# Patient Record
Sex: Female | Born: 1935 | Race: Black or African American | Hispanic: No | Smoking: Former smoker
Health system: Southern US, Community
[De-identification: ages and names within clinical notes are randomized; demographics above are authoritative.]

## PROBLEM LIST (undated history)

## (undated) DIAGNOSIS — I2510 Atherosclerotic heart disease of native coronary artery without angina pectoris: Secondary | ICD-10-CM

## (undated) DIAGNOSIS — I4891 Unspecified atrial fibrillation: Secondary | ICD-10-CM

## (undated) DIAGNOSIS — E119 Type 2 diabetes mellitus without complications: Secondary | ICD-10-CM

## (undated) DIAGNOSIS — I509 Heart failure, unspecified: Secondary | ICD-10-CM

## (undated) DIAGNOSIS — G309 Alzheimer's disease, unspecified: Secondary | ICD-10-CM

## (undated) DIAGNOSIS — J449 Chronic obstructive pulmonary disease, unspecified: Secondary | ICD-10-CM

## (undated) DIAGNOSIS — R32 Unspecified urinary incontinence: Secondary | ICD-10-CM

## (undated) DIAGNOSIS — J309 Allergic rhinitis, unspecified: Secondary | ICD-10-CM

## (undated) DIAGNOSIS — K5790 Diverticulosis of intestine, part unspecified, without perforation or abscess without bleeding: Secondary | ICD-10-CM

## (undated) DIAGNOSIS — L309 Dermatitis, unspecified: Secondary | ICD-10-CM

## (undated) DIAGNOSIS — E785 Hyperlipidemia, unspecified: Secondary | ICD-10-CM

## (undated) DIAGNOSIS — I429 Cardiomyopathy, unspecified: Secondary | ICD-10-CM

## (undated) DIAGNOSIS — I10 Essential (primary) hypertension: Secondary | ICD-10-CM

## (undated) DIAGNOSIS — F0280 Dementia in other diseases classified elsewhere without behavioral disturbance: Secondary | ICD-10-CM

## (undated) DIAGNOSIS — F0390 Unspecified dementia without behavioral disturbance: Secondary | ICD-10-CM

## (undated) HISTORY — PX: HYSTEROTOMY: SHX1776

## (undated) HISTORY — PX: OTHER SURGICAL HISTORY: SHX169

## (undated) HISTORY — PX: PACEMAKER PLACEMENT: SHX43

---

## 2003-08-29 ENCOUNTER — Other Ambulatory Visit: Payer: Self-pay

## 2003-10-19 ENCOUNTER — Other Ambulatory Visit: Payer: Self-pay

## 2003-11-14 ENCOUNTER — Other Ambulatory Visit: Payer: Self-pay

## 2003-11-17 ENCOUNTER — Other Ambulatory Visit: Payer: Self-pay

## 2003-12-25 ENCOUNTER — Other Ambulatory Visit: Payer: Self-pay

## 2004-02-16 ENCOUNTER — Emergency Department: Payer: Self-pay | Admitting: Emergency Medicine

## 2004-06-04 ENCOUNTER — Emergency Department: Payer: Self-pay | Admitting: General Practice

## 2004-12-15 ENCOUNTER — Emergency Department: Payer: Self-pay | Admitting: Emergency Medicine

## 2005-01-06 ENCOUNTER — Emergency Department: Payer: Self-pay | Admitting: Emergency Medicine

## 2005-01-06 ENCOUNTER — Other Ambulatory Visit: Payer: Self-pay

## 2005-01-12 ENCOUNTER — Emergency Department: Payer: Self-pay | Admitting: Emergency Medicine

## 2005-01-12 ENCOUNTER — Other Ambulatory Visit: Payer: Self-pay

## 2005-02-05 ENCOUNTER — Emergency Department: Payer: Self-pay | Admitting: Emergency Medicine

## 2005-02-05 ENCOUNTER — Other Ambulatory Visit: Payer: Self-pay

## 2005-02-19 ENCOUNTER — Emergency Department: Payer: Self-pay | Admitting: Emergency Medicine

## 2005-02-19 ENCOUNTER — Other Ambulatory Visit: Payer: Self-pay

## 2005-03-26 ENCOUNTER — Ambulatory Visit: Payer: Self-pay | Admitting: Family Medicine

## 2005-04-03 ENCOUNTER — Other Ambulatory Visit: Payer: Self-pay

## 2005-04-03 ENCOUNTER — Emergency Department: Payer: Self-pay | Admitting: Emergency Medicine

## 2005-05-16 ENCOUNTER — Other Ambulatory Visit: Payer: Self-pay

## 2005-05-16 ENCOUNTER — Emergency Department: Payer: Self-pay | Admitting: Emergency Medicine

## 2005-09-16 ENCOUNTER — Emergency Department: Payer: Self-pay | Admitting: Emergency Medicine

## 2005-09-17 ENCOUNTER — Other Ambulatory Visit: Payer: Self-pay

## 2005-12-15 ENCOUNTER — Other Ambulatory Visit: Payer: Self-pay

## 2005-12-15 ENCOUNTER — Emergency Department: Payer: Self-pay | Admitting: Unknown Physician Specialty

## 2006-03-19 ENCOUNTER — Ambulatory Visit: Payer: Self-pay | Admitting: Family Medicine

## 2006-10-02 ENCOUNTER — Other Ambulatory Visit: Payer: Self-pay

## 2006-10-02 ENCOUNTER — Inpatient Hospital Stay: Payer: Self-pay | Admitting: Internal Medicine

## 2007-01-14 ENCOUNTER — Emergency Department: Payer: Self-pay | Admitting: Emergency Medicine

## 2007-03-22 ENCOUNTER — Emergency Department: Payer: Self-pay | Admitting: Internal Medicine

## 2007-03-22 ENCOUNTER — Other Ambulatory Visit: Payer: Self-pay

## 2007-07-15 IMAGING — CR DG CHEST 2V
1 series · 2 of 2 positions shown · non-contrast
Comparison: none

REASON FOR EXAM: Pain
COMMENTS:

[Series 1241: postero_anterior · 0.11mm/px · 2 of 2 slices shown]
[im 1/2]
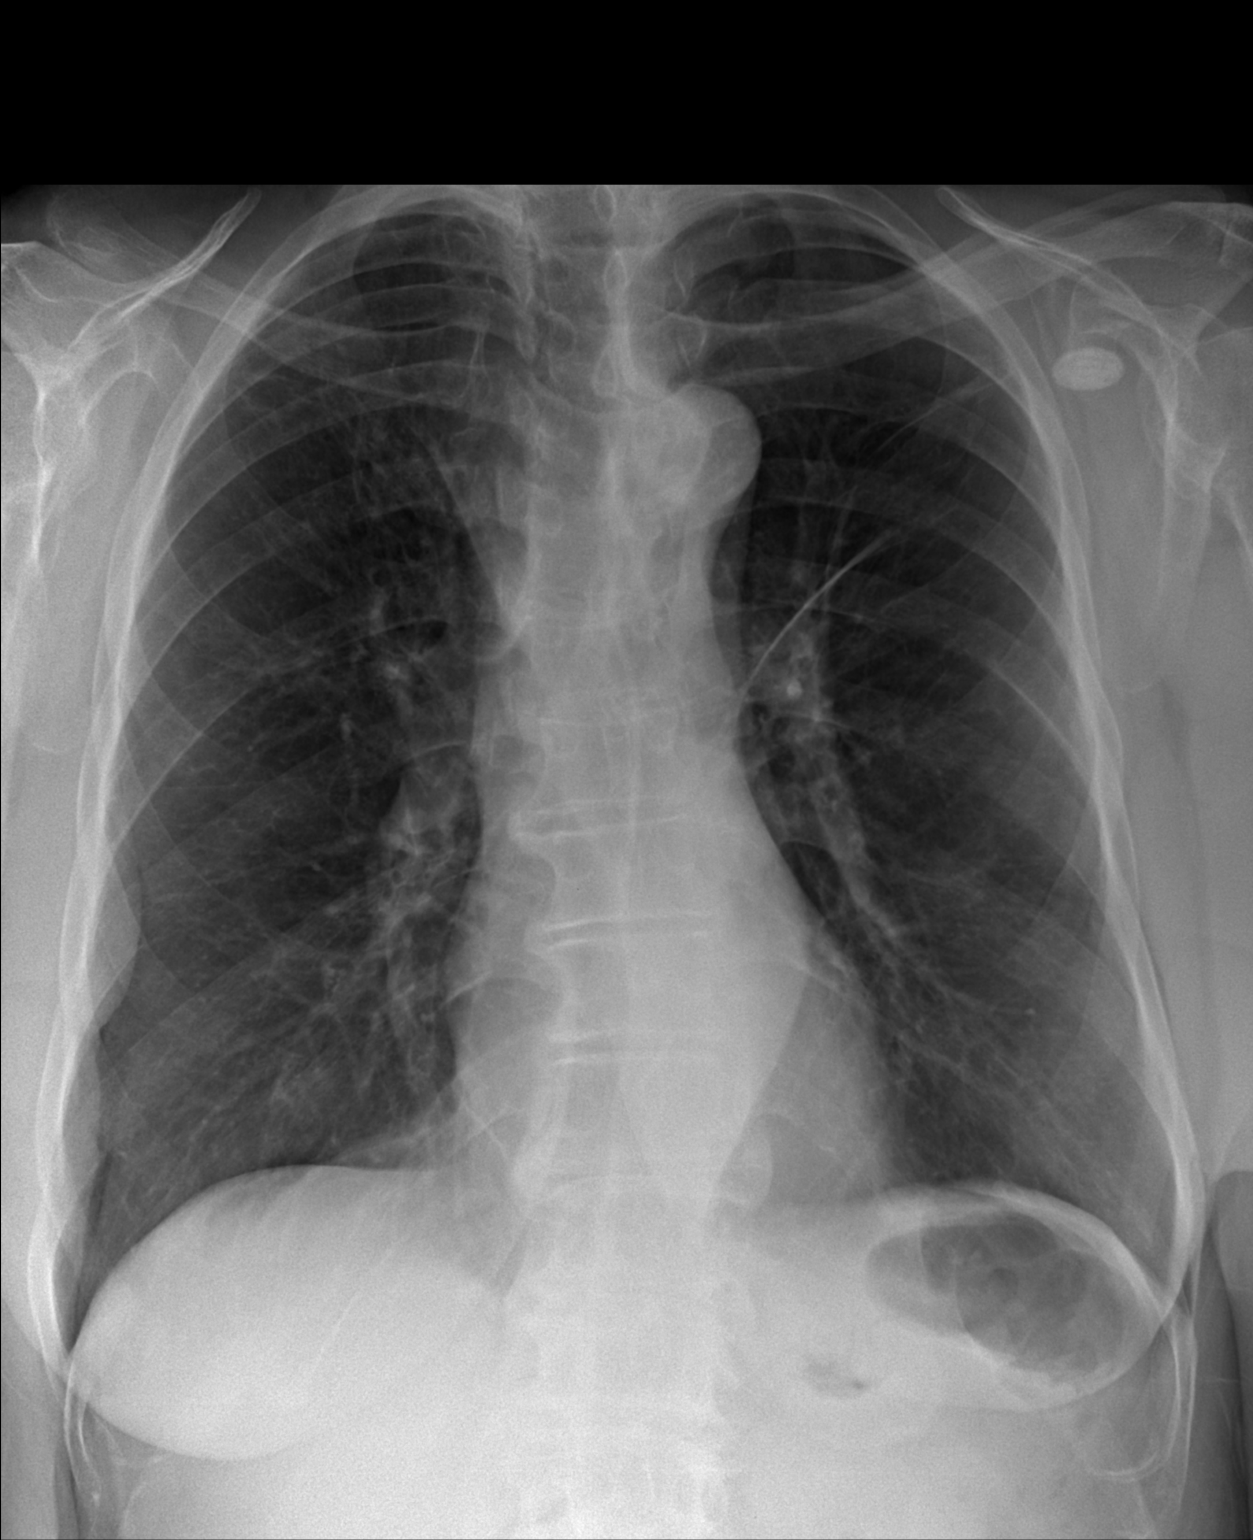
[im 2/2]
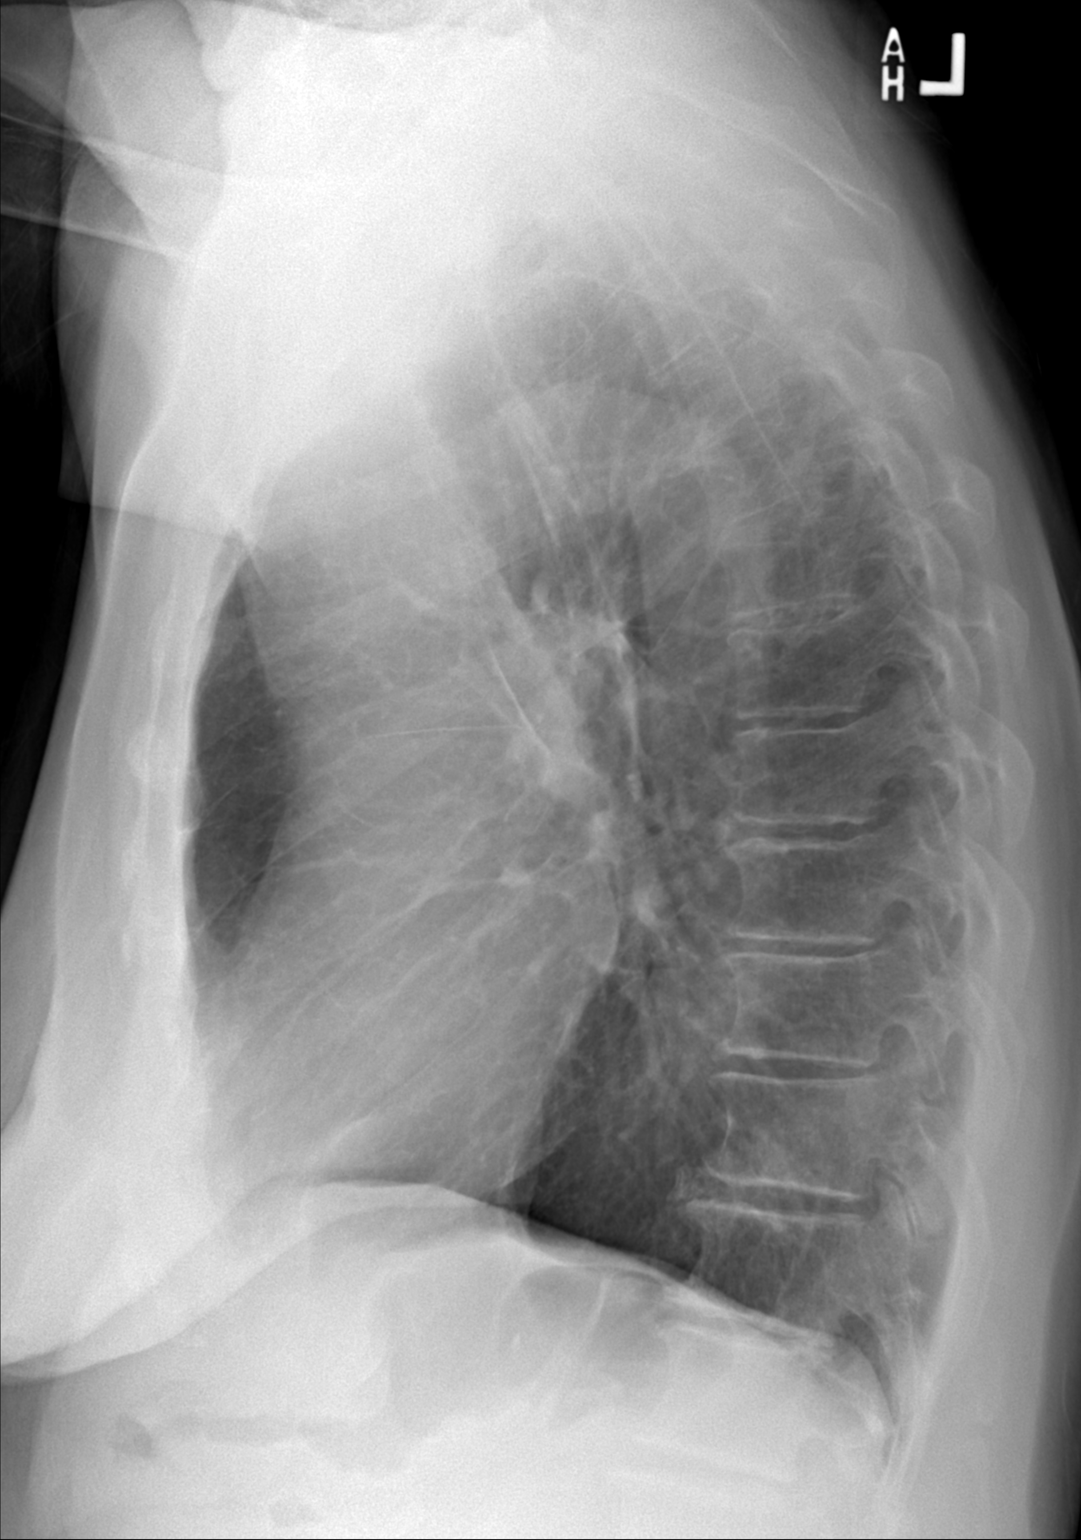

[2 of 2 positions shown; findings below may reference images not displayed]

PROCEDURE:     DXR - DXR CHEST PA (OR AP) AND LATERAL  - January 06, 2005  [DATE]

RESULT:     The current exam is compared to a prior exam of 02/16/2004.

There are again noted linear densities in the LEFT upper lobe compatible
with fibrotic strands. The lungs appear mildly hyperexpanded suspicious for
a history of COPD or asthma. No acute pulmonary infiltrates are seen. There
is noted soft tissue density along the RIGHT lateral hemithorax but this is
thought to be secondary to soft tissue from the arm which is hanging
adjacent to the chest. The heart size is normal. No acute bony abnormalities
are seen.
IMPRESSION: No acute changes are identified.

## 2008-03-02 ENCOUNTER — Emergency Department: Payer: Self-pay | Admitting: Emergency Medicine

## 2008-10-30 ENCOUNTER — Emergency Department: Payer: Self-pay | Admitting: Emergency Medicine

## 2008-12-26 ENCOUNTER — Inpatient Hospital Stay: Payer: Self-pay | Admitting: Internal Medicine

## 2009-01-12 ENCOUNTER — Emergency Department: Payer: Self-pay | Admitting: Internal Medicine

## 2009-01-19 ENCOUNTER — Emergency Department: Payer: Self-pay | Admitting: Emergency Medicine

## 2009-03-04 ENCOUNTER — Emergency Department: Payer: Self-pay | Admitting: Emergency Medicine

## 2009-07-17 ENCOUNTER — Emergency Department: Payer: Self-pay | Admitting: Internal Medicine

## 2009-08-27 ENCOUNTER — Emergency Department: Payer: Self-pay | Admitting: Emergency Medicine

## 2010-04-08 ENCOUNTER — Ambulatory Visit: Payer: Self-pay | Admitting: Family

## 2010-05-28 ENCOUNTER — Ambulatory Visit: Payer: Self-pay | Admitting: Family

## 2010-08-29 ENCOUNTER — Inpatient Hospital Stay: Payer: Self-pay | Admitting: Psychiatry

## 2011-07-21 ENCOUNTER — Ambulatory Visit: Payer: Self-pay | Admitting: Orthopedic Surgery

## 2012-09-16 ENCOUNTER — Ambulatory Visit: Payer: Self-pay | Admitting: Family Medicine

## 2013-04-09 ENCOUNTER — Emergency Department: Payer: Self-pay | Admitting: Emergency Medicine

## 2013-06-23 ENCOUNTER — Ambulatory Visit: Payer: Self-pay | Admitting: Family Medicine

## 2013-09-25 ENCOUNTER — Emergency Department: Payer: Self-pay | Admitting: Emergency Medicine

## 2013-10-06 ENCOUNTER — Ambulatory Visit: Payer: Self-pay | Admitting: Family Medicine

## 2013-10-10 ENCOUNTER — Ambulatory Visit: Payer: Self-pay | Admitting: Family Medicine

## 2014-02-05 ENCOUNTER — Ambulatory Visit: Payer: Self-pay | Admitting: Family Medicine

## 2015-12-09 ENCOUNTER — Observation Stay (HOSPITAL_BASED_OUTPATIENT_CLINIC_OR_DEPARTMENT_OTHER)
Admit: 2015-12-09 | Discharge: 2015-12-09 | Disposition: A | Discharge status: Home / Self Care | Payer: Medicare Other | Attending: Internal Medicine | Admitting: Internal Medicine

## 2015-12-09 ENCOUNTER — Emergency Department: Payer: Medicare Other

## 2015-12-09 ENCOUNTER — Encounter: Payer: Self-pay | Admitting: Emergency Medicine

## 2015-12-09 ENCOUNTER — Observation Stay
Admission: EM | Admit: 2015-12-09 | Discharge: 2015-12-11 | Payer: Medicare Other | Attending: Internal Medicine | Admitting: Internal Medicine

## 2015-12-09 DIAGNOSIS — Z7901 Long term (current) use of anticoagulants: Secondary | ICD-10-CM | POA: Insufficient documentation

## 2015-12-09 DIAGNOSIS — I700 Atherosclerosis of aorta: Secondary | ICD-10-CM | POA: Insufficient documentation

## 2015-12-09 DIAGNOSIS — Z90710 Acquired absence of both cervix and uterus: Secondary | ICD-10-CM | POA: Diagnosis not present

## 2015-12-09 DIAGNOSIS — I110 Hypertensive heart disease with heart failure: Secondary | ICD-10-CM | POA: Diagnosis not present

## 2015-12-09 DIAGNOSIS — Z7984 Long term (current) use of oral hypoglycemic drugs: Secondary | ICD-10-CM | POA: Diagnosis not present

## 2015-12-09 DIAGNOSIS — E785 Hyperlipidemia, unspecified: Secondary | ICD-10-CM | POA: Insufficient documentation

## 2015-12-09 DIAGNOSIS — I480 Paroxysmal atrial fibrillation: Secondary | ICD-10-CM | POA: Insufficient documentation

## 2015-12-09 DIAGNOSIS — Z7952 Long term (current) use of systemic steroids: Secondary | ICD-10-CM | POA: Insufficient documentation

## 2015-12-09 DIAGNOSIS — M7989 Other specified soft tissue disorders: Secondary | ICD-10-CM

## 2015-12-09 DIAGNOSIS — M25561 Pain in right knee: Secondary | ICD-10-CM | POA: Diagnosis not present

## 2015-12-09 DIAGNOSIS — Z950 Presence of cardiac pacemaker: Secondary | ICD-10-CM | POA: Insufficient documentation

## 2015-12-09 DIAGNOSIS — J449 Chronic obstructive pulmonary disease, unspecified: Secondary | ICD-10-CM | POA: Diagnosis not present

## 2015-12-09 DIAGNOSIS — Z79899 Other long term (current) drug therapy: Secondary | ICD-10-CM | POA: Insufficient documentation

## 2015-12-09 DIAGNOSIS — Z66 Do not resuscitate: Secondary | ICD-10-CM | POA: Diagnosis not present

## 2015-12-09 DIAGNOSIS — F0280 Dementia in other diseases classified elsewhere without behavioral disturbance: Secondary | ICD-10-CM | POA: Diagnosis not present

## 2015-12-09 DIAGNOSIS — E119 Type 2 diabetes mellitus without complications: Secondary | ICD-10-CM | POA: Diagnosis not present

## 2015-12-09 DIAGNOSIS — R000 Tachycardia, unspecified: Secondary | ICD-10-CM

## 2015-12-09 DIAGNOSIS — I471 Supraventricular tachycardia: Principal | ICD-10-CM | POA: Insufficient documentation

## 2015-12-09 DIAGNOSIS — R079 Chest pain, unspecified: Secondary | ICD-10-CM | POA: Diagnosis not present

## 2015-12-09 DIAGNOSIS — G309 Alzheimer's disease, unspecified: Secondary | ICD-10-CM | POA: Insufficient documentation

## 2015-12-09 DIAGNOSIS — I429 Cardiomyopathy, unspecified: Secondary | ICD-10-CM | POA: Insufficient documentation

## 2015-12-09 DIAGNOSIS — K449 Diaphragmatic hernia without obstruction or gangrene: Secondary | ICD-10-CM | POA: Diagnosis not present

## 2015-12-09 DIAGNOSIS — I2510 Atherosclerotic heart disease of native coronary artery without angina pectoris: Secondary | ICD-10-CM | POA: Insufficient documentation

## 2015-12-09 DIAGNOSIS — Z8249 Family history of ischemic heart disease and other diseases of the circulatory system: Secondary | ICD-10-CM | POA: Diagnosis not present

## 2015-12-09 DIAGNOSIS — J309 Allergic rhinitis, unspecified: Secondary | ICD-10-CM | POA: Insufficient documentation

## 2015-12-09 DIAGNOSIS — I509 Heart failure, unspecified: Secondary | ICD-10-CM | POA: Diagnosis not present

## 2015-12-09 DIAGNOSIS — Z87891 Personal history of nicotine dependence: Secondary | ICD-10-CM | POA: Diagnosis not present

## 2015-12-09 HISTORY — DX: Cardiomyopathy, unspecified: I42.9

## 2015-12-09 HISTORY — DX: Diverticulosis of intestine, part unspecified, without perforation or abscess without bleeding: K57.90

## 2015-12-09 HISTORY — DX: Allergic rhinitis, unspecified: J30.9

## 2015-12-09 HISTORY — DX: Hyperlipidemia, unspecified: E78.5

## 2015-12-09 HISTORY — DX: Type 2 diabetes mellitus without complications: E11.9

## 2015-12-09 HISTORY — DX: Essential (primary) hypertension: I10

## 2015-12-09 HISTORY — DX: Heart failure, unspecified: I50.9

## 2015-12-09 HISTORY — DX: Unspecified dementia, unspecified severity, without behavioral disturbance, psychotic disturbance, mood disturbance, and anxiety: F03.90

## 2015-12-09 HISTORY — DX: Alzheimer's disease, unspecified: G30.9

## 2015-12-09 HISTORY — DX: Unspecified atrial fibrillation: I48.91

## 2015-12-09 HISTORY — DX: Atherosclerotic heart disease of native coronary artery without angina pectoris: I25.10

## 2015-12-09 HISTORY — DX: Dermatitis, unspecified: L30.9

## 2015-12-09 HISTORY — DX: Chronic obstructive pulmonary disease, unspecified: J44.9

## 2015-12-09 HISTORY — DX: Dementia in other diseases classified elsewhere without behavioral disturbance: F02.80

## 2015-12-09 HISTORY — DX: Unspecified urinary incontinence: R32

## 2015-12-09 LAB — COMPREHENSIVE METABOLIC PANEL
ALT: 16 U/L (ref 14–54)
AST: 30 U/L (ref 15–41)
Albumin: 3.7 g/dL (ref 3.5–5.0)
Alkaline Phosphatase: 104 U/L (ref 38–126)
Anion gap: 7 (ref 5–15)
BUN: 8 mg/dL (ref 6–20)
CO2: 27 mmol/L (ref 22–32)
Calcium: 8.8 mg/dL — ABNORMAL LOW (ref 8.9–10.3)
Chloride: 103 mmol/L (ref 101–111)
Creatinine, Ser: 0.53 mg/dL (ref 0.44–1.00)
GFR calc Af Amer: >60 mL/min (ref >60)
GFR calc non Af Amer: >60 mL/min (ref >60)
Glucose, Bld: 195 mg/dL — ABNORMAL HIGH (ref 65–99)
Potassium: 4.5 mmol/L (ref 3.5–5.1)
Sodium: 137 mmol/L (ref 135–145)
Total Bilirubin: 1.4 mg/dL — ABNORMAL HIGH (ref 0.3–1.2)
Total Protein: 6.6 g/dL (ref 6.5–8.1)

## 2015-12-09 LAB — GLUCOSE, CAPILLARY: Glucose-Capillary: 235 mg/dL — ABNORMAL HIGH (ref 65–99)

## 2015-12-09 LAB — CBC
HCT: 38.8 % (ref 35.0–47.0)
HEMOGLOBIN: 13.6 g/dL (ref 12.0–16.0)
MCH: 35.4 pg — AB (ref 26.0–34.0)
MCHC: 35 g/dL (ref 32.0–36.0)
MCV: 100.9 fL — ABNORMAL HIGH (ref 80.0–100.0)
Platelets: 177 10*3/uL (ref 150–440)
RBC: 3.84 MIL/uL (ref 3.80–5.20)
RDW: 12.8 % (ref 11.5–14.5)
WBC: 4.8 10*3/uL (ref 3.6–11.0)

## 2015-12-09 LAB — TROPONIN I
Troponin I: <0.03 ng/mL (ref <0.03)
Troponin I: <0.03 ng/mL (ref <0.03)

## 2015-12-09 LAB — URINALYSIS COMPLETE WITH MICROSCOPIC (ARMC ONLY)
Bilirubin Urine: NEGATIVE
Glucose, UA: NEGATIVE mg/dL
Ketones, ur: NEGATIVE mg/dL
Leukocytes, UA: NEGATIVE
Nitrite: NEGATIVE
Protein, ur: NEGATIVE mg/dL
Specific Gravity, Urine: 1.018 (ref 1.005–1.030)
pH: 7 (ref 5.0–8.0)

## 2015-12-09 LAB — TSH: TSH: 1.987 u[IU]/mL (ref 0.350–4.500)

## 2015-12-09 LAB — MRSA PCR SCREENING: MRSA by PCR: NEGATIVE

## 2015-12-09 MED ORDER — METOPROLOL TARTRATE 5 MG/5ML IV SOLN
5.0000 mg | INTRAVENOUS | Status: DC | PRN
  Administered 2015-12-09 – 2015-12-10 (×2): 5 mg via INTRAVENOUS
  Filled 2015-12-09 (×2): qty 5

## 2015-12-09 MED ORDER — HYDRALAZINE HCL 20 MG/ML IJ SOLN
INTRAMUSCULAR | Status: AC
  Administered 2015-12-09: 10 mg via INTRAVENOUS
  Filled 2015-12-09: qty 1

## 2015-12-09 MED ORDER — FLUTICASONE PROPIONATE 50 MCG/ACT NA SUSP
2.0000 | Freq: Every day | NASAL | Status: DC
  Administered 2015-12-10 – 2015-12-11 (×2): 2 via NASAL
  Filled 2015-12-09: qty 16

## 2015-12-09 MED ORDER — SODIUM CHLORIDE 0.9% FLUSH: 3.0000 mL | INTRAVENOUS | Status: DC | PRN

## 2015-12-09 MED ORDER — PANTOPRAZOLE SODIUM 40 MG PO TBEC
40.0000 mg | DELAYED_RELEASE_TABLET | Freq: Every day | ORAL | Status: DC
Start: 2015-12-09 — End: 2015-12-11
  Administered 2015-12-10 – 2015-12-11 (×2): 40 mg via ORAL
  Filled 2015-12-09 (×2): qty 1

## 2015-12-09 MED ORDER — ENOXAPARIN SODIUM 40 MG/0.4ML ~~LOC~~ SOLN: 40.0000 mg | SUBCUTANEOUS | Status: DC

## 2015-12-09 MED ORDER — SODIUM CHLORIDE 0.9 % IV BOLUS (SEPSIS)
1000.0000 mL | Freq: Once | INTRAVENOUS | Status: AC
  Administered 2015-12-09: 1000 mL via INTRAVENOUS

## 2015-12-09 MED ORDER — SODIUM CHLORIDE 0.9 % IV SOLN: 250.0000 mL | INTRAVENOUS | Status: DC | PRN

## 2015-12-09 MED ORDER — SODIUM CHLORIDE 0.9% FLUSH
3.0000 mL | Freq: Two times a day (BID) | INTRAVENOUS | Status: DC
  Administered 2015-12-09 – 2015-12-11 (×4): 3 mL via INTRAVENOUS

## 2015-12-09 MED ORDER — TRAMADOL HCL 50 MG PO TABS
50.0000 mg | ORAL_TABLET | Freq: Three times a day (TID) | ORAL | Status: DC
  Administered 2015-12-09 – 2015-12-10 (×2): 50 mg via ORAL
  Filled 2015-12-09 (×2): qty 1

## 2015-12-09 MED ORDER — CARVEDILOL 6.25 MG PO TABS
6.2500 mg | ORAL_TABLET | Freq: Two times a day (BID) | ORAL | Status: DC
  Administered 2015-12-10: 6.25 mg via ORAL
  Filled 2015-12-09: qty 1

## 2015-12-09 MED ORDER — HYDRALAZINE HCL 20 MG/ML IJ SOLN
10.0000 mg | Freq: Once | INTRAMUSCULAR | Status: AC
  Administered 2015-12-09: 10 mg via INTRAVENOUS

## 2015-12-09 MED ORDER — SODIUM CHLORIDE 0.9% FLUSH
3.0000 mL | Freq: Two times a day (BID) | INTRAVENOUS | Status: DC
  Administered 2015-12-11: 3 mL via INTRAVENOUS

## 2015-12-09 MED ORDER — GLIPIZIDE 5 MG PO TABS
5.0000 mg | ORAL_TABLET | Freq: Every day | ORAL | Status: DC
  Administered 2015-12-10 – 2015-12-11 (×2): 5 mg via ORAL
  Filled 2015-12-09 (×2): qty 1

## 2015-12-09 MED ORDER — SODIUM CHLORIDE 0.9 % IV BOLUS (SEPSIS): 1000.0000 mL | Freq: Once | INTRAVENOUS | Status: DC

## 2015-12-09 MED ORDER — APIXABAN 2.5 MG PO TABS
2.5000 mg | ORAL_TABLET | Freq: Two times a day (BID) | ORAL | Status: DC
  Administered 2015-12-09 – 2015-12-10 (×3): 2.5 mg via ORAL
  Filled 2015-12-09 (×4): qty 1

## 2015-12-09 MED ORDER — IOPAMIDOL (ISOVUE-370) INJECTION 76%
75.0000 mL | Freq: Once | INTRAVENOUS | Status: AC | PRN
  Administered 2015-12-09: 75 mL via INTRAVENOUS

## 2015-12-09 MED ORDER — MONTELUKAST SODIUM 10 MG PO TABS
10.0000 mg | ORAL_TABLET | Freq: Every day | ORAL | Status: DC
  Administered 2015-12-09 – 2015-12-10 (×2): 10 mg via ORAL
  Filled 2015-12-09 (×2): qty 1

## 2015-12-09 MED ORDER — INSULIN ASPART 100 UNIT/ML ~~LOC~~ SOLN
0.0000 [IU] | Freq: Three times a day (TID) | SUBCUTANEOUS | Status: DC
  Administered 2015-12-10 – 2015-12-11 (×4): 1 [IU] via SUBCUTANEOUS
  Filled 2015-12-09 (×4): qty 1

## 2015-12-09 NOTE — Care Management Obs Status (Signed)
MEDICARE OBSERVATION STATUS NOTIFICATION   Patient Details  Name: [name] [name] MRN: [id number] Date of Birth: [birth year]   Medicare Observation Status Notification Given:  Yes reviewed via phone with legal guardian Berta Minor, RN 12/09/2015, 4:20 PM

## 2015-12-09 NOTE — ED Notes (Signed)
Patient transported to Ultrasound 

## 2015-12-09 NOTE — H&P (Signed)
[facility] - [place] at [facility]   PATIENT NAME: [name] [name]    MR#:  [id number]  DATE OF BIRTH:  1929-06-17  DATE OF ADMISSION:  12/09/2015  PRIMARY CARE PHYSICIAN: No PCP Per Patient   REQUESTING/REFERRING PHYSICIAN: Minna Antis MD  CHIEF COMPLAINT:   Chief Complaint  Patient presents with  . Chest Pain    HISTORY OF PRESENT ILLNESS: [name] [name]  is a 82 y.o. female with a known history of  Alzheimer's dementia, , congestive heart failure, diabetes, urinary incontinenceAnd paroxysmal atrial fibrillation and history of pacemaker who is sent from the place where she stays because apparently patient was complaining of some chest pain. Currently she denies any complaints. However patient heart rate has been intermittently elevated into the 140s. Therefore she is being admitted. Patient has dementia and unable to provide any review of systems or history. She did have a CT scan of the chest which was negative for PE as well as Doppler of lower extremity negative for DVT. She is currently on Eliquis.     PAST MEDICAL HISTORY:   Past Medical History:  Diagnosis Date  . A-fib (HCC)   . Allergic rhinitis   . Alzheimer disease   . CAD (coronary artery disease)   . Cardiomyopathy (HCC)   . CHF (congestive heart failure) (HCC)   . COPD (chronic obstructive pulmonary disease) (HCC)   . Dementia   . Diabetes mellitus without complication (HCC)   . Diverticulosis   . Eczema   . Hyperlipemia   . Hypertension   . Urinary incontinence     PAST SURGICAL HISTORY:  Past Surgical History:  Procedure Laterality Date  . HYSTEROTOMY    . none    . PACEMAKER PLACEMENT      SOCIAL HISTORY:  Social History  Substance Use Topics  . Smoking status: Former Games developer  . Smokeless tobacco: Former Neurosurgeon  . Alcohol use No    FAMILY HISTORY:  Family History  Problem Relation Age of Onset  . Hypertension Father     DRUG ALLERGIES: No Known Allergies  REVIEW OF  SYSTEMS:   CONSTITUTIONAL: Unable to provide due to her dementia  MEDICATIONS AT HOME:  Fexofenadine 180 mg daily Fluticasone 1 spray in each nostril daily Lasix 40 one tab daily Loratadine 10 mg daily Glipizide 5 mg daily Singulair 10 mg daily Omeprazole 20 one tab by mouth daily Potassium chloride 20 mg 1 tab by mouth daily Coreg 3.125 one tab by mouth twice a day Ella Quist 2.5 mg 1 tab by mouth twice a day Final 500 mg 1 tab by mouth twice a day Tramadol 50 one tab by mouth 3 times a day Atorvastatin 40 one tab by mouth daily    PHYSICAL EXAMINATION:   VITAL SIGNS: Blood pressure (!) 128/114, pulse (!) 113, temperature 97.8 F (36.6 C), temperature source Oral, resp. rate 14, height 5\' 2"  (1.575 m), weight 63.5 kg (140 lb), SpO2 98 %.  GENERAL:  82 y.o.-year-old patient lying in the bed with no acute distress.  EYES: Pupils equal, round, reactive to light and accommodation. No scleral icterus. Extraocular muscles intact.  HEENT: Head atraumatic, normocephalic. Oropharynx and nasopharynx clear.  NECK:  Supple, no jugular venous distention. No thyroid enlargement, no tenderness.  LUNGS: Normal breath sounds bilaterally, no wheezing, rales,rhonchi or crepitation. No use of accessory muscles of respiration.  CARDIOVASCULAR: S1, S2 normal. No murmurs, rubs, or gallops.  ABDOMEN: Soft, nontender, nondistended. Bowel sounds present. No organomegaly  or mass.  EXTREMITIES: No pedal edema, cyanosis, or clubbing.  NEUROLOGIC: Cranial nerves II through XII are intact. Limited due to patient unable to follow commands PSYCHIATRIC: She is awake but confused to place person time  SKIN: No obvious rash, lesion, or ulcer.   LABORATORY PANEL:   CBC  Recent Labs Lab 12/09/15 0938  WBC 4.8  HGB 13.6  HCT 38.8  PLT 177  MCV 100.9*  MCH 35.4*  MCHC 35.0  RDW 12.8    ------------------------------------------------------------------------------------------------------------------  Chemistries   Recent Labs Lab 12/09/15 0938  NA 137  K 4.5  CL 103  CO2 27  GLUCOSE 195*  BUN 8  CREATININE 0.53  CALCIUM 8.8*  AST 30  ALT 16  ALKPHOS 104  BILITOT 1.4*   ------------------------------------------------------------------------------------------------------------------ estimated creatinine clearance is 45.9 mL/min (by C-G formula based on SCr of 0.8 mg/dL). ------------------------------------------------------------------------------------------------------------------ No results for input(s): TSH, T4TOTAL, T3FREE, THYROIDAB in the last 72 hours.  Invalid input(s): FREET3   Coagulation profile No results for input(s): INR, PROTIME in the last 168 hours. ------------------------------------------------------------------------------------------------------------------- No results for input(s): DDIMER in the last 72 hours. -------------------------------------------------------------------------------------------------------------------  Cardiac Enzymes  Recent Labs Lab 12/09/15 0938  TROPONINI <0.03   ------------------------------------------------------------------------------------------------------------------ Invalid input(s): POCBNP  ---------------------------------------------------------------------------------------------------------------  Urinalysis    Component Value Date/Time   COLORURINE STRAW (A) 12/09/2015 1355   APPEARANCEUR CLEAR (A) 12/09/2015 1355   LABSPEC 1.018 12/09/2015 1355   PHURINE 7.0 12/09/2015 1355   GLUCOSEU NEGATIVE 12/09/2015 1355   HGBUR 1+ (A) 12/09/2015 1355   BILIRUBINUR NEGATIVE 12/09/2015 1355   KETONESUR NEGATIVE 12/09/2015 1355   PROTEINUR NEGATIVE 12/09/2015 1355   NITRITE NEGATIVE 12/09/2015 1355   LEUKOCYTESUR NEGATIVE 12/09/2015 1355     RADIOLOGY: Ct Angio Chest Pe W And/or  Wo Contrast  Result Date: 12/09/2015 CLINICAL DATA:  Tachycardia. EXAM: CT ANGIOGRAPHY CHEST WITH CONTRAST TECHNIQUE: Multidetector CT imaging of the chest was performed using the standard protocol during bolus administration of intravenous contrast. Multiplanar CT image reconstructions and MIPs were obtained to evaluate the vascular anatomy. CONTRAST:  75 cc Isovue 370 IV COMPARISON:  10/31/2008 FINDINGS: Cardiovascular: No filling defects in the pulmonary arteries to suggest pulmonary emboli. Mild cardiomegaly. Pacer in place with single lead in the right ventricle. Scattered aortic calcifications. No aneurysm. Mediastinum/Nodes: Moderate sized hiatal hernia. No mediastinal, hilar, or axillary adenopathy. Lungs/Pleura: Biapical scarring. No confluent airspace opacities. No pleural effusions. Upper Abdomen: Imaging into the upper abdomen shows no acute findings. Small hypodensities scattered through the liver, likely cysts. Musculoskeletal: Chest wall soft tissues are unremarkable. No acute bony abnormality. Degenerative changes in the thoracic spine. Review of the MIP images confirms the above findings. IMPRESSION: No evidence of pulmonary embolus. Mild cardiomegaly. Moderate-sized hiatal hernia. Biapical scarring. Aortic atherosclerosis. Electronically Signed   By: Charlett Nose M.D.   On: 12/09/2015 11:36  US Venous Img Lower Unilateral Right  Result Date: 12/09/2015 CLINICAL DATA:  Right lower extremity pain and swelling. Evaluate for pulmonary embolism. EXAM: RIGHT LOWER EXTREMITY VENOUS DOPPLER ULTRASOUND TECHNIQUE: [name]-scale sonography with graded compression, as well as color Doppler and duplex ultrasound were performed to evaluate the lower extremity deep venous systems from the level of the common femoral vein and including the common femoral, femoral, profunda femoral, popliteal and calf veins including the posterior tibial, peroneal and gastrocnemius veins when visible. The superficial great  saphenous vein was also interrogated. Spectral Doppler was utilized to evaluate flow at rest and with distal augmentation maneuvers in the common femoral, femoral and  popliteal veins. COMPARISON:  None. FINDINGS: Contralateral Common Femoral Vein: Respiratory phasicity is normal and symmetric with the symptomatic side. No evidence of thrombus. Normal compressibility. Note is made of mild pulsatility involving the left common femoral vein. Common Femoral Vein: No evidence of thrombus. Normal compressibility, respiratory phasicity and response to augmentation. Note is made of mild pulsatility involving the right common femoral vein. Saphenofemoral Junction: No evidence of thrombus. Normal compressibility and flow on color Doppler imaging. Profunda Femoral Vein: No evidence of thrombus. Normal compressibility and flow on color Doppler imaging. Femoral Vein: No evidence of thrombus. Normal compressibility, respiratory phasicity and response to augmentation. Popliteal Vein: No evidence of thrombus. Normal compressibility, respiratory phasicity and response to augmentation. Calf Veins: No evidence of thrombus. Normal compressibility and flow on color Doppler imaging. Superficial Great Saphenous Vein: No evidence of thrombus. Normal compressibility and flow on color Doppler imaging. Note is made of mild pulsatility involving the right superficial femoral vein. Venous Reflux:  None. Other Findings:  None. IMPRESSION: 1. No evidence of DVT within the right lower extremity. 2. Mild pulsatility involving the bilateral common femoral veins, nonspecific though could be seen in the setting of right-sided heart failure. Clinical correlation is advised. Electronically Signed   By: Simonne Come M.D.   On: 12/09/2015 10:58   EKG: Orders placed or performed in visit on 09/25/13  . EKG 12-Lead    IMPRESSION AND PLAN: Patient is a 82 year old African-American female with history of paroxysmal atrial fibrillation initially  admitted with chest pain now noted to have sinus tachycardia intermittently  1. Sinus tachycardia etiology unclear patient already has a pacemaker in place Await TSH levels No  evidence of infection We'll place patient on IV Lopressor as needed Continue Coreg Cardiology consult Echocardiogram of the heart  2. Diabetes type 2 Continue oral glipizide Place patient on sliding scale insulin  3. History of paroxysmal atrial fibrillation Continue liquids  4. Hyperlipidemia continue atorvastatin  5. Miscellaneous Ellik was for DVT prophylaxis   All the records are reviewed and case discussed with ED provider. Management plans discussed with the patient, family and they are in agreement.  CODE STATUS:    Code Status Orders        Start     Ordered   12/09/15 1500  Full code  Continuous     12/09/15 1500    Code Status History    Date Active Date Inactive Code Status Order ID Comments User Context   This patient has a current code status but no historical code status.       TOTAL TIME TAKING CARE OF THIS PATIENT: .    Auburn Bilberry M.D on 12/09/2015 at 3:17 PM  Between 7am to 6pm - Pager - [phone]  After 6pm go to [email or web address] - password [email or web address] [facility]  [place] [facility]  Office  [phone]  CC: Primary care physician; No PCP Per Patient

## 2015-12-09 NOTE — ED Notes (Signed)
Dr Allena Katz notified of BP 143/107. 5mg  Lopressor IV  Given.

## 2015-12-09 NOTE — Progress Notes (Signed)
CSW contacted [name] [phone] to obtain information about pt for an assessment, as pt is not appropriate to complete assessment herself. No answer, left a message. Pt is being admitted. Medical CSW notified.  Jonathon Jordan, MSW, Theresia Majors [phone]

## 2015-12-09 NOTE — Care Management Obs Status (Signed)
MEDICARE OBSERVATION STATUS NOTIFICATION   Patient Details  Name: [name] [name] MRN: [id number] Date of Birth: 08-13-1929   Medicare Observation Status Notification Given:  Other (see comment) (Patient confused.  ward of the state.  I spoke with Adrain Daye Director of DSS, who stated that Lazaro Arms [phone] is patient's SW.  I have left message.  Awaiting return call, to review observation letter. )    Chapman Fitch, RN 12/09/2015, 3:30 PM

## 2015-12-09 NOTE — ED Triage Notes (Addendum)
Pt to ED via EMS from [facility] facility. Per EMS pt has been tachycardic since yesterday and pt c/o left shoulder pain. Pt 108/72, HR 90 en route per EMS, Pt has hx of dementia and pacemaker. Pt HR 127 at this time, MD at bedside . Pt A&O to self

## 2015-12-09 NOTE — Progress Notes (Signed)
CSW contacted pt's legal guardian, Lazaro Arms, [phone] to notify her that pt is currently in the Emergency Department and will be admitted to the hospital. No answer, left a message.  Jonathon Jordan, MSW, Theresia Majors [phone]

## 2015-12-09 NOTE — ED Provider Notes (Signed)
[facility] Emergency Department Provider Note  Time seen: 9:41 AM  I have reviewed the triage vital signs and the nursing notes.   HISTORY  Chief Complaint Chest Pain    HPI [name] [name] is a 82 y.o. female with a past medical history of CHF, diabetes, Alzheimer's on Eliquis, who presents the emergency department with tachycardia. Per EMS report the patient was noted to be mildly tachycardic last night, upon bile check this morning she had a heart rate in the 130s and EMS was called. Patient has a history of dementia, but denies any complaints at this time. Denies any chest pain. Denies abdominal pain. EMS did note at one point the patient complained of left shoulder pain, but has no complaints of shoulder pain presently.Patient is tachycardic upon arrival approximately 140 bpm. Has no complaints at this time.  Past Medical History:  Diagnosis Date  . Alzheimer disease   . CHF (congestive heart failure) (HCC)   . Diabetes mellitus without complication (HCC)   . Urinary incontinence     There are no active problems to display for this patient.   No past surgical history on file.  Prior to Admission medications   Not on File    No Known Allergies  No family history on file.  Social History Social History  Substance Use Topics  . Smoking status: Unknown If Ever Smoked  . Smokeless tobacco: Not on file  . Alcohol use No    Review of Systems Constitutional: Negative for fever. Cardiovascular: Negative for chest pain. Respiratory: Negative for shortness of breath. Gastrointestinal: Negative for abdominal pain Musculoskeletal: Negative for back pain. Neurological: Negative for headaches, focal weakness or numbness. 10-point ROS otherwise negative.  ____________________________________________   PHYSICAL EXAM:  VITAL SIGNS: ED Triage Vitals  Enc Vitals Group     BP 12/09/15 0929 117/79     Pulse Rate 12/09/15 0929 (!) 125     Resp  12/09/15 0929 (!) 21     Temp 12/09/15 0929 97.8 F (36.6 C)     Temp Source 12/09/15 0929 Oral     SpO2 12/09/15 0929 96 %     Weight 12/09/15 0936 140 lb (63.5 kg)     Height 12/09/15 0936 5\' 2"  (1.575 m)     Head Circumference --      Peak Flow --      Pain Score 12/09/15 0931 0     Pain Loc --      Pain Edu? --      Excl. in GC? --     Constitutional: Alert, Baseline dementia. Well appearing and in no distress. Eyes: Normal exam ENT   Head: Normocephalic and atraumatic.   Mouth/Throat: Mucous membranes are moist. Cardiovascular: Regular rhythm and rate around 130 bpm. Respiratory: Normal respiratory effort without tachypnea nor retractions. Breath sounds are clear . No wheezes rales or rhonchi. Gastrointestinal: Soft and nontender. No distention.   Musculoskeletal: Mild Tenderness palpation the right lower extremity with 1+ edema of the right lower extremity compared to trace edema in the left lower extreme. Neurologic:  Normal speech and language. No gross focal neurologic deficits are appreciated. Skin:  Skin is warm, dry and intact.  Psychiatric: Mood and affect are normal. Speech and behavior are normal.   ____________________________________________    EKG  EKG reviewed and interpreted by myself shows tachycardia at 128 bpm, narrow QRS with a normal axis, the patient had nonspecific ST changes with T-wave inversions in the lateral leads.  T-wave inversions appear to be new compared to last EKG 09/25/13.  ____________________________________________    RADIOLOGY  CT angiography negative for PE.  ____________________________________________   INITIAL IMPRESSION / ASSESSMENT AND PLAN / ED COURSE  Pertinent labs & imaging results that were available during my care of the patient were reviewed by me and considered in my medical decision making (see chart for details).  Patient presents the emergency department with tachycardia of unknown origin. Patient is  afebrile in the emergency department. Has no complaints, otherwise her vitals appear to be within normal limits. Patient does have right Lotrimin swelling greater than left lower extremity. She is on Eliquis, which should be somewhat protective for DVT/PE. However given her sinus tachycardia we'll proceed with workup including labs, urinalysis, possible CT angiography depending on kidney function as well as a right lower extremity ultrasound.    Patient with continued tachycardia. CT angiography negative. Labs are largely within normal limits including urinalysis. Patient will have a normal sinus rhythm approximate 70-80 bpm, for prolonged period and then will jump to 140 bpm which continues to appear to be sinus tachycardia. This continues to occur despite IV fluids. It is unclear the cause of the patient's intermittent sinus tachycardia. Patient will be admitted for further workup and treatment.  ____________________________________________   FINAL CLINICAL IMPRESSION(S) / ED DIAGNOSES  Sinus tachycardia    Minna Antis, MD 12/09/15 1440

## 2015-12-10 DIAGNOSIS — I471 Supraventricular tachycardia: Secondary | ICD-10-CM | POA: Diagnosis not present

## 2015-12-10 LAB — ECHOCARDIOGRAM COMPLETE
Height: 62 in
WEIGHTICAEL: 2240 [oz_av]

## 2015-12-10 LAB — GLUCOSE, CAPILLARY
GLUCOSE-CAPILLARY: 115 mg/dL — AB (ref 65–99)
GLUCOSE-CAPILLARY: 156 mg/dL — AB (ref 65–99)
Glucose-Capillary: 141 mg/dL — ABNORMAL HIGH (ref 65–99)
Glucose-Capillary: 132 mg/dL — ABNORMAL HIGH (ref 65–99)

## 2015-12-10 MED ORDER — CARVEDILOL 12.5 MG PO TABS
12.5000 mg | ORAL_TABLET | Freq: Two times a day (BID) | ORAL | Status: DC
  Administered 2015-12-10 – 2015-12-11 (×2): 12.5 mg via ORAL
  Filled 2015-12-10 (×2): qty 1

## 2015-12-10 MED ORDER — DILTIAZEM HCL 30 MG PO TABS
30.0000 mg | ORAL_TABLET | Freq: Four times a day (QID) | ORAL | Status: DC
  Administered 2015-12-10 (×2): 30 mg via ORAL
  Filled 2015-12-10 (×2): qty 1

## 2015-12-10 MED ORDER — OXYCODONE-ACETAMINOPHEN 5-325 MG PO TABS
1.0000 | ORAL_TABLET | Freq: Four times a day (QID) | ORAL | Status: DC | PRN
  Administered 2015-12-10 – 2015-12-11 (×3): 1 via ORAL
  Filled 2015-12-10 (×3): qty 1

## 2015-12-10 MED ORDER — MELOXICAM 7.5 MG PO TABS
7.5000 mg | ORAL_TABLET | Freq: Two times a day (BID) | ORAL | Status: DC | PRN
Start: 2015-12-10 — End: 2015-12-11
  Administered 2015-12-10 – 2015-12-11 (×2): 7.5 mg via ORAL
  Filled 2015-12-10 (×2): qty 1

## 2015-12-10 MED ORDER — DILTIAZEM HCL 60 MG PO TABS
60.0000 mg | ORAL_TABLET | Freq: Four times a day (QID) | ORAL | Status: DC
  Administered 2015-12-10 – 2015-12-11 (×4): 60 mg via ORAL
  Filled 2015-12-10 (×4): qty 1

## 2015-12-10 MED ORDER — ACETAMINOPHEN 325 MG PO TABS: 650.0000 mg | ORAL_TABLET | Freq: Four times a day (QID) | ORAL | Status: DC | PRN

## 2015-12-10 MED ORDER — APIXABAN 5 MG PO TABS
5.0000 mg | ORAL_TABLET | Freq: Two times a day (BID) | ORAL | Status: DC
  Administered 2015-12-10 – 2015-12-11 (×2): 5 mg via ORAL
  Filled 2015-12-10 (×2): qty 1

## 2015-12-10 MED ORDER — DILTIAZEM HCL 30 MG PO TABS
30.0000 mg | ORAL_TABLET | Freq: Once | ORAL | Status: AC
  Administered 2015-12-10: 30 mg via ORAL
  Filled 2015-12-10: qty 1

## 2015-12-10 NOTE — Care Management (Signed)
Patient active with [facility] for SN.

## 2015-12-10 NOTE — Progress Notes (Signed)
Pt complaining of knee pain, no pain medications ordered. MD Dr. Allena Katz notified, orders placed for home dose of scheduled tramadol. RN will administer and continue to monitor. Syliva Overman, RN

## 2015-12-10 NOTE — Clinical Social Work Note (Signed)
Clinical Social Work Assessment  Patient Details  Name: [name] [name] MRN: [id number] Date of Birth: [birth year]  Date of referral:  12/09/15               Reason for consult:  Facility Placement                Permission sought to share information with:  Facility Medical sales representative Permission granted to share information::  Yes, Verbal Permission Granted  Name::     Lazaro Arms  [phone] [facility] social worker and Resident Care Coordinator Ardyth Gal [phone]  Agency::  [name] [phone]   Relationship::     Contact Information:     Housing/Transportation Living arrangements for the past 2 months:  Group Home Source of Information:  Guardian, Facility Patient Interpreter Needed:  None Criminal Activity/Legal Involvement Pertinent to Current Situation/Hospitalization:  No - Comment as needed Significant Relationships:  Other(Comment) (Guardian and Resident Care coordinator) Lives with:  Facility Resident Do you feel safe going back to the place where you live?  Yes Need for family participation in patient care:  Yes (Comment)  Care giving concerns:  Patient's guardian feels like patient can return back to group home once she is ready for discharge.   Social Worker assessment / plan:  Patient is a 82 year old female female who has dementia.  MSW completed assessment by speaking with patient's resident care coordinator at group home [facility] [phone] and [facility] social worker Lazaro Arms [phone].  Patient has been under the care of the state for about 5 years, and she has been living at her current place for about 2 years.  Patient plans to return to group home once she is medically ready for discharge.  [facility] did not express any concerns with having patient return to facility.  Patient needs assistance with everything except eating which is her base line.  Employment status:  Retired Health and safety inspector:   Armed forces operational officer, Medicaid In [place] PT Recommendations:  Not assessed at this time Information / Referral to community resources:     Patient/Family's Response to care:  Patient's caregiver and social work guardian did not express any other concerns about care, they anticipate accepting her back to group home once she is medically ready for discharge.  Patient/Family's Understanding of and Emotional Response to Diagnosis, Current Treatment, and Prognosis:  Patient has dementia does not seem to express any concerns with treatment or diagnosis.  Emotional Assessment Appearance:  Appears stated age Attitude/Demeanor/Rapport:    Affect (typically observed):  Calm, Appropriate Orientation:  Oriented to Self, Oriented to Place Alcohol / Substance use:  Not Applicable Psych involvement (Current and /or in the community):  No (Comment)  Discharge Needs  Concerns to be addressed:  No discharge needs identified Readmission within the last 30 days:  No Current discharge risk:  None Barriers to Discharge:  No Barriers Identified   Darleene Cleaver, LCSWA 12/10/2015, 3:26 PM

## 2015-12-10 NOTE — Progress Notes (Signed)
Pt complaining of knee pain again, no prn pain medications ordered. MD Dr. Sheryle Hail notified. MD to place orders. RN will administer and continue to monitor. Syliva Overman, RN

## 2015-12-10 NOTE — Progress Notes (Signed)
Pt's HR has been jumping between 60s-130s all shift, recently began sustaining in the 130s. PRN IVP metoprolol given, HR now 70s-80s. VSS. RN will continue to monitor. Syliva Overman, RN

## 2015-12-10 NOTE — Clinical Social Work Note (Addendum)
MSW spoke to [place] DSS social worker Lazaro Arms [phone] and [name] Resident Care Coordinator Ardyth Gal [phone] to discuss discharge planning.  DSS social worker and Resident Care Coordinator stated that they provide most care for patient.  MSW was informed that patient uses a wheelchair to ambulate throughout her home which is her baseline.  DSS social worker and Resident Care Coordinator requested discharge summary be faxed to them once patient is medically ready for discharge and orders have been received.  DSS social worker fax number is [phone] and Resident Care Coordinator fax number is [phone].  Patient will need EMS transport once she is ready for discharge, MSW to continue to follow patient's progress throughout discharge planning.  Ervin Knack. Hassan Rowan, MSW [phone] 12/10/2015 4:05 PM

## 2015-12-10 NOTE — [place] FL2 (Signed)
[place] MEDICAID FL2 LEVEL OF CARE SCREENING TOOL     IDENTIFICATION  Patient Name: [name] [name] Birthdate: 09-05-1929 Sex: female Admission Date (Current Location): 12/09/2015  [place] and [place] Number:  [address] [id number] [facility] Facility and Address:  [facility], [address], [place], [place] [address]      Provider Number: [id number]  Attending Physician Name and Address:  Delfino Lovett, MD  Relative Name and Phone Number:  Lazaro Arms  [phone] [place] DSS Social Worker or Ardyth Gal [phone] Resident Care Coordinator    Current Level of Care: Hospital Recommended Level of Care: [facility] Prior Approval Number:    Date Approved/Denied:   PASRR Number:    Discharge Plan: Domiciliary (Rest home)    Current Diagnoses: Patient Active Problem List   Diagnosis Date Noted  . Sinus tachycardia (HCC) 12/09/2015    Orientation RESPIRATION BLADDER Height & Weight     Self  Normal Incontinent Weight: 140 lb (63.5 kg) Height:  5\' 2"  (157.5 cm)  BEHAVIORAL SYMPTOMS/MOOD NEUROLOGICAL BOWEL NUTRITION STATUS      Incontinent Diet (Carb Modified)  AMBULATORY STATUS COMMUNICATION OF NEEDS Skin   Extensive Assist Verbally Normal                       Personal Care Assistance Level of Assistance  Bathing, Dressing, Feeding Bathing Assistance: Maximum assistance Feeding assistance: Independent Dressing Assistance: Maximum assistance     Functional Limitations Info  Sight, Hearing, Speech Sight Info: Adequate Hearing Info: Adequate Speech Info: Adequate    SPECIAL CARE FACTORS FREQUENCY                       Contractures Contractures Info: Not present    Additional Factors Info  Insulin Sliding Scale, Code Status, Allergies Code Status Info: DNR Allergies Info: NKA   Insulin Sliding Scale Info: 3x a day       Current Medications (12/10/2015):  This is the current hospital active medication list Current  Facility-Administered Medications  Medication Dose Route Frequency Provider Last Rate Last Dose  . 0.9 %  sodium chloride infusion  250 mL Intravenous PRN 02/09/2016, MD      . acetaminophen (TYLENOL) tablet 650 mg  650 mg Oral Q6H PRN Auburn Bilberry, MD      . apixaban (ELIQUIS) tablet 5 mg  5 mg Oral BID Delfino Lovett, MD      . carvedilol (COREG) tablet 12.5 mg  12.5 mg Oral BID WC Delfino Lovett, MD      . diltiazem (CARDIZEM) tablet 60 mg  60 mg Oral Q6H Marcina Millard, MD      . fluticasone (FLONASE) 50 MCG/ACT nasal spray 2 spray  2 spray Each Nare Daily Marcina Millard, MD   2 spray at 12/10/15 1002  . glipiZIDE (GLUCOTROL) tablet 5 mg  5 mg Oral QAC breakfast 02/09/16, MD   5 mg at 12/10/15 0809  . insulin aspart (novoLOG) injection 0-9 Units  0-9 Units Subcutaneous TID WC 02/09/16, MD   1 Units at 12/10/15 1201  . meloxicam (MOBIC) tablet 7.5 mg  7.5 mg Oral BID PRN 02/09/16, MD   7.5 mg at 12/10/15 1327  . metoprolol (LOPRESSOR) injection 5 mg  5 mg Intravenous Q4H PRN 02/09/16, MD   5 mg at 12/10/15 0255  . montelukast (SINGULAIR) tablet 10 mg  10 mg Oral QHS 02/09/16, MD   10 mg at 12/09/15  2031  . oxyCODONE-acetaminophen (PERCOCET/ROXICET) 5-325 MG per tablet 1 tablet  1 tablet Oral Q6H PRN Arnaldo Natal, MD   1 tablet at 12/10/15 [phone]  . pantoprazole (PROTONIX) EC tablet 40 mg  40 mg Oral Daily Auburn Bilberry, MD   40 mg at 12/10/15 1002  . sodium chloride 0.9 % bolus 1,000 mL  1,000 mL Intravenous Once Minna Antis, MD      . sodium chloride flush (NS) 0.9 % injection 3 mL  3 mL Intravenous Q12H Auburn Bilberry, MD      . sodium chloride flush (NS) 0.9 % injection 3 mL  3 mL Intravenous Q12H Auburn Bilberry, MD   3 mL at 12/10/15 1003  . sodium chloride flush (NS) 0.9 % injection 3 mL  3 mL Intravenous PRN Auburn Bilberry, MD         Discharge Medications: Please see discharge summary for a list of discharge medications.  Relevant  Imaging Results:  Relevant Lab Results:   Additional Information SSN [id number]  Darleene Cleaver, [place]

## 2015-12-10 NOTE — Progress Notes (Signed)
   [employer] - [place] [facility]   PATIENT NAME: [name] [name]    MR#:  [id number]  DATE OF BIRTH:  [birth year]  SUBJECTIVE:  CHIEF COMPLAINT:   Chief Complaint  Patient presents with  . Chest Pain  pleasantly confused. C/o knee pain, HR in 110s REVIEW OF SYSTEMS:  Review of Systems  Unable to perform ROS: Dementia   DRUG ALLERGIES:  No Known Allergies VITALS:  Blood pressure 109/70, pulse (!) 119, temperature 98.2 F (36.8 C), temperature source Oral, resp. rate 18, height 5\' 2"  (1.575 m), weight 63.5 kg (140 lb), SpO2 100 %. PHYSICAL EXAMINATION:  Physical Exam  Constitutional: She is well-developed, well-nourished, and in no distress.  HENT:  Head: Normocephalic and atraumatic.  Eyes: Conjunctivae and EOM are normal. Pupils are equal, round, and reactive to light.  Neck: Normal range of motion. Neck supple. No tracheal deviation present. No thyromegaly present.  Cardiovascular: Normal rate, regular rhythm and normal heart sounds.   Pulmonary/Chest: Effort normal and breath sounds normal. No respiratory distress. She has no wheezes. She exhibits no tenderness.  Abdominal: Soft. Bowel sounds are normal. She exhibits no distension. There is no tenderness.  Musculoskeletal: Normal range of motion.  Neurological: She is alert. She is disoriented. No cranial nerve deficit.  Confused  Skin: Skin is warm and dry. No rash noted.  Psychiatric: Mood and affect normal.  Confused  Nursing note and vitals reviewed.  LABORATORY PANEL:   CBC  Recent Labs Lab 12/09/15 0938  WBC 4.8  HGB 13.6  HCT 38.8  PLT 177   ------------------------------------------------------------------------------------------------------------------ Chemistries   Recent Labs Lab 12/09/15 0938  NA 137  K 4.5  CL 103  CO2 27  GLUCOSE 195*  BUN 8  CREATININE 0.53  CALCIUM 8.8*  AST 30  ALT 16  ALKPHOS 104  BILITOT 1.4*   RADIOLOGY:  No results found. ASSESSMENT AND  PLAN:  Patient is a 82 year old African-American female with history of paroxysmal atrial fibrillation initially admitted with chest pain now noted to have sinus tachycardia intermittently  1. Sinus tachycardia etiology unclear patient already has a pacemaker in place - could be due to knee pain Normal TSH levels No evidence of infection Continue Coreg And add Cardizem Cardiology consult Echocardiogram Pending  2. Diabetes type 2 Continue oral glipizide - Continue sliding scale insulin  3. History of paroxysmal atrial fibrillation Continue Eliquis - dose adjusted per pharmacy  4. Hyperlipidemia continue atorvastatin      All the records are reviewed and case discussed with Care Management/Social Worker. Management plans discussed with the patient, family and they are in agreement.  CODE STATUS: DO NOT RESUSCITATE  TOTAL TIME TAKING CARE OF THIS PATIENT: 35 minutes.   More than 50% of the time was spent in counseling/coordination of care: YES  POSSIBLE D/C IN 1-2 DAYS, DEPENDING ON CLINICAL CONDITION.   [facility], Krysia Zahradnik M.D on 12/10/2015 at 3:11 PM  Between 7am to 6pm - Pager - [phone]  After 6pm go to [email or web address] - [phone]  Hospitalists  Office  [phone]  CC: Primary care physician; No PCP Per Patient  Note: This dictation was prepared with Dragon dictation along with smaller phrase technology. Any transcriptional errors that result from this process are unintentional.

## 2015-12-10 NOTE — Consult Note (Signed)
[facility] Cardiology  CARDIOLOGY CONSULT NOTE  Patient ID: [name] [name] MRN: [id number] DOB/AGE: 08-07-1929 82 y.o.  Admit date: 12/09/2015 Referring Physician Sherryll Burger Primary Physician Varadarajan Primary Cardiologist Gwen Pounds Reason for Consultation Junctional tachycardia  HPI: 82 year old female referred for evaluation of tachycardia. The patient has known Alzheimer's dementia. She was sent to the emergency room apparently because of complaints of chest pain. Upon questioning, the patient denies chest pain, shortness of breath, palpitations or heart racing. In the emergency room patient was noted to be tachycardic. CT scan the chest was negative for pulmonary embolus. Patient has a history of paroxysmal atrial fibrillation, on Eliquis for stroke prevention. Review of ECG reveals junctional tachycardia with retrograde P waves at a rate of 120 BPM.  Review of systems complete and found to be negative unless listed above     Past Medical History:  Diagnosis Date  . A-fib (HCC)   . Allergic rhinitis   . Alzheimer disease   . CAD (coronary artery disease)   . Cardiomyopathy (HCC)   . CHF (congestive heart failure) (HCC)   . COPD (chronic obstructive pulmonary disease) (HCC)   . Dementia   . Diabetes mellitus without complication (HCC)   . Diverticulosis   . Eczema   . Hyperlipemia   . Hypertension   . Urinary incontinence     Past Surgical History:  Procedure Laterality Date  . HYSTEROTOMY    . none    . PACEMAKER PLACEMENT      Prescriptions Prior to Admission  Medication Sig Dispense Refill Last Dose  . acetaminophen (TYLENOL) 500 MG tablet Take 500 mg by mouth 2 (two) times daily.   12/09/2015 at 0800  . acetaminophen (TYLENOL) 500 MG tablet Take 500 mg by mouth daily as needed.   prn at prn  . albuterol (PROVENTIL HFA;VENTOLIN HFA) 108 (90 Base) MCG/ACT inhaler Inhale 2 puffs into the lungs every 6 (six) hours as needed for wheezing or shortness of breath.   prn at prn  . apixaban  (ELIQUIS) 2.5 MG TABS tablet Take 2.5 mg by mouth 2 (two) times daily.   12/08/2015 at 2000  . atorvastatin (LIPITOR) 40 MG tablet Take 40 mg by mouth daily.   12/08/2015 at 2000  . carbamide peroxide (DEBROX) 6.5 % otic solution Place 5 drops into both ears 2 (two) times daily as needed.   prn at prn  . carvedilol (COREG) 3.125 MG tablet Take 3.125 mg by mouth 2 (two) times daily with a meal.   12/09/2015 at 0800  . Dextromethorphan-Guaifenesin (Q-TUSSIN DM) 10-100 MG/5ML liquid Take 10 mLs by mouth every 6 (six) hours as needed.   prn at prn  . docusate sodium (COLACE) 100 MG capsule Take 100 mg by mouth 2 (two) times daily.   12/09/2015 at 0800  . fluticasone (FLONASE) 50 MCG/ACT nasal spray Place 1 spray into both nostrils daily.   12/09/2015 at 0800  . furosemide (LASIX) 40 MG tablet Take 40 mg by mouth daily.   12/09/2015 at 0800  . glipiZIDE (GLUCOTROL) 5 MG tablet Take by mouth daily before breakfast.   12/09/2015 at 0800  . montelukast (SINGULAIR) 10 MG tablet Take 10 mg by mouth daily.   12/09/2015 at 0800  . omeprazole (PRILOSEC) 20 MG capsule Take 20 mg by mouth daily.   12/09/2015 at 0700  . potassium chloride SA (K-DUR,KLOR-CON) 20 MEQ tablet Take 20 mEq by mouth 2 (two) times daily.   12/09/2015 at 0800  . traMADol (ULTRAM) 50 MG  tablet Take 50 mg by mouth 3 (three) times daily.   12/09/2015 at 0800   Social History   Social History  . Marital status: Widowed    Spouse name: N/A  . Number of children: N/A  . Years of education: N/A   Occupational History  . Not on file.   Social History Main Topics  . Smoking status: Former Games developer  . Smokeless tobacco: Former Neurosurgeon  . Alcohol use No  . Drug use: No  . Sexual activity: Not on file   Other Topics Concern  . Not on file   Social History Narrative  . No narrative on file    Family History  Problem Relation Age of Onset  . Hypertension Father       Review of systems complete and found to be negative unless listed above       PHYSICAL EXAM  General: Well developed, well nourished, in no acute distress HEENT:  Normocephalic and atramatic Neck:  No JVD.  Lungs: Clear bilaterally to auscultation and percussion. Heart: HRRR . Normal S1 and S2 without gallops or murmurs.  Abdomen: Bowel sounds are positive, abdomen soft and non-tender  Msk:  Back normal, normal gait. Normal strength and tone for age. Extremities: No clubbing, cyanosis or edema.   Neuro: Alert and oriented X 3. Psych:  Good affect, responds appropriately  Labs:   Lab Results  Component Value Date   WBC 4.8 12/09/2015   HGB 13.6 12/09/2015   HCT 38.8 12/09/2015   MCV 100.9 (H) 12/09/2015   PLT 177 12/09/2015    Recent Labs Lab 12/09/15 0938  NA 137  K 4.5  CL 103  CO2 27  BUN 8  CREATININE 0.53  CALCIUM 8.8*  PROT 6.6  BILITOT 1.4*  ALKPHOS 104  ALT 16  AST 30  GLUCOSE 195*   Lab Results  Component Value Date   TROPONINI <0.03 12/09/2015   No results found for: CHOL No results found for: HDL No results found for: LDLCALC No results found for: TRIG No results found for: CHOLHDL No results found for: LDLDIRECT    Radiology: Ct Angio Chest Pe W And/or Wo Contrast  Result Date: 12/09/2015 CLINICAL DATA:  Tachycardia. EXAM: CT ANGIOGRAPHY CHEST WITH CONTRAST TECHNIQUE: Multidetector CT imaging of the chest was performed using the standard protocol during bolus administration of intravenous contrast. Multiplanar CT image reconstructions and MIPs were obtained to evaluate the vascular anatomy. CONTRAST:  75 cc Isovue 370 IV COMPARISON:  10/31/2008 FINDINGS: Cardiovascular: No filling defects in the pulmonary arteries to suggest pulmonary emboli. Mild cardiomegaly. Pacer in place with single lead in the right ventricle. Scattered aortic calcifications. No aneurysm. Mediastinum/Nodes: Moderate sized hiatal hernia. No mediastinal, hilar, or axillary adenopathy. Lungs/Pleura: Biapical scarring. No confluent airspace opacities.  No pleural effusions. Upper Abdomen: Imaging into the upper abdomen shows no acute findings. Small hypodensities scattered through the liver, likely cysts. Musculoskeletal: Chest wall soft tissues are unremarkable. No acute bony abnormality. Degenerative changes in the thoracic spine. Review of the MIP images confirms the above findings. IMPRESSION: No evidence of pulmonary embolus. Mild cardiomegaly. Moderate-sized hiatal hernia. Biapical scarring. Aortic atherosclerosis. Electronically Signed   By: Charlett Nose M.D.   On: 12/09/2015 11:36  US Venous Img Lower Unilateral Right  Result Date: 12/09/2015 CLINICAL DATA:  Right lower extremity pain and swelling. Evaluate for pulmonary embolism. EXAM: RIGHT LOWER EXTREMITY VENOUS DOPPLER ULTRASOUND TECHNIQUE: Gray-scale sonography with graded compression, as well as color Doppler and duplex ultrasound  were performed to evaluate the lower extremity deep venous systems from the level of the common femoral vein and including the common femoral, femoral, profunda femoral, popliteal and calf veins including the posterior tibial, peroneal and gastrocnemius veins when visible. The superficial great saphenous vein was also interrogated. Spectral Doppler was utilized to evaluate flow at rest and with distal augmentation maneuvers in the common femoral, femoral and popliteal veins. COMPARISON:  None. FINDINGS: Contralateral Common Femoral Vein: Respiratory phasicity is normal and symmetric with the symptomatic side. No evidence of thrombus. Normal compressibility. Note is made of mild pulsatility involving the left common femoral vein. Common Femoral Vein: No evidence of thrombus. Normal compressibility, respiratory phasicity and response to augmentation. Note is made of mild pulsatility involving the right common femoral vein. Saphenofemoral Junction: No evidence of thrombus. Normal compressibility and flow on color Doppler imaging. Profunda Femoral Vein: No evidence of  thrombus. Normal compressibility and flow on color Doppler imaging. Femoral Vein: No evidence of thrombus. Normal compressibility, respiratory phasicity and response to augmentation. Popliteal Vein: No evidence of thrombus. Normal compressibility, respiratory phasicity and response to augmentation. Calf Veins: No evidence of thrombus. Normal compressibility and flow on color Doppler imaging. Superficial Great Saphenous Vein: No evidence of thrombus. Normal compressibility and flow on color Doppler imaging. Note is made of mild pulsatility involving the right superficial femoral vein. Venous Reflux:  None. Other Findings:  None. IMPRESSION: 1. No evidence of DVT within the right lower extremity. 2. Mild pulsatility involving the bilateral common femoral veins, nonspecific though could be seen in the setting of right-sided heart failure. Clinical correlation is advised. Electronically Signed   By: Simonne Come M.D.   On: 12/09/2015 10:58   EKG: Junctional tachycardia  ASSESSMENT AND PLAN:   1. Junctional tachycardia, relatively asymptomatic, negative troponin, in patient with known history of paroxysmal atrial fibrillation, on Ella request for stroke prevention 2. Alzheimer's dementia  Recommendations  1. Agree with overall current therapy 2. Continue Eliquis for stroke prevention 3. Uptitrate carvedilol 4. Uptitrate diltiazem   Signed: Sunday Klos MD,PhD, [facility] 12/10/2015, 12:12 PM

## 2015-12-11 DIAGNOSIS — I471 Supraventricular tachycardia: Secondary | ICD-10-CM | POA: Diagnosis not present

## 2015-12-11 LAB — GLUCOSE, CAPILLARY
GLUCOSE-CAPILLARY: 126 mg/dL — AB (ref 65–99)
GLUCOSE-CAPILLARY: 126 mg/dL — AB (ref 65–99)

## 2015-12-11 MED ORDER — APIXABAN 5 MG PO TABS: 5.0000 mg | ORAL_TABLET | Freq: Two times a day (BID) | ORAL | 0 refills | Status: DC

## 2015-12-11 MED ORDER — DIGOXIN 0.25 MG/ML IJ SOLN
0.2500 mg | Freq: Every day | INTRAMUSCULAR | Status: DC
  Administered 2015-12-11: 0.25 mg via INTRAVENOUS
  Filled 2015-12-11: qty 1

## 2015-12-11 MED ORDER — DILTIAZEM HCL 60 MG PO TABS: 60.0000 mg | ORAL_TABLET | Freq: Two times a day (BID) | ORAL | 0 refills | Status: DC

## 2015-12-11 NOTE — Discharge Instructions (Signed)
Nonspecific Tachycardia Tachycardia is a faster than normal heartbeat (more than 100 beats per minute). In adults, the heart normally beats between 60 and 100 times a minute. A fast heartbeat may be a normal response to exercise or stress. It does not necessarily mean that something is wrong. However, sometimes when your heart beats too fast it may not be able to pump enough blood to the rest of your body. This can result in chest pain, shortness of breath, dizziness, and even fainting. Nonspecific tachycardia means that the specific cause or pattern of your tachycardia is unknown. CAUSES  Tachycardia may be harmless or it may be due to a more serious underlying cause. Possible causes of tachycardia include:  Exercise or exertion.  Fever.  Pain or injury.  Infection.  Loss of body fluids (dehydration).  Overactive thyroid.  Lack of red blood cells (anemia).  Anxiety and stress.  Alcohol.  Caffeine.  Tobacco products.  Diet pills.  Illegal drugs.  Heart disease. SYMPTOMS  Rapid or irregular heartbeat (palpitations).  Suddenly feeling your heart beating (cardiac awareness).  Dizziness.  Tiredness (fatigue).  Shortness of breath.  Chest pain.  Nausea.  Fainting. DIAGNOSIS  Your caregiver will perform a physical exam and take your medical history. In some cases, a heart specialist (cardiologist) may be consulted. Your caregiver may also order:  Blood tests.  Electrocardiography. This test records the electrical activity of your heart.  A heart monitoring test. TREATMENT  Treatment will depend on the likely cause of your tachycardia. The goal is to treat the underlying cause of your tachycardia. Treatment methods may include:  Replacement of fluids or blood through an intravenous (IV) tube for moderate to severe dehydration or anemia.  New medicines or changes in your current medicines.  Diet and lifestyle changes.  Treatment for certain  infections.  Stress relief or relaxation methods. HOME CARE INSTRUCTIONS   Rest.  Drink enough fluids to keep your urine clear or pale yellow.  Do not smoke.  Avoid:  Caffeine.  Tobacco.  Alcohol.  Chocolate.  Stimulants such as over-the-counter diet pills or pills that help you stay awake.  Situations that cause anxiety or stress.  Illegal drugs such as marijuana, phencyclidine (PCP), and cocaine.  Only take medicine as directed by your caregiver.  Keep all follow-up appointments as directed by your caregiver. SEEK IMMEDIATE MEDICAL CARE IF:   You have pain in your chest, upper arms, jaw, or neck.  You become weak, dizzy, or feel faint.  You have palpitations that will not go away.  You vomit, have diarrhea, or pass blood in your stool.  Your skin is cool, pale, and wet.  You have a fever that will not go away with rest, fluids, and medicine. MAKE SURE YOU:   Understand these instructions.  Will watch your condition.  Will get help right away if you are not doing well or get worse.   This information is not intended to replace advice given to you by your health care provider. Make sure you discuss any questions you have with your health care provider.   Document Released: 06/04/2004 Document Revised: 07/20/2011 Document Reviewed: 11/09/2014 Elsevier Interactive Patient Education 2016 Elsevier Inc.  

## 2015-12-11 NOTE — Clinical Social Work Note (Signed)
Patient to be d/c'ed today to [facility].  Patient and family agreeable to plans will transport via ems patient's guardian and family home coordinator made aware.  Windell Moulding, MSW [phone]

## 2015-12-11 NOTE — Care Management (Signed)
[facility] notified of discharge and resumption of care needs.

## 2015-12-11 NOTE — Discharge Summary (Signed)
[facility] [employer] -  at [facility]   PATIENT NAME: [name] [name]    MR#:  [id number]  DATE OF BIRTH:  [birth year]  DATE OF ADMISSION:  12/09/2015   ADMITTING PHYSICIAN: Auburn Bilberry, MD  DATE OF DISCHARGE: 12/11/2015  PRIMARY CARE PHYSICIAN: No PCP Per Patient   ADMISSION DIAGNOSIS:  Tachycardia [R00.0] Swelling of lower extremity [M79.89] DISCHARGE DIAGNOSIS:  Active Problems:   Sinus tachycardia (HCC)  SECONDARY DIAGNOSIS:   Past Medical History:  Diagnosis Date  . A-fib (HCC)   . Allergic rhinitis   . Alzheimer disease   . CAD (coronary artery disease)   . Cardiomyopathy (HCC)   . CHF (congestive heart failure) (HCC)   . COPD (chronic obstructive pulmonary disease) (HCC)   . Dementia   . Diabetes mellitus without complication (HCC)   . Diverticulosis   . Eczema   . Hyperlipemia   . Hypertension   . Urinary incontinence    HOSPITAL COURSE:  Patient is a 82 year old African-American female with history of paroxysmal atrial fibrillation initially admitted with chest pain now noted to have sinus tachycardia intermittently  1. Sinus tachycardia - could be due to knee pain resolved  DISCHARGE CONDITIONS:  stable CONSULTS OBTAINED:  Treatment Team:  Marcina Millard, MD DRUG ALLERGIES:  No Known Allergies DISCHARGE MEDICATIONS:     Medication List    STOP taking these medications   traMADol 50 MG tablet Commonly known as:  ULTRAM     TAKE these medications   acetaminophen 500 MG tablet Commonly known as:  TYLENOL Take 500 mg by mouth 2 (two) times daily.   acetaminophen 500 MG tablet Commonly known as:  TYLENOL Take 500 mg by mouth daily as needed.   albuterol 108 (90 Base) MCG/ACT inhaler Commonly known as:  PROVENTIL HFA;VENTOLIN HFA Inhale 2 puffs into the lungs every 6 (six) hours as needed for wheezing or shortness of breath.   apixaban 5 MG Tabs tablet Commonly known as:  ELIQUIS Take 1 tablet (5 mg total) by mouth 2  (two) times daily. What changed:  medication strength  how much to take   atorvastatin 40 MG tablet Commonly known as:  LIPITOR Take 40 mg by mouth daily.   carbamide peroxide 6.5 % otic solution Commonly known as:  DEBROX Place 5 drops into both ears 2 (two) times daily as needed.   carvedilol 3.125 MG tablet Commonly known as:  COREG Take 3.125 mg by mouth 2 (two) times daily with a meal.   diltiazem 60 MG tablet Commonly known as:  CARDIZEM Take 1 tablet (60 mg total) by mouth 2 (two) times daily.   docusate sodium 100 MG capsule Commonly known as:  COLACE Take 100 mg by mouth 2 (two) times daily.   fluticasone 50 MCG/ACT nasal spray Commonly known as:  FLONASE Place 1 spray into both nostrils daily.   furosemide 40 MG tablet Commonly known as:  LASIX Take 40 mg by mouth daily.   glipiZIDE 5 MG tablet Commonly known as:  GLUCOTROL Take by mouth daily before breakfast.   montelukast 10 MG tablet Commonly known as:  SINGULAIR Take 10 mg by mouth daily.   omeprazole 20 MG capsule Commonly known as:  PRILOSEC Take 20 mg by mouth daily.   potassium chloride SA 20 MEQ tablet Commonly known as:  K-DUR,KLOR-CON Take 20 mEq by mouth 2 (two) times daily.   Q-TUSSIN DM 10-100 MG/5ML liquid Generic drug:  Dextromethorphan-Guaifenesin Take 10 mLs by mouth every 6 (  six) hours as needed.      DISCHARGE INSTRUCTIONS:   DIET:  Cardiac diet DISCHARGE CONDITION:  Stable ACTIVITY:  Activity as tolerated OXYGEN:  Home Oxygen: No.  Oxygen Delivery: room air DISCHARGE LOCATION:  group home   If you experience worsening of your admission symptoms, develop shortness of breath, life threatening emergency, suicidal or homicidal thoughts you must seek medical attention immediately by calling 911 or calling your MD immediately  if symptoms less severe.  You Must read complete instructions/literature along with all the possible adverse reactions/side effects for all the  Medicines you take and that have been prescribed to you. Take any new Medicines after you have completely understood and accpet all the possible adverse reactions/side effects.   Please note  You were cared for by a hospitalist during your hospital stay. If you have any questions about your discharge medications or the care you received while you were in the hospital after you are discharged, you can call the unit and asked to speak with the hospitalist on call if the hospitalist that took care of you is not available. Once you are discharged, your primary care physician will handle any further medical issues. Please note that NO REFILLS for any discharge medications will be authorized once you are discharged, as it is imperative that you return to your primary care physician (or establish a relationship with a primary care physician if you do not have one) for your aftercare needs so that they can reassess your need for medications and monitor your lab values.    On the day of Discharge:  VITAL SIGNS:  Blood pressure (!) 94/52, pulse 61, temperature 98.4 F (36.9 C), temperature source Oral, resp. rate 20, height 5\' 2"  (1.575 m), weight 63.5 kg (140 lb), SpO2 94 %. PHYSICAL EXAMINATION:  GENERAL:  82 y.o.-year-old patient lying in the bed with no acute distress.  EYES: Pupils equal, round, reactive to light and accommodation. No scleral icterus. Extraocular muscles intact.  HEENT: Head atraumatic, normocephalic. Oropharynx and nasopharynx clear.  NECK:  Supple, no jugular venous distention. No thyroid enlargement, no tenderness.  LUNGS: Normal breath sounds bilaterally, no wheezing, rales,rhonchi or crepitation. No use of accessory muscles of respiration.  CARDIOVASCULAR: S1, S2 normal. No murmurs, rubs, or gallops.  ABDOMEN: Soft, non-tender, non-distended. Bowel sounds present. No organomegaly or mass.  EXTREMITIES: No pedal edema, cyanosis, or clubbing.  NEUROLOGIC: Cranial nerves II through  XII are intact. Muscle strength 5/5 in all extremities. Sensation intact. Gait not checked.  PSYCHIATRIC: The patient is alert and oriented x 3.  SKIN: No obvious rash, lesion, or ulcer.  DATA REVIEW:   CBC  Recent Labs Lab 12/09/15 0938  WBC 4.8  HGB 13.6  HCT 38.8  PLT 177    Chemistries   Recent Labs Lab 12/09/15 0938  NA 137  K 4.5  CL 103  CO2 27  GLUCOSE 195*  BUN 8  CREATININE 0.53  CALCIUM 8.8*  AST 30  ALT 16  ALKPHOS 104  BILITOT 1.4*    Follow-up Information    12/11/15, MD. Go on 12/18/2015.   Specialty:  Internal Medicine Why:  Hospital Follow-up: appointment at 2:15pm Contact information: [address] [place] [phone]           Management plans discussed with the patient, family and they are in agreement.  CODE STATUS: DO NOT RESUSCITATE, consider palliative care evaluation, While at the facility and hospice  TOTAL TIME TAKING CARE  OF THIS PATIENT: 45 minutes.    [facility], Merridy Pascoe M.D on 12/11/2015 at 11:50 AM  Between 7am to 6pm - Pager - [phone]  After 6pm go to [email or web address] - Scientist, research (life sciences) [facility]  Office  [phone]  CC: Primary care physician; No PCP Per Patient   Note: This dictation was prepared with Dragon dictation along with smaller phrase technology. Any transcriptional errors that result from this process are unintentional.

## 2015-12-11 NOTE — Progress Notes (Signed)
Bedside report received, patient resting in bed no distress noted

## 2015-12-11 NOTE — Progress Notes (Signed)
Patient being discharge to group home, EMS called for  transport, IV removed , tele rermoved,

## 2015-12-14 ENCOUNTER — Encounter: Payer: Self-pay | Admitting: Emergency Medicine

## 2015-12-14 ENCOUNTER — Emergency Department
Admission: EM | Admit: 2015-12-14 | Discharge: 2015-12-14 | Disposition: A | Discharge status: Home / Self Care | Payer: Medicare Other | Attending: Emergency Medicine | Admitting: Emergency Medicine

## 2015-12-14 DIAGNOSIS — I110 Hypertensive heart disease with heart failure: Secondary | ICD-10-CM | POA: Insufficient documentation

## 2015-12-14 DIAGNOSIS — E119 Type 2 diabetes mellitus without complications: Secondary | ICD-10-CM | POA: Diagnosis not present

## 2015-12-14 DIAGNOSIS — I471 Supraventricular tachycardia: Secondary | ICD-10-CM | POA: Diagnosis not present

## 2015-12-14 DIAGNOSIS — R000 Tachycardia, unspecified: Secondary | ICD-10-CM | POA: Diagnosis present

## 2015-12-14 DIAGNOSIS — I2510 Atherosclerotic heart disease of native coronary artery without angina pectoris: Secondary | ICD-10-CM | POA: Insufficient documentation

## 2015-12-14 DIAGNOSIS — Z79899 Other long term (current) drug therapy: Secondary | ICD-10-CM | POA: Diagnosis not present

## 2015-12-14 DIAGNOSIS — N390 Urinary tract infection, site not specified: Secondary | ICD-10-CM | POA: Insufficient documentation

## 2015-12-14 DIAGNOSIS — I509 Heart failure, unspecified: Secondary | ICD-10-CM | POA: Insufficient documentation

## 2015-12-14 DIAGNOSIS — G309 Alzheimer's disease, unspecified: Secondary | ICD-10-CM | POA: Insufficient documentation

## 2015-12-14 DIAGNOSIS — J449 Chronic obstructive pulmonary disease, unspecified: Secondary | ICD-10-CM | POA: Diagnosis not present

## 2015-12-14 DIAGNOSIS — I4891 Unspecified atrial fibrillation: Secondary | ICD-10-CM | POA: Insufficient documentation

## 2015-12-14 LAB — CBC WITH DIFFERENTIAL/PLATELET
Basophils Absolute: 0 10*3/uL (ref 0–0.1)
Basophils Relative: 1 %
Eosinophils Absolute: 0 10*3/uL (ref 0–0.7)
Eosinophils Relative: 0 %
HEMATOCRIT: 42.5 % (ref 35.0–47.0)
HEMOGLOBIN: 14.9 g/dL (ref 12.0–16.0)
LYMPHS ABS: 0.9 10*3/uL — AB (ref 1.0–3.6)
Lymphocytes Relative: 20 %
MCH: 35 pg — AB (ref 26.0–34.0)
MCHC: 35 g/dL (ref 32.0–36.0)
MCV: 100.1 fL — ABNORMAL HIGH (ref 80.0–100.0)
MONOS PCT: 10 %
Monocytes Absolute: 0.4 10*3/uL (ref 0.2–0.9)
NEUTROS ABS: 3.1 10*3/uL (ref 1.4–6.5)
NEUTROS PCT: 69 %
Platelets: 186 10*3/uL (ref 150–440)
RBC: 4.25 MIL/uL (ref 3.80–5.20)
RDW: 12.7 % (ref 11.5–14.5)
WBC: 4.5 10*3/uL (ref 3.6–11.0)

## 2015-12-14 LAB — BASIC METABOLIC PANEL
Anion gap: 8 (ref 5–15)
BUN: 8 mg/dL (ref 6–20)
CHLORIDE: 103 mmol/L (ref 101–111)
CO2: 26 mmol/L (ref 22–32)
CREATININE: 0.61 mg/dL (ref 0.44–1.00)
Calcium: 9.3 mg/dL (ref 8.9–10.3)
GFR calc non Af Amer: >60 mL/min (ref >60)
Glucose, Bld: 162 mg/dL — ABNORMAL HIGH (ref 65–99)
POTASSIUM: 3.7 mmol/L (ref 3.5–5.1)
Sodium: 137 mmol/L (ref 135–145)

## 2015-12-14 LAB — TROPONIN I: Troponin I: <0.03 ng/mL (ref <0.03)

## 2015-12-14 LAB — URINALYSIS COMPLETE WITH MICROSCOPIC (ARMC ONLY)
Bilirubin Urine: NEGATIVE
Glucose, UA: NEGATIVE mg/dL
KETONES UR: NEGATIVE mg/dL
Nitrite: NEGATIVE
PH: 6 (ref 5.0–8.0)
PROTEIN: NEGATIVE mg/dL
SPECIFIC GRAVITY, URINE: 1.005 (ref 1.005–1.030)

## 2015-12-14 LAB — GLUCOSE, CAPILLARY: Glucose-Capillary: 158 mg/dL — ABNORMAL HIGH (ref 65–99)

## 2015-12-14 MED ORDER — DEXTROSE 5 % IV SOLN
1.0000 g | Freq: Once | INTRAVENOUS | Status: AC
  Administered 2015-12-14: 1 g via INTRAVENOUS
  Filled 2015-12-14: qty 10

## 2015-12-14 MED ORDER — CEPHALEXIN 500 MG PO CAPS: 500.0000 mg | ORAL_CAPSULE | Freq: Three times a day (TID) | ORAL | 0 refills | Status: DC

## 2015-12-14 NOTE — ED Provider Notes (Signed)
[facility] Emergency Department Provider Note    ____________________________________________   I have reviewed the triage vital signs and the nursing notes.   HISTORY  Chief Complaint Tachycardia   History limited by: Dementia   HPI [name] [name] is a 82 y.o. female with history of Alzheimer's dementia who presents to the emergency department today because of concerns for tachycardia. The patient was recently seen in the emergency department and admitted to the hospital because of tachycardia. Per cardiology note the patient was having intermittent runs of junctional tachycardia. Patient herself has a history of dementia so history is limited. Patient denies any chest pain or shortness of breath. Patient does complain primarily pain in her left knee. She denies any fevers.   Past Medical History:  Diagnosis Date  . A-fib (HCC)   . Allergic rhinitis   . Alzheimer disease   . CAD (coronary artery disease)   . Cardiomyopathy (HCC)   . CHF (congestive heart failure) (HCC)   . COPD (chronic obstructive pulmonary disease) (HCC)   . Dementia   . Diabetes mellitus without complication (HCC)   . Diverticulosis   . Eczema   . Hyperlipemia   . Hypertension   . Urinary incontinence     Patient Active Problem List   Diagnosis Date Noted  . Sinus tachycardia (HCC) 12/09/2015    Past Surgical History:  Procedure Laterality Date  . HYSTEROTOMY    . none    . PACEMAKER PLACEMENT      Prior to Admission medications   Medication Sig Start Date End Date Taking? Authorizing Provider  acetaminophen (TYLENOL) 500 MG tablet Take 500 mg by mouth 2 (two) times daily.    Historical Provider, MD  acetaminophen (TYLENOL) 500 MG tablet Take 500 mg by mouth daily as needed.    Historical Provider, MD  albuterol (PROVENTIL HFA;VENTOLIN HFA) 108 (90 Base) MCG/ACT inhaler Inhale 2 puffs into the lungs every 6 (six) hours as needed for wheezing or shortness of breath.     Historical Provider, MD  apixaban (ELIQUIS) 5 MG TABS tablet Take 1 tablet (5 mg total) by mouth 2 (two) times daily. 12/11/15   Delfino Lovett, MD  atorvastatin (LIPITOR) 40 MG tablet Take 40 mg by mouth daily.    Historical Provider, MD  carbamide peroxide (DEBROX) 6.5 % otic solution Place 5 drops into both ears 2 (two) times daily as needed.    Historical Provider, MD  carvedilol (COREG) 3.125 MG tablet Take 3.125 mg by mouth 2 (two) times daily with a meal.    Historical Provider, MD  Dextromethorphan-Guaifenesin (Q-TUSSIN DM) 10-100 MG/5ML liquid Take 10 mLs by mouth every 6 (six) hours as needed.    Historical Provider, MD  diltiazem (CARDIZEM) 60 MG tablet Take 1 tablet (60 mg total) by mouth 2 (two) times daily. 12/11/15   Delfino Lovett, MD  docusate sodium (COLACE) 100 MG capsule Take 100 mg by mouth 2 (two) times daily.    Historical Provider, MD  fluticasone (FLONASE) 50 MCG/ACT nasal spray Place 1 spray into both nostrils daily.    Historical Provider, MD  furosemide (LASIX) 40 MG tablet Take 40 mg by mouth daily.    Historical Provider, MD  glipiZIDE (GLUCOTROL) 5 MG tablet Take by mouth daily before breakfast.    Historical Provider, MD  montelukast (SINGULAIR) 10 MG tablet Take 10 mg by mouth daily.    Historical Provider, MD  omeprazole (PRILOSEC) 20 MG capsule Take 20 mg by mouth daily.  Historical Provider, MD  potassium chloride SA (K-DUR,KLOR-CON) 20 MEQ tablet Take 20 mEq by mouth 2 (two) times daily.    Historical Provider, MD    Allergies Review of patient's allergies indicates no known allergies.  Family History  Problem Relation Age of Onset  . Hypertension Father     Social History Social History  Substance Use Topics  . Smoking status: Former Games developer  . Smokeless tobacco: Former Neurosurgeon  . Alcohol use No    Review of Systems  Constitutional: Negative for fever. Cardiovascular: Negative for chest pain. Respiratory: Negative for shortness of  breath. Gastrointestinal: Negative for abdominal pain, vomiting and diarrhea. Neurological: Negative for headaches, focal weakness or numbness.  10-point ROS otherwise negative.  ____________________________________________   PHYSICAL EXAM:  VITAL SIGNS: ED Triage Vitals [12/14/15 0808]  Enc Vitals Group     BP 125/90     Pulse Rate (!) 126     Resp (!) 21     Temp 98 F (36.7 C)     Temp src      SpO2 99 %     Weight 150 lb (68 kg)     Height 5\' 4"  (1.626 m)     Head Circumference      Peak Flow      Pain Score      Pain Loc      Pain Edu?      Excl. in GC?      Constitutional: Alert and oriented.  Eyes: Conjunctivae are normal. PERRL. Normal extraocular movements. ENT   Head: Normocephalic and atraumatic.   Nose: No congestion/rhinnorhea.   Mouth/Throat: Mucous membranes are moist.   Neck: No stridor. Hematological/Lymphatic/Immunilogical: No cervical lymphadenopathy. Cardiovascular: Normal rate, regular rhythm.  No murmurs, rubs, or gallops. Respiratory: Normal respiratory effort without tachypnea nor retractions. Breath sounds are clear and equal bilaterally. No wheezes/rales/rhonchi. Gastrointestinal: Soft and nontender. No distention.  Genitourinary: Deferred Musculoskeletal: Normal range of motion in all extremities. No joint effusions.  No lower extremity tenderness nor edema. Neurologic:  Normal speech and language. No gross focal neurologic deficits are appreciated.  Skin:  Skin is warm, dry and intact. No rash noted. Psychiatric: Mood and affect are normal. Speech and behavior are normal. Patient exhibits appropriate insight and judgment.  ____________________________________________    LABS (pertinent positives/negatives)  Labs Reviewed  GLUCOSE, CAPILLARY - Abnormal; Notable for the following:       Result Value   Glucose-Capillary 158 (*)    All other components within normal limits  CBC WITH DIFFERENTIAL/PLATELET - Abnormal;  Notable for the following:    MCV 100.1 (*)    MCH 35.0 (*)    Lymphs Abs 0.9 (*)    All other components within normal limits  BASIC METABOLIC PANEL - Abnormal; Notable for the following:    Glucose, Bld 162 (*)    All other components within normal limits  URINALYSIS COMPLETEWITH MICROSCOPIC ([facility] ONLY) - Abnormal; Notable for the following:    Color, Urine YELLOW (*)    APPearance CLOUDY (*)    Hgb urine dipstick 1+ (*)    Leukocytes, UA 1+ (*)    Bacteria, UA MANY (*)    Squamous Epithelial / LPF 0-5 (*)    All other components within normal limits  URINE CULTURE  TROPONIN I     ____________________________________________   EKG  I, Phineas Semen, attending physician, personally viewed and interpreted this EKG  EKG Time: 0804 Rate: 118 Rhythm: junctional tachycardia Axis: normal Intervals: qtc  445 QRS: narrow ST changes: no st elevation Impression: abnormal ekg   I, Phineas Semen, attending physician, personally viewed and interpreted this EKG  EKG Time: 0808 Rate: 76 Rhythm: normal sinus rhythm with a couple v paced beats Axis: normal Intervals: qtc 383 QRS: narrow ST changes: no st elevation Impression: abnormal ekg   ____________________________________________    RADIOLOGY  None  ____________________________________________   PROCEDURES  Procedures  ____________________________________________   INITIAL IMPRESSION / ASSESSMENT AND PLAN / ED COURSE  Pertinent labs & imaging results that were available during my care of the patient were reviewed by me and considered in my medical decision making (see chart for details).  Patient presented to the emergency department today from nursing facility because of concerns for tachycardia. Patient did have a recent admission for the same and was found to have intermittent junctional tachycardia. Was seen by cardiology. Will check blood work and urine here.  Clinical Course    Urine was  concerning for UTI. I discussed with cardiology who thought no further intervention was required for the chest will tachycardia at this time. Patient received dose of IV antibiotics and will be discharged home with prescription for antibiotics. ____________________________________________   FINAL CLINICAL IMPRESSION(S) / ED DIAGNOSES  Final diagnoses:  UTI (lower urinary tract infection)  Junctional tachycardia (HCC)     Note: This dictation was prepared with Dragon dictation. Any transcriptional errors that result from this process are unintentional    Phineas Semen, MD 12/14/15 1349

## 2015-12-14 NOTE — ED Notes (Signed)
NAD noted at this time. Pt denies any needs. Pt remains calm and cooperative and pleasantly confused at this time. Will continue to monitor for further patient needs at this time.

## 2015-12-14 NOTE — ED Notes (Signed)
NAD noted at this time. Pt resting in bed. Denies any needs at this time. Will continue to monitor. Awaiting EMS arrival to return patient to [facility].

## 2015-12-14 NOTE — Discharge Instructions (Signed)
Please seek medical attention for any high fevers, chest pain, shortness of breath, change in behavior, persistent vomiting, bloody stool or any other new or concerning symptoms.  

## 2015-12-14 NOTE — ED Notes (Signed)
Pt resting in bed with eyes closed. TV turned on to [employer], pt remains with Posey Alarm and fall pads around the bed. Respirations even and unlabored.

## 2015-12-14 NOTE — ED Notes (Addendum)
Spoke with Crystal, [facility] [employer], who stated patient was sent to hospital due to her HR being in the 140's. States pt was D/C earlier this week after being sent for same thing.    [employer], [facility] [employer] [phone]

## 2015-12-14 NOTE — ED Notes (Signed)
Pt placed in yellow socks, high fall risk bracelet applied, posey alarm placed to patient R shoulder, and fall pads placed on either side of bed.

## 2015-12-14 NOTE — ED Notes (Signed)
Per [place], EDT pt continues to rest in bed.

## 2015-12-14 NOTE — ED Notes (Signed)
Per [employer], Med Tech at [facility] pt needs to return via EMS. Will notify secretary for patient transport.

## 2015-12-14 NOTE — ED Notes (Signed)
NAD noted at this time. This RN to bedside to start Rocephin abx. Pt states that she is hungry. Pt remains calm, cooperative, and pleasantly confused. Will continue to monitor.

## 2015-12-14 NOTE — ED Notes (Signed)
NAD noted at thist ime. Pt continues to rest in bed. Respirations even and unlabored. Eyes noted to be closed. TV and lights turned low. Will continue to monitor.

## 2015-12-14 NOTE — ED Notes (Signed)
Report called to [employer], Med Tech at this time. D/C instructions reviewed with Crystal, states understanding, denies any questions. Crystal is to call back regarding pt transport back to [facility], unsure if they are able to provide transportation at this time.

## 2015-12-14 NOTE — ED Notes (Signed)
Patient changed and clean brief reapplied at this time. Pt repositioned at this time as well. Pt remains calm and cooperative and pleasantly confused.

## 2015-12-14 NOTE — ED Triage Notes (Signed)
Pt presents to ED via [facility] from [facility] facility. Per EMS pt was seen here on Monday and admitted to the hospital for 2 days for tachycardia and problems with her pacemaker. Unknown to EMS if pacemaker or ICD. Pt presents with c/o pain in hands knees. Pt denies CP or SHOB at this time. Pt has hx of Alzheimers. Pt is A&O to self, but is pleasantly confused with staff.

## 2015-12-18 LAB — URINE CULTURE: Culture: >=100000 — AB

## 2015-12-19 ENCOUNTER — Telehealth: Payer: Self-pay | Admitting: Pharmacist

## 2015-12-19 NOTE — Telephone Encounter (Signed)
Spoke to [place] at [facility] [phone] about urine cx results. States that provider just started her on macrobid today which will cover the eneterococcus.  Olene Floss, Pharm.D Clinical Pharmacist

## 2016-05-15 ENCOUNTER — Emergency Department: Payer: Medicare Other

## 2016-05-15 ENCOUNTER — Emergency Department
Admission: EM | Admit: 2016-05-15 | Discharge: 2016-05-15 | Disposition: A | Discharge status: Home / Self Care | Payer: Medicare Other | Attending: Emergency Medicine | Admitting: Emergency Medicine

## 2016-05-15 ENCOUNTER — Encounter: Payer: Self-pay | Admitting: Emergency Medicine

## 2016-05-15 DIAGNOSIS — I509 Heart failure, unspecified: Secondary | ICD-10-CM | POA: Diagnosis not present

## 2016-05-15 DIAGNOSIS — Z87891 Personal history of nicotine dependence: Secondary | ICD-10-CM | POA: Insufficient documentation

## 2016-05-15 DIAGNOSIS — E119 Type 2 diabetes mellitus without complications: Secondary | ICD-10-CM | POA: Diagnosis not present

## 2016-05-15 DIAGNOSIS — R079 Chest pain, unspecified: Secondary | ICD-10-CM | POA: Diagnosis not present

## 2016-05-15 DIAGNOSIS — G309 Alzheimer's disease, unspecified: Secondary | ICD-10-CM | POA: Insufficient documentation

## 2016-05-15 DIAGNOSIS — Z79899 Other long term (current) drug therapy: Secondary | ICD-10-CM | POA: Insufficient documentation

## 2016-05-15 DIAGNOSIS — J449 Chronic obstructive pulmonary disease, unspecified: Secondary | ICD-10-CM | POA: Diagnosis not present

## 2016-05-15 DIAGNOSIS — I110 Hypertensive heart disease with heart failure: Secondary | ICD-10-CM | POA: Diagnosis not present

## 2016-05-15 DIAGNOSIS — I2510 Atherosclerotic heart disease of native coronary artery without angina pectoris: Secondary | ICD-10-CM | POA: Insufficient documentation

## 2016-05-15 DIAGNOSIS — Z7984 Long term (current) use of oral hypoglycemic drugs: Secondary | ICD-10-CM | POA: Diagnosis not present

## 2016-05-15 LAB — COMPREHENSIVE METABOLIC PANEL
ALK PHOS: 71 U/L (ref 38–126)
ALT: 12 U/L — AB (ref 14–54)
ANION GAP: 10 (ref 5–15)
AST: 34 U/L (ref 15–41)
Albumin: 4.4 g/dL (ref 3.5–5.0)
BILIRUBIN TOTAL: 0.9 mg/dL (ref 0.3–1.2)
BUN: 8 mg/dL (ref 6–20)
CALCIUM: 9.2 mg/dL (ref 8.9–10.3)
CO2: 27 mmol/L (ref 22–32)
CREATININE: 0.62 mg/dL (ref 0.44–1.00)
Chloride: 99 mmol/L — ABNORMAL LOW (ref 101–111)
Glucose, Bld: 87 mg/dL (ref 65–99)
Potassium: 3.6 mmol/L (ref 3.5–5.1)
SODIUM: 136 mmol/L (ref 135–145)
TOTAL PROTEIN: 7.6 g/dL (ref 6.5–8.1)

## 2016-05-15 LAB — CBC WITH DIFFERENTIAL/PLATELET
Basophils Absolute: 0 10*3/uL (ref 0–0.1)
Basophils Relative: 1 %
EOS ABS: 0 10*3/uL (ref 0–0.7)
Eosinophils Relative: 0 %
HCT: 41.7 % (ref 35.0–47.0)
HEMOGLOBIN: 13.8 g/dL (ref 12.0–16.0)
LYMPHS ABS: 1.3 10*3/uL (ref 1.0–3.6)
Lymphocytes Relative: 17 %
MCH: 34 pg (ref 26.0–34.0)
MCHC: 33.1 g/dL (ref 32.0–36.0)
MCV: 102.7 fL — AB (ref 80.0–100.0)
MONOS PCT: 9 %
Monocytes Absolute: 0.7 10*3/uL (ref 0.2–0.9)
NEUTROS PCT: 73 %
Neutro Abs: 5.5 10*3/uL (ref 1.4–6.5)
Platelets: 243 10*3/uL (ref 150–440)
RBC: 4.06 MIL/uL (ref 3.80–5.20)
RDW: 12.6 % (ref 11.5–14.5)
WBC: 7.5 10*3/uL (ref 3.6–11.0)

## 2016-05-15 LAB — TROPONIN I

## 2016-05-15 NOTE — ED Notes (Signed)
This RN called [name] [name] and spoke to [place] about pts discharge. Queen verbalized understanding. EMS called and waiting for transport at this time. If further contact is needed Leanne Chang can be reached at [phone].

## 2016-05-15 NOTE — ED Triage Notes (Signed)
[facility].  Patient has dementia and does not complain of anything.  She answers and is at times appropriate, butmostlyjust looks down at her nails and around theroom.

## 2016-05-15 NOTE — ED Notes (Signed)
EMS arrived to take pt home. Pt in NAD at time of d/c.

## 2016-05-15 NOTE — ED Provider Notes (Signed)
[facility] Emergency Department Provider Note   Level V caveat: Review of systems and history is limited by dementia     Time seen: ----------------------------------------- 1:22 PM on 05/15/2016 -----------------------------------------    I have reviewed the triage vital signs and the nursing notes.   HISTORY  Chief Complaint Chest Pain    HPI [name] [name] is a 83 y.o. female who presents to the ER for generalized complaints. She had complained of chest pain earlier but denies any such complaints now. She has dementia and is somewhat of a difficult historian.   Past Medical History:  Diagnosis Date  . A-fib (HCC)   . Allergic rhinitis   . Alzheimer disease   . CAD (coronary artery disease)   . Cardiomyopathy (HCC)   . CHF (congestive heart failure) (HCC)   . COPD (chronic obstructive pulmonary disease) (HCC)   . Dementia   . Diabetes mellitus without complication (HCC)   . Diverticulosis   . Eczema   . Hyperlipemia   . Hypertension   . Urinary incontinence     Patient Active Problem List   Diagnosis Date Noted  . Sinus tachycardia 12/09/2015    Past Surgical History:  Procedure Laterality Date  . HYSTEROTOMY    . none    . PACEMAKER PLACEMENT      Allergies Patient has no known allergies.  Social History Social History  Substance Use Topics  . Smoking status: Former Games developer  . Smokeless tobacco: Former Neurosurgeon  . Alcohol use No    Review of Systems Unknown, positive for reported chest pain  ____________________________________________   PHYSICAL EXAM:  VITAL SIGNS: ED Triage Vitals  Enc Vitals Group     BP 05/15/16 1320 101/80     Pulse Rate 05/15/16 1320 (!) 125     Resp 05/15/16 1320 (!) 22     Temp 05/15/16 1320 98.1 F (36.7 C)     Temp Source 05/15/16 1320 Oral     SpO2 05/15/16 1320 100 %     Weight 05/15/16 1321 140 lb (63.5 kg)     Height 05/15/16 1321 5\' 5"  (1.651 m)     Head Circumference --    Peak Flow --      Pain Score --      Pain Loc --      Pain Edu? --      Excl. in GC? --     Constitutional: Alert But disoriented Well appearing and in no distress. Eyes: Conjunctivae are normal. PERRL. Normal extraocular movements. ENT   Head: Normocephalic and atraumatic.   Nose: No congestion/rhinnorhea.   Mouth/Throat: Mucous membranes are moist.   Neck: No stridor. Cardiovascular: Rapid rate, regular rhythm. No murmurs, rubs, or gallops. Respiratory: Normal respiratory effort without tachypnea nor retractions. Breath sounds are clear and equal bilaterally. No wheezes/rales/rhonchi. Gastrointestinal: Soft and nontender. Normal bowel sounds Musculoskeletal: Nontender with normal range of motion in all extremities. No lower extremity tenderness nor edema. Neurologic:  Normal speech and language. No gross focal neurologic deficits are appreciated.  Skin:  Skin is warm, dry and intact. No rash noted. Psychiatric: Mood and affect are normal. Speech and behavior are normal.  ____________________________________________  EKG: Interpreted by me. Sinus tachycardia with a rate of 122 bpm, EDC's, prolonged PR interval nonspecific conduction delay  ____________________________________________  ED COURSE:  Pertinent labs & imaging results that were available during my care of the patient were reviewed by me and considered in my medical decision making (see  chart for details). Clinical Course   Patient presents to the ER for chest pain. We will assess with basic labs. Reevaluate  Procedures ____________________________________________   LABS (pertinent positives/negatives)  Labs Reviewed  CBC WITH DIFFERENTIAL/PLATELET - Abnormal; Notable for the following:       Result Value   MCV 102.7 (*)    All other components within normal limits  COMPREHENSIVE METABOLIC PANEL - Abnormal; Notable for the following:    Chloride 99 (*)    ALT 12 (*)    All other components within  normal limits  TROPONIN I  URINALYSIS, COMPLETE (UACMP) WITH MICROSCOPIC    RADIOLOGY  Chest x-ray Reveals no acute process ____________________________________________  FINAL ASSESSMENT AND PLAN  Dementia, chest pain  Plan: Patient with labs and imaging as dictated above. Patient presented to the ER with complaints that she no longer has or remembers. Her labs and workup here been negative. She is stable for outpatient follow-up.   Emily Filbert, MD   Note: This dictation was prepared with Dragon dictation. Any transcriptional errors that result from this process are unintentional    Emily Filbert, MD 05/15/16 1454

## 2016-05-15 NOTE — ED Notes (Signed)
Pt dressed and changed due to wet bed and soiled diaper. Pts IV removed and pt covered in a warm blanket. Lights dimmed and pt in NAD at this time.

## 2017-02-12 ENCOUNTER — Emergency Department
Admission: EM | Admit: 2017-02-12 | Discharge: 2017-02-12 | Disposition: A | Discharge status: Home / Self Care | Payer: Medicare Other | Attending: Emergency Medicine | Admitting: Emergency Medicine

## 2017-02-12 ENCOUNTER — Emergency Department: Payer: Medicare Other

## 2017-02-12 ENCOUNTER — Encounter: Payer: Self-pay | Admitting: Emergency Medicine

## 2017-02-12 DIAGNOSIS — F0280 Dementia in other diseases classified elsewhere without behavioral disturbance: Secondary | ICD-10-CM | POA: Diagnosis not present

## 2017-02-12 DIAGNOSIS — I509 Heart failure, unspecified: Secondary | ICD-10-CM | POA: Diagnosis not present

## 2017-02-12 DIAGNOSIS — Z87891 Personal history of nicotine dependence: Secondary | ICD-10-CM | POA: Insufficient documentation

## 2017-02-12 DIAGNOSIS — L089 Local infection of the skin and subcutaneous tissue, unspecified: Secondary | ICD-10-CM | POA: Diagnosis present

## 2017-02-12 DIAGNOSIS — L97521 Non-pressure chronic ulcer of other part of left foot limited to breakdown of skin: Secondary | ICD-10-CM | POA: Insufficient documentation

## 2017-02-12 DIAGNOSIS — E11622 Type 2 diabetes mellitus with other skin ulcer: Secondary | ICD-10-CM | POA: Insufficient documentation

## 2017-02-12 DIAGNOSIS — I110 Hypertensive heart disease with heart failure: Secondary | ICD-10-CM | POA: Insufficient documentation

## 2017-02-12 DIAGNOSIS — Z950 Presence of cardiac pacemaker: Secondary | ICD-10-CM | POA: Diagnosis not present

## 2017-02-12 DIAGNOSIS — G309 Alzheimer's disease, unspecified: Secondary | ICD-10-CM | POA: Diagnosis not present

## 2017-02-12 DIAGNOSIS — I739 Peripheral vascular disease, unspecified: Secondary | ICD-10-CM | POA: Insufficient documentation

## 2017-02-12 DIAGNOSIS — I2510 Atherosclerotic heart disease of native coronary artery without angina pectoris: Secondary | ICD-10-CM | POA: Diagnosis not present

## 2017-02-12 DIAGNOSIS — L98491 Non-pressure chronic ulcer of skin of other sites limited to breakdown of skin: Secondary | ICD-10-CM

## 2017-02-12 LAB — COMPREHENSIVE METABOLIC PANEL
ALT: 13 U/L — ABNORMAL LOW (ref 14–54)
AST: 23 U/L (ref 15–41)
Albumin: 2.6 g/dL — ABNORMAL LOW (ref 3.5–5.0)
Alkaline Phosphatase: 119 U/L (ref 38–126)
Anion gap: 12 (ref 5–15)
BUN: 11 mg/dL (ref 6–20)
CO2: 19 mmol/L — ABNORMAL LOW (ref 22–32)
Calcium: 8.4 mg/dL — ABNORMAL LOW (ref 8.9–10.3)
Chloride: 110 mmol/L (ref 101–111)
Creatinine, Ser: 0.66 mg/dL (ref 0.44–1.00)
GFR calc Af Amer: >60 mL/min (ref >60)
GFR calc non Af Amer: >60 mL/min (ref >60)
Glucose, Bld: 87 mg/dL (ref 65–99)
Potassium: 4 mmol/L (ref 3.5–5.1)
Sodium: 141 mmol/L (ref 135–145)
Total Bilirubin: 0.4 mg/dL (ref 0.3–1.2)
Total Protein: 6.3 g/dL — ABNORMAL LOW (ref 6.5–8.1)

## 2017-02-12 LAB — CBC WITH DIFFERENTIAL/PLATELET
Basophils Absolute: 0.1 10*3/uL (ref 0–0.1)
Basophils Relative: 1 %
EOS ABS: 0 10*3/uL (ref 0–0.7)
Eosinophils Relative: 0 %
HEMATOCRIT: 35.2 % (ref 35.0–47.0)
HEMOGLOBIN: 12.1 g/dL (ref 12.0–16.0)
LYMPHS ABS: 1.3 10*3/uL (ref 1.0–3.6)
Lymphocytes Relative: 19 %
MCH: 37.9 pg — AB (ref 26.0–34.0)
MCHC: 34.4 g/dL (ref 32.0–36.0)
MCV: 110.1 fL — ABNORMAL HIGH (ref 80.0–100.0)
Monocytes Absolute: 0.9 10*3/uL (ref 0.2–0.9)
Monocytes Relative: 13 %
NEUTROS ABS: 4.9 10*3/uL (ref 1.4–6.5)
NEUTROS PCT: 67 %
Platelets: 410 10*3/uL (ref 150–440)
RBC: 3.2 MIL/uL — AB (ref 3.80–5.20)
RDW: 14.9 % — ABNORMAL HIGH (ref 11.5–14.5)
WBC: 7.2 10*3/uL (ref 3.6–11.0)

## 2017-02-12 MED ORDER — MUPIROCIN CALCIUM 2 % EX CREA
TOPICAL_CREAM | Freq: Once | CUTANEOUS | Status: AC
  Administered 2017-02-12: 19:00:00 via TOPICAL
  Filled 2017-02-12: qty 15

## 2017-02-12 MED ORDER — CEPHALEXIN 250 MG PO CAPS: 250.0000 mg | ORAL_CAPSULE | Freq: Four times a day (QID) | ORAL | 0 refills | Status: DC

## 2017-02-12 MED ORDER — MUPIROCIN 2 % EX OINT: TOPICAL_OINTMENT | CUTANEOUS | 0 refills | Status: DC

## 2017-02-12 NOTE — ED Triage Notes (Signed)
Legal guardian is Child psychotherapist and on way per EMS (susan Allie Dimmer [phone])  Sent from [facility] for infected 2nd toe left foot. Wound present since 9/12. No wound MD per EMS from report. Wound open and toe is black in color. Foot warm. Pt has dementia

## 2017-02-12 NOTE — ED Notes (Signed)
Social worker remains at bedside. No needs currently. NAD. Pt has taken monitor off. Left off to decrease agitation

## 2017-02-12 NOTE — ED Notes (Signed)
Went over discharge with kay, Child psychotherapist and guardian rep.

## 2017-02-12 NOTE — ED Notes (Signed)
Darl Pikes, Child psychotherapist at bedside.  Joyce Gross is person for consent for procedures.

## 2017-02-12 NOTE — ED Notes (Signed)
Pt waiting on EMS transport

## 2017-02-12 NOTE — ED Notes (Signed)
Pharmacy called for bactroban

## 2017-02-12 NOTE — ED Notes (Signed)
Pt leaving with [facility] to [facility]. Stable on discharge.

## 2017-02-12 NOTE — ED Provider Notes (Signed)
[facility] Emergency Department Provider Note       Time seen: ----------------------------------------- 4:17 PM on 02/12/2017 -----------------------------------------  Level V caveat: History/ROS limited by dementia   I have reviewed the triage vital signs and the nursing notes.   HISTORY   Chief Complaint No chief complaint on file.    HPI [name] [name] is a 83 y.o. female who presents to the ED for and infected toe. Patient arrives via EMS from a nursing home for a wound that has been under the care of a nurse at the nursing home and has received dressing changes. The toe in question is her left second toe. Wound is on the plantar surface and the toe has a dusky appearance to it. Patient is uncooperative.  Past Medical History:  Diagnosis Date  . A-fib (HCC)   . Allergic rhinitis   . Alzheimer disease   . CAD (coronary artery disease)   . Cardiomyopathy (HCC)   . CHF (congestive heart failure) (HCC)   . COPD (chronic obstructive pulmonary disease) (HCC)   . Dementia   . Diabetes mellitus without complication (HCC)   . Diverticulosis   . Eczema   . Hyperlipemia   . Hypertension   . Urinary incontinence     Patient Active Problem List   Diagnosis Date Noted  . Sinus tachycardia 12/09/2015    Past Surgical History:  Procedure Laterality Date  . HYSTEROTOMY    . none    . PACEMAKER PLACEMENT      Allergies Patient has no known allergies.  Social History Social History  Substance Use Topics  . Smoking status: Former Games developer  . Smokeless tobacco: Former Neurosurgeon  . Alcohol use No    Review of Systems Musculoskeletal: positive for wound to the left second toe Skin: ulceration noted over the proximal plantar aspect of the left second toe  All systems negative/normal/unremarkable except as stated in the HPI  ____________________________________________   PHYSICAL EXAM:  VITAL SIGNS: ED Triage Vitals  Enc Vitals Group     BP       Pulse      Resp      Temp      Temp src      SpO2      Weight      Height      Head Circumference      Peak Flow      Pain Score      Pain Loc      Pain Edu?      Excl. in GC?    Constitutional: Alert but disoriented, no distress. She is sometimes combative. Cardiovascular: Normal rate, regular rhythm. No murmurs, rubs, or gallops. Respiratory: Normal respiratory effort without tachypnea nor retractions. Breath sounds are clear and equal bilaterally. No wheezes/rales/rhonchi. Gastrointestinal: Soft and nontender. Normal bowel sounds Musculoskeletal: Nontender with normal range of motion in extremities. No lower extremity tenderness nor edema. Neurologic:  Normal speech and language. No gross focal neurologic deficits are appreciated.  Skin: ulceration is noted over the plantar aspect of the left second toe, toe has a dusky cyanotic appearance Psychiatric: mood is normal, she is uncooperative to examination ____________________________________________  ED COURSE:  Pertinent labs & imaging results that were available during my care of the patient were reviewed by me and considered in my medical decision making (see chart for details). Patient presents for a wound to her left foot. we will assess with labs and imaging as indicated.   Procedures ____________________________________________  LABS (pertinent positives/negatives)  Labs Reviewed  CBC WITH DIFFERENTIAL/PLATELET - Abnormal; Notable for the following:       Result Value   RBC 3.20 (*)    MCV 110.1 (*)    MCH 37.9 (*)    RDW 14.9 (*)    All other components within normal limits  COMPREHENSIVE METABOLIC PANEL - Abnormal; Notable for the following:    CO2 19 (*)    Calcium 8.4 (*)    Total Protein 6.3 (*)    Albumin 2.6 (*)    ALT 13 (*)    All other components within normal limits    RADIOLOGY Images were viewed by me  left second toe x-ray IMPRESSION: Diffuse osteopenia, which limits evaluation. No  definite radiographic evidence of osteomyelitis. No acute osseous abnormality. ____________________________________________  DIFFERENTIAL DIAGNOSIS   cellulitis, diabetic ulceration, osteomyelitis, skin excoriation  FINAL ASSESSMENT AND PLAN  skin ulceration, peripheral vascular disease   Plan: Patient's labs and imaging were dictated above. Patient had presented for a change in appearance of her left second toe. There is a dusky appearance to the toe but otherwise the foot has excellent blood flow including strong dopplerable dorsalis pedis. Given her advanced dementia she is stable to be seen as an outpatient on Monday. We will place on antibiotics and ensure she is continuing her Eliquis. I will place her on Keflex and Bactroban ointment. She is stable for discharge.   Emily Filbert, MD   Note: This note was generated in part or whole with voice recognition software. Voice recognition is usually quite accurate but there are transcription errors that can and very often do occur. I apologize for any typographical errors that were not detected and corrected.     Emily Filbert, MD 02/12/17 Windy Fast

## 2017-02-19 ENCOUNTER — Encounter: Payer: Self-pay | Admitting: Emergency Medicine

## 2017-02-19 ENCOUNTER — Inpatient Hospital Stay
Admission: EM | Admit: 2017-02-19 | Discharge: 2017-03-03 | DRG: 853 | Disposition: A | Discharge status: Home / Self Care | Payer: Medicare Other | Attending: Internal Medicine | Admitting: Internal Medicine

## 2017-02-19 ENCOUNTER — Encounter: Payer: Medicare Other | Attending: Surgery | Admitting: Surgery

## 2017-02-19 ENCOUNTER — Emergency Department: Payer: Medicare Other

## 2017-02-19 DIAGNOSIS — I429 Cardiomyopathy, unspecified: Secondary | ICD-10-CM | POA: Insufficient documentation

## 2017-02-19 DIAGNOSIS — Z825 Family history of asthma and other chronic lower respiratory diseases: Secondary | ICD-10-CM

## 2017-02-19 DIAGNOSIS — I4891 Unspecified atrial fibrillation: Secondary | ICD-10-CM | POA: Insufficient documentation

## 2017-02-19 DIAGNOSIS — E876 Hypokalemia: Secondary | ICD-10-CM | POA: Diagnosis present

## 2017-02-19 DIAGNOSIS — L8990 Pressure ulcer of unspecified site, unspecified stage: Secondary | ICD-10-CM | POA: Insufficient documentation

## 2017-02-19 DIAGNOSIS — Z993 Dependence on wheelchair: Secondary | ICD-10-CM

## 2017-02-19 DIAGNOSIS — F0280 Dementia in other diseases classified elsewhere without behavioral disturbance: Secondary | ICD-10-CM | POA: Diagnosis present

## 2017-02-19 DIAGNOSIS — I96 Gangrene, not elsewhere classified: Secondary | ICD-10-CM | POA: Diagnosis not present

## 2017-02-19 DIAGNOSIS — E1152 Type 2 diabetes mellitus with diabetic peripheral angiopathy with gangrene: Secondary | ICD-10-CM | POA: Diagnosis present

## 2017-02-19 DIAGNOSIS — I110 Hypertensive heart disease with heart failure: Secondary | ICD-10-CM | POA: Insufficient documentation

## 2017-02-19 DIAGNOSIS — Z7901 Long term (current) use of anticoagulants: Secondary | ICD-10-CM | POA: Diagnosis not present

## 2017-02-19 DIAGNOSIS — K5900 Constipation, unspecified: Secondary | ICD-10-CM | POA: Diagnosis present

## 2017-02-19 DIAGNOSIS — G309 Alzheimer's disease, unspecified: Secondary | ICD-10-CM | POA: Diagnosis present

## 2017-02-19 DIAGNOSIS — L03031 Cellulitis of right toe: Secondary | ICD-10-CM

## 2017-02-19 DIAGNOSIS — M79605 Pain in left leg: Secondary | ICD-10-CM | POA: Diagnosis not present

## 2017-02-19 DIAGNOSIS — Z79899 Other long term (current) drug therapy: Secondary | ICD-10-CM

## 2017-02-19 DIAGNOSIS — L97529 Non-pressure chronic ulcer of other part of left foot with unspecified severity: Secondary | ICD-10-CM | POA: Diagnosis present

## 2017-02-19 DIAGNOSIS — I509 Heart failure, unspecified: Secondary | ICD-10-CM | POA: Insufficient documentation

## 2017-02-19 DIAGNOSIS — I2510 Atherosclerotic heart disease of native coronary artery without angina pectoris: Secondary | ICD-10-CM | POA: Diagnosis present

## 2017-02-19 DIAGNOSIS — I5032 Chronic diastolic (congestive) heart failure: Secondary | ICD-10-CM | POA: Diagnosis present

## 2017-02-19 DIAGNOSIS — Z8249 Family history of ischemic heart disease and other diseases of the circulatory system: Secondary | ICD-10-CM

## 2017-02-19 DIAGNOSIS — E11621 Type 2 diabetes mellitus with foot ulcer: Secondary | ICD-10-CM | POA: Diagnosis present

## 2017-02-19 DIAGNOSIS — L97519 Non-pressure chronic ulcer of other part of right foot with unspecified severity: Secondary | ICD-10-CM | POA: Diagnosis present

## 2017-02-19 DIAGNOSIS — L89223 Pressure ulcer of left hip, stage 3: Secondary | ICD-10-CM | POA: Diagnosis present

## 2017-02-19 DIAGNOSIS — A419 Sepsis, unspecified organism: Secondary | ICD-10-CM | POA: Diagnosis present

## 2017-02-19 DIAGNOSIS — Z7951 Long term (current) use of inhaled steroids: Secondary | ICD-10-CM

## 2017-02-19 DIAGNOSIS — Z7984 Long term (current) use of oral hypoglycemic drugs: Secondary | ICD-10-CM | POA: Diagnosis not present

## 2017-02-19 DIAGNOSIS — Z87891 Personal history of nicotine dependence: Secondary | ICD-10-CM | POA: Diagnosis not present

## 2017-02-19 DIAGNOSIS — I480 Paroxysmal atrial fibrillation: Secondary | ICD-10-CM | POA: Diagnosis present

## 2017-02-19 DIAGNOSIS — Z950 Presence of cardiac pacemaker: Secondary | ICD-10-CM | POA: Diagnosis not present

## 2017-02-19 DIAGNOSIS — Z66 Do not resuscitate: Secondary | ICD-10-CM | POA: Diagnosis present

## 2017-02-19 DIAGNOSIS — L97523 Non-pressure chronic ulcer of other part of left foot with necrosis of muscle: Secondary | ICD-10-CM | POA: Insufficient documentation

## 2017-02-19 DIAGNOSIS — J449 Chronic obstructive pulmonary disease, unspecified: Secondary | ICD-10-CM | POA: Diagnosis present

## 2017-02-19 DIAGNOSIS — L03032 Cellulitis of left toe: Secondary | ICD-10-CM | POA: Insufficient documentation

## 2017-02-19 DIAGNOSIS — M1990 Unspecified osteoarthritis, unspecified site: Secondary | ICD-10-CM | POA: Insufficient documentation

## 2017-02-19 DIAGNOSIS — Z7401 Bed confinement status: Secondary | ICD-10-CM

## 2017-02-19 LAB — LACTIC ACID, PLASMA: LACTIC ACID, VENOUS: 3.2 mmol/L — AB (ref 0.5–1.9)

## 2017-02-19 LAB — CBC WITH DIFFERENTIAL/PLATELET
BASOS ABS: 0.1 10*3/uL (ref 0–0.1)
BASOS PCT: 1 %
Eosinophils Absolute: 0 10*3/uL (ref 0–0.7)
Eosinophils Relative: 0 %
HEMATOCRIT: 36 % (ref 35.0–47.0)
Hemoglobin: 12.5 g/dL (ref 12.0–16.0)
LYMPHS PCT: 27 %
Lymphs Abs: 2 10*3/uL (ref 1.0–3.6)
MCH: 37.9 pg — ABNORMAL HIGH (ref 26.0–34.0)
MCHC: 34.7 g/dL (ref 32.0–36.0)
MCV: 109.4 fL — AB (ref 80.0–100.0)
Monocytes Absolute: 0.7 10*3/uL (ref 0.2–0.9)
Monocytes Relative: 10 %
NEUTROS ABS: 4.5 10*3/uL (ref 1.4–6.5)
Neutrophils Relative %: 62 %
PLATELETS: 422 10*3/uL (ref 150–440)
RBC: 3.29 MIL/uL — AB (ref 3.80–5.20)
RDW: 15.4 % — ABNORMAL HIGH (ref 11.5–14.5)
WBC: 7.3 10*3/uL (ref 3.6–11.0)

## 2017-02-19 LAB — HEMOGLOBIN A1C
HEMOGLOBIN A1C: 5 % (ref 4.8–5.6)
MEAN PLASMA GLUCOSE: 96.8 mg/dL

## 2017-02-19 LAB — MRSA PCR SCREENING: MRSA by PCR: NEGATIVE

## 2017-02-19 LAB — GLUCOSE, CAPILLARY: GLUCOSE-CAPILLARY: 83 mg/dL (ref 65–99)

## 2017-02-19 MED ORDER — POLYETHYLENE GLYCOL 3350 17 G PO PACK: 17.0000 g | PACK | Freq: Every day | ORAL | Status: DC | PRN

## 2017-02-19 MED ORDER — DILTIAZEM HCL 30 MG PO TABS
60.0000 mg | ORAL_TABLET | Freq: Two times a day (BID) | ORAL | Status: DC
  Administered 2017-02-20 – 2017-02-26 (×10): 60 mg via ORAL
  Filled 2017-02-19: qty 1
  Filled 2017-02-19 (×2): qty 2
  Filled 2017-02-19: qty 1
  Filled 2017-02-19 (×3): qty 2
  Filled 2017-02-19 (×2): qty 1
  Filled 2017-02-19: qty 2
  Filled 2017-02-19: qty 1
  Filled 2017-02-19 (×2): qty 2
  Filled 2017-02-19: qty 1
  Filled 2017-02-19 (×2): qty 2
  Filled 2017-02-19 (×3): qty 1
  Filled 2017-02-19 (×2): qty 2

## 2017-02-19 MED ORDER — ENOXAPARIN SODIUM 40 MG/0.4ML ~~LOC~~ SOLN
40.0000 mg | SUBCUTANEOUS | Status: DC
  Administered 2017-02-20 – 2017-02-26 (×4): 40 mg via SUBCUTANEOUS
  Filled 2017-02-19 (×7): qty 0.4

## 2017-02-19 MED ORDER — ONDANSETRON HCL 4 MG PO TABS: 4.0000 mg | ORAL_TABLET | Freq: Four times a day (QID) | ORAL | Status: DC | PRN

## 2017-02-19 MED ORDER — ONDANSETRON HCL 4 MG/2ML IJ SOLN
4.0000 mg | Freq: Four times a day (QID) | INTRAMUSCULAR | Status: DC | PRN
  Administered 2017-02-25: 4 mg via INTRAVENOUS

## 2017-02-19 MED ORDER — DOCUSATE SODIUM 100 MG PO CAPS
100.0000 mg | ORAL_CAPSULE | Freq: Two times a day (BID) | ORAL | Status: DC
  Administered 2017-02-20 – 2017-03-03 (×18): 100 mg via ORAL
  Filled 2017-02-19 (×21): qty 1

## 2017-02-19 MED ORDER — TRAMADOL HCL 50 MG PO TABS
50.0000 mg | ORAL_TABLET | Freq: Four times a day (QID) | ORAL | Status: DC | PRN
  Administered 2017-02-26 – 2017-03-01 (×3): 50 mg via ORAL
  Filled 2017-02-19 (×3): qty 1

## 2017-02-19 MED ORDER — MIRTAZAPINE 15 MG PO TABS
7.5000 mg | ORAL_TABLET | Freq: Every day | ORAL | Status: DC
  Administered 2017-02-19 – 2017-03-02 (×11): 7.5 mg via ORAL
  Filled 2017-02-19 (×12): qty 1

## 2017-02-19 MED ORDER — ACETAMINOPHEN 650 MG RE SUPP: 650.0000 mg | Freq: Four times a day (QID) | RECTAL | Status: DC | PRN

## 2017-02-19 MED ORDER — BISACODYL 10 MG RE SUPP: 10.0000 mg | Freq: Every day | RECTAL | Status: DC | PRN

## 2017-02-19 MED ORDER — SODIUM CHLORIDE 0.9 % IV BOLUS (SEPSIS)
500.0000 mL | Freq: Once | INTRAVENOUS | Status: AC
  Administered 2017-02-19: 500 mL via INTRAVENOUS

## 2017-02-19 MED ORDER — POLYETHYLENE GLYCOL 3350 17 G PO PACK: 17.0000 g | PACK | ORAL | Status: DC

## 2017-02-19 MED ORDER — ACETAMINOPHEN 325 MG PO TABS: 650.0000 mg | ORAL_TABLET | Freq: Four times a day (QID) | ORAL | Status: DC | PRN

## 2017-02-19 MED ORDER — PIPERACILLIN-TAZOBACTAM 3.375 G IVPB 30 MIN
INTRAVENOUS | Status: AC
  Filled 2017-02-19: qty 50

## 2017-02-19 MED ORDER — INSULIN ASPART 100 UNIT/ML ~~LOC~~ SOLN
0.0000 [IU] | Freq: Three times a day (TID) | SUBCUTANEOUS | Status: DC
  Administered 2017-02-22: 1 [IU] via SUBCUTANEOUS
  Filled 2017-02-19: qty 1

## 2017-02-19 MED ORDER — PIPERACILLIN-TAZOBACTAM 3.375 G IVPB 30 MIN
3.3750 g | Freq: Once | INTRAVENOUS | Status: AC
  Administered 2017-02-19: 3.375 g via INTRAVENOUS

## 2017-02-19 MED ORDER — ALBUTEROL SULFATE (2.5 MG/3ML) 0.083% IN NEBU: 2.5000 mg | INHALATION_SOLUTION | RESPIRATORY_TRACT | Status: DC | PRN

## 2017-02-19 MED ORDER — METOPROLOL TARTRATE 50 MG PO TABS
50.0000 mg | ORAL_TABLET | Freq: Two times a day (BID) | ORAL | Status: DC
  Filled 2017-02-19 (×2): qty 1

## 2017-02-19 MED ORDER — PIPERACILLIN-TAZOBACTAM 3.375 G IVPB
3.3750 g | Freq: Three times a day (TID) | INTRAVENOUS | Status: DC
  Administered 2017-02-20 – 2017-03-01 (×25): 3.375 g via INTRAVENOUS
  Filled 2017-02-19 (×25): qty 50

## 2017-02-19 MED ORDER — SODIUM CHLORIDE 0.9 % IV BOLUS (SEPSIS)
1000.0000 mL | Freq: Once | INTRAVENOUS | Status: AC
  Administered 2017-02-19: 1000 mL via INTRAVENOUS

## 2017-02-19 MED ORDER — POTASSIUM CHLORIDE 20 MEQ/15ML (10%) PO SOLN
20.0000 meq | Freq: Every day | ORAL | Status: DC
  Administered 2017-02-22 – 2017-03-03 (×9): 20 meq via ORAL
  Filled 2017-02-19 (×16): qty 15

## 2017-02-19 MED ORDER — LORATADINE 10 MG PO TABS
10.0000 mg | ORAL_TABLET | Freq: Every day | ORAL | Status: DC
  Administered 2017-02-20 – 2017-03-03 (×11): 10 mg via ORAL
  Filled 2017-02-19 (×11): qty 1

## 2017-02-19 NOTE — ED Triage Notes (Addendum)
Pt to ed from [facility] with caregiver and c/o left foot 2nd toe pain and infection.  Pt was seen here last Friday for same, sent to wound care center for follow up and then seen there today, sent back to ED for eval of removal of toe.

## 2017-02-19 NOTE — Progress Notes (Signed)
Pharmacy Antibiotic Note  [name] [name] is a 83 y.o. female admitted on 02/19/2017 with left third toe gangrene. Pharmacy has been consulted for Zosyn dosing.  Plan: Zosyn 3.375g IV q8h (4 hour infusion).  Weight: 120 lb (54.4 kg)  Temp (24hrs), Avg:97.3 F (36.3 C), Min:96.7 F (35.9 C), Max:97.9 F (36.6 C)   Recent Labs Lab 02/19/17 1641  WBC 7.3  LATICACIDVEN 3.2*    CrCl cannot be calculated (Unknown ideal weight.).    No Known Allergies  Antimicrobials this admission: Zosyn 10/12 >>   Dose adjustments this admission:  Microbiology results: 10/12 BCx: sent  MRSA PCR: sent  Thank you for allowing pharmacy to be a part of this patient's care.  Luisa Hart D 02/19/2017 8:08 PM

## 2017-02-19 NOTE — H&P (Signed)
[facility] [place] [facility]   PATIENT NAME: [name] [name]    MR#:  [id number]  DATE OF BIRTH:  Jul 22, 1929  DATE OF ADMISSION:  02/19/2017  PRIMARY CARE PHYSICIAN: Angela Cox, MD   REQUESTING/REFERRING PHYSICIAN: Dr. Darnelle Catalan  CHIEF COMPLAINT:   Chief Complaint  Patient presents with  . Foot Pain    HISTORY OF PRESENT ILLNESS:  [name] [name]  is a 83 y.o. female with a known history of COPD, diabetes, CHF, atrial fibrillation, dementia, wheelchair bound presents from assisted living facility due to left third toe gangrene. Patient was recently seen in the emergency room and referred to wound care center. She was seen in the wound care center and sent back to the emergency room for amputation. Here patient has been found to be tachycardic in the 140s along with lactic acid of 3.2 sepsis and is being admitted to the hospital. Patient has dementia and unable to provide any history. She is DO NOT RESUSCITATE. History obtained from her caretaker from assisted living facility.  PAST MEDICAL HISTORY:   Past Medical History:  Diagnosis Date  . A-fib (HCC)   . Allergic rhinitis   . Alzheimer disease   . CAD (coronary artery disease)   . Cardiomyopathy (HCC)   . CHF (congestive heart failure) (HCC)   . COPD (chronic obstructive pulmonary disease) (HCC)   . Dementia   . Diabetes mellitus without complication (HCC)   . Diverticulosis   . Eczema   . Hyperlipemia   . Hypertension   . Urinary incontinence     PAST SURGICAL HISTORY:   Past Surgical History:  Procedure Laterality Date  . HYSTEROTOMY    . none    . PACEMAKER PLACEMENT      SOCIAL HISTORY:   Social History  Substance Use Topics  . Smoking status: Former Games developer  . Smokeless tobacco: Former Neurosurgeon  . Alcohol use No    FAMILY HISTORY:   Family History  Problem Relation Age of Onset  . Hypertension Father     DRUG ALLERGIES:  No Known Allergies  REVIEW OF SYSTEMS:   Review of  Systems  Unable to perform ROS: Dementia    MEDICATIONS AT HOME:   Prior to Admission medications   Medication Sig Start Date End Date Taking? Authorizing Provider  acetaminophen (TYLENOL) 500 MG tablet Take 1,000 mg by mouth 3 (three) times daily. Take for arthritis pain.    [provider]  albuterol (PROVENTIL HFA;VENTOLIN HFA) 108 (90 Base) MCG/ACT inhaler Inhale 2 puffs into the lungs every 6 (six) hours as needed for wheezing or shortness of breath.    [provider]  amLODipine (NORVASC) 5 MG tablet Take 5 mg by mouth daily.    [provider]  apixaban (ELIQUIS) 5 MG TABS tablet Take 1 tablet (5 mg total) by mouth 2 (two) times daily. 12/11/15   Delfino Lovett, MD  cephALEXin (KEFLEX) 250 MG capsule Take 1 capsule (250 mg total) by mouth 4 (four) times daily. 02/12/17 02/22/17  Emily Filbert, MD  diclofenac sodium (VOLTAREN) 1 % GEL Apply 2 g topically 2 (two) times daily.    [provider]  diltiazem (CARDIZEM) 60 MG tablet Take 1 tablet (60 mg total) by mouth 2 (two) times daily. 12/11/15   Delfino Lovett, MD  fexofenadine (ALLEGRA) 180 MG tablet Take 180 mg by mouth daily.    [provider]  fluticasone (FLONASE) 50 MCG/ACT nasal spray Place 1 spray into both  nostrils daily.    [provider]  furosemide (LASIX) 40 MG tablet Take 40 mg by mouth daily.    [provider]  glipiZIDE (GLUCOTROL) 5 MG tablet Take 5 mg by mouth daily.     [provider]  ipratropium (ATROVENT) 0.02 % nebulizer solution Take 0.5 mg by nebulization daily.    [provider]  metFORMIN (GLUCOPHAGE) 1000 MG tablet Take 1,000 mg by mouth 2 (two) times daily with a meal.    [provider]  metoprolol tartrate (LOPRESSOR) 25 mg/10 mL SUSP Take 50 mg by mouth 2 (two) times daily.    [provider]  mirtazapine (REMERON) 7.5 MG tablet Take 7.5 mg by mouth at bedtime.    [provider]  montelukast  (SINGULAIR) 10 MG tablet Take 10 mg by mouth every morning.     [provider]  mupirocin ointment (BACTROBAN) 2 % Apply to affected area 3 times daily 02/12/17 02/12/18  Emily Filbert, MD  omeprazole (PRILOSEC) 20 MG capsule Take 20 mg by mouth 2 (two) times daily before a meal. Take 30 minutes before breakfast.    [provider]  polyethylene glycol (MIRALAX / GLYCOLAX) packet Take 17 g by mouth every other day.    [provider]  potassium chloride 20 MEQ/15ML (10%) SOLN Take 20 mEq by mouth daily.    [provider]     VITAL SIGNS:  Blood pressure 96/74, pulse (!) 122, temperature 97.9 F (36.6 C), resp. rate 18, weight 54.4 kg (120 lb), SpO2 96 %.  PHYSICAL EXAMINATION:  Physical Exam  GENERAL:  83 y.o.-year-old patient lying in the bed with no acute distress.  EYES: Pupils equal, round, reactive to light and accommodation. No scleral icterus. Extraocular muscles intact.  HEENT: Head atraumatic, normocephalic. Oropharynx and nasopharynx clear. No oropharyngeal erythema, moist oral mucosa  NECK:  Supple, no jugular venous distention. No thyroid enlargement, no tenderness.  LUNGS: Normal breath sounds bilaterally, no wheezing, rales, rhonchi. No use of accessory muscles of respiration.  CARDIOVASCULAR: S1, S2 normal. No murmurs, rubs, or gallops.  ABDOMEN: Soft, nontender, nondistended. Bowel sounds present. No organomegaly or mass.  EXTREMITIES: No pedal edema, cyanosis, or clubbing. + 2 pedal & radial pulses b/l.   NEUROLOGIC: Moves all 4 extremities PSYCHIATRIC: The patient is alert and  awake. Presently confused SKIN: Left third toe black and gangrenous without any discharge. Surrounding erythema present.  LABORATORY PANEL:   CBC  Recent Labs Lab 02/19/17 1641  WBC 7.3  HGB 12.5  HCT 36.0  PLT 422   ------------------------------------------------------------------------------------------------------------------  Chemistries   No results for input(s): NA, K, CL, CO2, GLUCOSE, BUN, CREATININE, CALCIUM, MG, AST, ALT, ALKPHOS, BILITOT in the last 168 hours.  Invalid input(s): GFRCGP ------------------------------------------------------------------------------------------------------------------  Cardiac Enzymes No results for input(s): TROPONINI in the last 168 hours. ------------------------------------------------------------------------------------------------------------------  RADIOLOGY:  Dg Foot Complete Left  Result Date: 02/19/2017 CLINICAL DATA:  Left second toe gangrene. EXAM: LEFT FOOT - COMPLETE 3+ VIEW COMPARISON:  Left second toe x-rays dated February 12, 2017. FINDINGS: The bones are diffusely osteopenic, which limits evaluation. No definite osteolysis or cortical destruction. No acute fracture or malalignment. Prominent hammertoe deformities of the second through fifth digits. Diffuse soft tissue swelling about the foot. IMPRESSION: No definite radiographic evidence of osteomyelitis. Diffuse soft tissue swelling about the foot heart HO. Electronically Signed   By: Obie Dredge M.D.   On: 02/19/2017 17:03     IMPRESSION AND PLAN:   *  Left third toe gangrene with cellulitis. Sepsis present on admission. Patient tachycardic in the 140s. We will bolus 1.5 L normal saline stat. Check lactic acid in 3 hours. Start IV Zosyn. Check blood cultures. Consult podiatry for amputation of the toe.  * Chronic diastolic congestive heart failure. No signs of fluid overload.  * Diabetes mellitus. Hold oral hypoglycemics. Start sliding scale insulin.  * Dementia. Monitor for inpatient delirium.  * Paroxysmal atrial fibrillation. Start Cardizem and metoprolol orally. Hold Eliquis for surgery.  DVT prophylaxis with Lovenox.  All the records are reviewed and case discussed with ED provider. Management plans discussed with the patient, family and they are in agreement.  CODE STATUS: DNR  TOTAL CC TIME TAKING  CARE OF THIS PATIENT: 40 minutes.   Milagros Loll R M.D on 02/19/2017 at 6:48 PM  Between 7am to 6pm - Pager - [phone]  After 6pm go to [email or web address] - password [email or web address] [facility]  [facility]  Office  [phone]  CC: Primary care physician; Angela Cox, MD  Note: This dictation was prepared with Dragon dictation along with smaller phrase technology. Any transcriptional errors that result from this process are unintentional.

## 2017-02-19 NOTE — ED Notes (Signed)
Unable to get blood or IV on pt at this time.  Attempted x 1 unsuccessful.

## 2017-02-19 NOTE — ED Notes (Signed)
Phlebotomist able to get 1 set of blood cultures but no lactic acid after 4 attempts

## 2017-02-19 NOTE — ED Provider Notes (Signed)
Took phone call from Wound Care physician Dr. Meyer Russel - sending patient to ED for wet gangrene of toe.   Governor Rooks, MD 02/19/17 1115

## 2017-02-19 NOTE — ED Notes (Signed)
Lab called to report lactic acid of 3.2, Dr. Juliette Alcide will be informed.

## 2017-02-19 NOTE — ED Notes (Signed)
Lab contacted to send phlebotomist for sepsis work up blood on patient. Multiple unsuccessful attempts made

## 2017-02-19 NOTE — ED Notes (Signed)
Pt. Here with caregiver.  Pt. Has necrosis lt. 2nd toe.  Pt. Caregiver states pt. Was here last Friday and has been to see wound center.  Swelling to lt. Foot, with pain when palpated.

## 2017-02-19 NOTE — ED Provider Notes (Signed)
[facility] Emergency Department Provider Note   ____________________________________________   First MD Initiated Contact with Patient 02/19/17 1632     (approximate)  I have reviewed the triage vital signs and the nursing notes.   HISTORY  Chief Complaint Foot Pain   HPI [name] [name] is a 83 y.o. female who was seen in the ER and sent to the wound center for infection in the left great toe. [facility] Center had her follow-up and then sent her back here for possible amputation. There concerned about her getting septic and getting worse. She does have pus coming around the proximal part of the toe most of the toe is mollified and black and there is some swelling and redness on the sole of the foot around the [facility] T joints.   Past Medical History:  Diagnosis Date  . A-fib (HCC)   . Allergic rhinitis   . Alzheimer disease   . CAD (coronary artery disease)   . Cardiomyopathy (HCC)   . CHF (congestive heart failure) (HCC)   . COPD (chronic obstructive pulmonary disease) (HCC)   . Dementia   . Diabetes mellitus without complication (HCC)   . Diverticulosis   . Eczema   . Hyperlipemia   . Hypertension   . Urinary incontinence     Patient Active Problem List   Diagnosis Date Noted  . Gangrene of toe of left foot (HCC) 02/19/2017  . Sinus tachycardia 12/09/2015    Past Surgical History:  Procedure Laterality Date  . HYSTEROTOMY    . none    . PACEMAKER PLACEMENT      Prior to Admission medications   Medication Sig Start Date End Date Taking? Authorizing Provider  acetaminophen (TYLENOL) 500 MG tablet Take 1,000 mg by mouth 3 (three) times daily. Take for arthritis pain.    [provider]  albuterol (PROVENTIL HFA;VENTOLIN HFA) 108 (90 Base) MCG/ACT inhaler Inhale 2 puffs into the lungs every 6 (six) hours as needed for wheezing or shortness of breath.    [provider]  amLODipine (NORVASC) 5 MG tablet Take 5 mg by mouth daily.     [provider]  apixaban (ELIQUIS) 5 MG TABS tablet Take 1 tablet (5 mg total) by mouth 2 (two) times daily. 12/11/15   Delfino Lovett, MD  cephALEXin (KEFLEX) 250 MG capsule Take 1 capsule (250 mg total) by mouth 4 (four) times daily. 02/12/17 02/22/17  Emily Filbert, MD  diclofenac sodium (VOLTAREN) 1 % GEL Apply 2 g topically 2 (two) times daily.    [provider]  diltiazem (CARDIZEM) 60 MG tablet Take 1 tablet (60 mg total) by mouth 2 (two) times daily. 12/11/15   Delfino Lovett, MD  fexofenadine (ALLEGRA) 180 MG tablet Take 180 mg by mouth daily.    [provider]  fluticasone (FLONASE) 50 MCG/ACT nasal spray Place 1 spray into both nostrils daily.    [provider]  furosemide (LASIX) 40 MG tablet Take 40 mg by mouth daily.    [provider]  glipiZIDE (GLUCOTROL) 5 MG tablet Take 5 mg by mouth daily.     [provider]  ipratropium (ATROVENT) 0.02 % nebulizer solution Take 0.5 mg by nebulization daily.    [provider]  metFORMIN (GLUCOPHAGE) 1000 MG tablet Take 1,000 mg by mouth 2 (two) times daily with a meal.    [provider]  metoprolol tartrate (LOPRESSOR) 25 mg/10 mL SUSP Take 50 mg by mouth 2 (two)  times daily.    [provider]  mirtazapine (REMERON) 7.5 MG tablet Take 7.5 mg by mouth at bedtime.    [provider]  montelukast (SINGULAIR) 10 MG tablet Take 10 mg by mouth every morning.     [provider]  mupirocin ointment (BACTROBAN) 2 % Apply to affected area 3 times daily 02/12/17 02/12/18  Emily Filbert, MD  omeprazole (PRILOSEC) 20 MG capsule Take 20 mg by mouth 2 (two) times daily before a meal. Take 30 minutes before breakfast.    [provider]  polyethylene glycol (MIRALAX / GLYCOLAX) packet Take 17 g by mouth every other day.    [provider]  potassium chloride 20 MEQ/15ML (10%) SOLN Take 20 mEq by mouth daily.    [provider]      Allergies Patient has no known allergies.  Family History  Problem Relation Age of Onset  . Hypertension Father     Social History Social History  Substance Use Topics  . Smoking status: Former Games developer  . Smokeless tobacco: Former Neurosurgeon  . Alcohol use No    Review of Systems  Constitutional: No fever/chills Eyes: No visual changes. ENT: No sore throat. Cardiovascular: Denies chest pain. Respiratory: Denies shortness of breath. Gastrointestinal: No abdominal pain.  No nausea, no vomiting.  No diarrhea.  No constipation. Genitourinary: Negative for dysuria. Musculoskeletal: Negative for back pain. Skin: Negative for rash. Neurological: Negative for headaches, focal weakness   ____________________________________________   PHYSICAL EXAM:  VITAL SIGNS: ED Triage Vitals  Enc Vitals Group     BP 02/19/17 1222 96/74     Pulse Rate 02/19/17 1222 100     Resp 02/19/17 1222 16     Temp 02/19/17 1222 97.9 F (36.6 C)     Temp src --      SpO2 02/19/17 1222 96 %     Weight 02/19/17 1224 120 lb (54.4 kg)     Height --      Head Circumference --      Peak Flow --      Pain Score --      Pain Loc --      Pain Edu? --      Excl. in GC? --     Constitutional: Alert and oriented. Well appearing and in no acute distress.Toe however smells bad. He had a necrotic flushes easily apparent Eyes: Conjunctivae are normal. Head: Atraumatic. Nose: No congestion/rhinnorhea. Mouth/Throat: Mucous membranes are moist.  Oropharynx non-erythematous. Neck: No stridor Cardiovascular: Normal rate, regular rhythm. Grossly normal heart sounds.  Good peripheral circulation. Respiratory: Normal respiratory effort.  No retractions. Lungs CTAB. Gastrointestinal: Soft and nontender. No distention. No abdominal bruits. No CVA tenderness. Musculoskeletal: No lower extremity tenderness nor edema.  No joint effusions. Neurologic:  Normal speech and language. No gross focal neurologic deficits are  appreciated.  Skin:  Skin is warm, dry and intact Except in the foot as described with the toe necrosis pus and cellulitis. No rash noted. Psychiatric: Mood and affect are normal. Speech and behavior are normal.  ____________________________________________   LABS (all labs ordered are listed, but only abnormal results are displayed)  Labs Reviewed  LACTIC ACID, PLASMA - Abnormal; Notable for the following:       Result Value   Lactic Acid, Venous 3.2 (*)    All other components within normal limits  CBC WITH DIFFERENTIAL/PLATELET - Abnormal; Notable for the following:    RBC 3.29 (*)    MCV 109.4 (*)  MCH 37.9 (*)    RDW 15.4 (*)    All other components within normal limits  CULTURE, BLOOD (ROUTINE X 2)  CULTURE, BLOOD (ROUTINE X 2)  COMPREHENSIVE METABOLIC PANEL  LACTIC ACID, PLASMA  HEMOGLOBIN A1C  BASIC METABOLIC PANEL  CBC   ____________________________________________  EKG  ____________________________________________  RADIOLOGY  Dg Foot Complete Left  Result Date: 02/19/2017 CLINICAL DATA:  Left second toe gangrene. EXAM: LEFT FOOT - COMPLETE 3+ VIEW COMPARISON:  Left second toe x-rays dated February 12, 2017. FINDINGS: The bones are diffusely osteopenic, which limits evaluation. No definite osteolysis or cortical destruction. No acute fracture or malalignment. Prominent hammertoe deformities of the second through fifth digits. Diffuse soft tissue swelling about the foot. IMPRESSION: No definite radiographic evidence of osteomyelitis. Diffuse soft tissue swelling about the foot heart HO. Electronically Signed   By: Obie Dredge M.D.   On: 02/19/2017 17:03   X-ray read by radiology reviewed by me shows no acute pathology there is swelling as noted by radiology ____________________________________________   PROCEDURES  Procedure(s) performed  Procedures  Critical Care performed:  ____________________________________________   INITIAL IMPRESSION /  ASSESSMENT AND PLAN / ED COURSE  Patient wasn't obviously necrotic toe pus coming out proximally to the distal necrosis swelling and redness in the foot we will treat her for the cellulitis and then have to work up further to find out why she ended up with the necrotic toe. Patient does have a history of A. fib which she is on blood thinners that she should not affect her clot. She does have diabetes perhaps that the etiology of she had bad enough peripheral vascular disease. There is no history of trauma. He is a fair chance that this will result in more than the amputation of the toe.      ____________________________________________   FINAL CLINICAL IMPRESSION(S) / ED DIAGNOSES  Final diagnoses:  Cellulitis of toe of right foot      NEW MEDICATIONS STARTED DURING THIS VISIT:  New Prescriptions   No medications on file     Note:  This document was prepared using Dragon voice recognition software and may include unintentional dictation errors.    Arnaldo Natal, MD 02/19/17 [phone]

## 2017-02-20 DIAGNOSIS — L8990 Pressure ulcer of unspecified site, unspecified stage: Secondary | ICD-10-CM | POA: Insufficient documentation

## 2017-02-20 LAB — CBC
HEMATOCRIT: 32.5 % — AB (ref 35.0–47.0)
Hemoglobin: 11.3 g/dL — ABNORMAL LOW (ref 12.0–16.0)
MCH: 38.2 pg — ABNORMAL HIGH (ref 26.0–34.0)
MCHC: 34.7 g/dL (ref 32.0–36.0)
MCV: 110.2 fL — AB (ref 80.0–100.0)
PLATELETS: 340 10*3/uL (ref 150–440)
RBC: 2.94 MIL/uL — ABNORMAL LOW (ref 3.80–5.20)
RDW: 14.9 % — AB (ref 11.5–14.5)
WBC: 6.3 10*3/uL (ref 3.6–11.0)

## 2017-02-20 LAB — LACTIC ACID, PLASMA: Lactic Acid, Venous: 1.5 mmol/L (ref 0.5–1.9)

## 2017-02-20 LAB — BASIC METABOLIC PANEL
Anion gap: 9 (ref 5–15)
BUN: 9 mg/dL (ref 6–20)
CO2: 21 mmol/L — ABNORMAL LOW (ref 22–32)
CREATININE: 0.52 mg/dL (ref 0.44–1.00)
Calcium: 7.9 mg/dL — ABNORMAL LOW (ref 8.9–10.3)
Chloride: 107 mmol/L (ref 101–111)
GFR calc Af Amer: >60 mL/min (ref >60)
GLUCOSE: 101 mg/dL — AB (ref 65–99)
Potassium: 3.2 mmol/L — ABNORMAL LOW (ref 3.5–5.1)
SODIUM: 137 mmol/L (ref 135–145)

## 2017-02-20 LAB — GLUCOSE, CAPILLARY
GLUCOSE-CAPILLARY: 118 mg/dL — AB (ref 65–99)
Glucose-Capillary: 102 mg/dL — ABNORMAL HIGH (ref 65–99)
Glucose-Capillary: 97 mg/dL (ref 65–99)
Glucose-Capillary: 99 mg/dL (ref 65–99)

## 2017-02-20 NOTE — Progress Notes (Signed)
Sent Dr. Elpidio Anis text regarding patients BP/HR.

## 2017-02-20 NOTE — Progress Notes (Signed)
Patient pulled out her 2rd IV and the patient has still not received her bolus due from this morning.

## 2017-02-20 NOTE — Progress Notes (Signed)
[employer] [facility] - [place] at [facility]   PATIENT NAME: [name] [name]    MR#:  [id number]  DATE OF BIRTH:  04/02/1930  SUBJECTIVE:  CHIEF COMPLAINT:   Chief Complaint  Patient presents with  . Foot Pain   Patient is confused. Hypotensive and tachycardic. Afebrile today.  REVIEW OF SYSTEMS:    Review of Systems  Unable to perform ROS: Dementia    DRUG ALLERGIES:  No Known Allergies  VITALS:  Blood pressure (!) 88/61, pulse (!) 129, temperature 99.3 F (37.4 C), temperature source Oral, resp. rate 19, height 5\' 3"  (1.6 m), weight 46.7 kg (103 lb), SpO2 97 %.  PHYSICAL EXAMINATION:   Physical Exam  GENERAL:  83 y.o.-year-old patient lying in the bed with no acute distress.  EYES: Pupils equal, round, reactive to light and accommodation. No scleral icterus. Extraocular muscles intact.  HEENT: Head atraumatic, normocephalic. Oropharynx and nasopharynx clear.  NECK:  Supple, no jugular venous distention. No thyroid enlargement, no tenderness.  LUNGS: Normal breath sounds bilaterally, no wheezing, rales, rhonchi. No use of accessory muscles of respiration.  CARDIOVASCULAR: S1, S2 normal. No murmurs, rubs, or gallops.  ABDOMEN: Soft, nontender, nondistended. Bowel sounds present. No organomegaly or mass.  EXTREMITIES: No cyanosis, clubbing or edema b/l.    NEUROLOGIC: Cranial nerves II through XII are intact. No focal Motor or sensory deficits b/l.   PSYCHIATRIC: The patient is confused SKIN: left third toe is dry, black.  LABORATORY PANEL:   CBC  Recent Labs Lab 02/20/17 0551  WBC 6.3  HGB 11.3*  HCT 32.5*  PLT 340   ------------------------------------------------------------------------------------------------------------------ Chemistries   Recent Labs Lab 02/20/17 0551  NA 137  K 3.2*  CL 107  CO2 21*  GLUCOSE 101*  BUN 9  CREATININE 0.52  CALCIUM 7.9*    ------------------------------------------------------------------------------------------------------------------  Cardiac Enzymes No results for input(s): TROPONINI in the last 168 hours. ------------------------------------------------------------------------------------------------------------------  RADIOLOGY:  Dg Foot Complete Left  Result Date: 02/19/2017 CLINICAL DATA:  Left second toe gangrene. EXAM: LEFT FOOT - COMPLETE 3+ VIEW COMPARISON:  Left second toe x-rays dated February 12, 2017. FINDINGS: The bones are diffusely osteopenic, which limits evaluation. No definite osteolysis or cortical destruction. No acute fracture or malalignment. Prominent hammertoe deformities of the second through fifth digits. Diffuse soft tissue swelling about the foot. IMPRESSION: No definite radiographic evidence of osteomyelitis. Diffuse soft tissue swelling about the foot heart HO. Electronically Signed   By: February 14, 2017 M.D.   On: 02/19/2017 17:03     ASSESSMENT AND PLAN:   * Left third toe gangrene with cellulitis. Sepsis present on admission.  She still is tachycardic and hypotensive. We'll bolus 500 ML normal saline stat. IV Zosyn.  blood cultures No growth to date. Consulted podiatry for amputation of the toe.  * Chronic diastolic congestive heart failure. No signs of fluid overload.  * Diabetes mellitus. Hold oral hypoglycemics. Start sliding scale insulin.  * Dementia. Monitor for inpatient delirium.  * Paroxysmal atrial fibrillation. Started Cardizem. Hold metoprolol Hold Eliquis for surgery.  * DVT prophylaxis with Lovenox.  All the records are reviewed and case discussed with Care Management/Social Workerr. Management plans discussed with the patient, family and they are in agreement.  CODE STATUS: DNR  DVT Prophylaxis: SCDs  TOTAL CC TIME TAKING CARE OF THIS PATIENT: 2-3 minutes.   POSSIBLE D/C IN 1-2 DAYS, DEPENDING ON CLINICAL CONDITION.  04/21/2017 R  M.D on 02/20/2017 at 12:21 PM  Between 7am to 6pm - Pager -  [phone]  After 6pm go to [email or web address] - password [email or web address] [facility]  [facility] [place] [facility]  Office  [phone]  CC: Primary care physician; Angela Cox, MD  Note: This dictation was prepared with Dragon dictation along with smaller phrase technology. Any transcriptional errors that result from this process are unintentional.

## 2017-02-20 NOTE — Clinical Social Work Note (Signed)
CSW aware from chart review that the patient admitted from [facility]. CSW will assess when able.  Argentina Ponder, MSW, Theresia Majors [phone]

## 2017-02-21 LAB — GLUCOSE, CAPILLARY
GLUCOSE-CAPILLARY: 99 mg/dL (ref 65–99)
Glucose-Capillary: 71 mg/dL (ref 65–99)
Glucose-Capillary: 99 mg/dL (ref 65–99)
Glucose-Capillary: 117 mg/dL — ABNORMAL HIGH (ref 65–99)

## 2017-02-21 MED ORDER — SODIUM CHLORIDE 0.9 % IV BOLUS (SEPSIS)
500.0000 mL | Freq: Once | INTRAVENOUS | Status: AC
  Administered 2017-02-21: 500 mL via INTRAVENOUS

## 2017-02-21 MED ORDER — ADULT MULTIVITAMIN W/MINERALS CH
1.0000 | ORAL_TABLET | Freq: Every day | ORAL | Status: DC
  Administered 2017-02-22 – 2017-03-03 (×9): 1 via ORAL
  Filled 2017-02-21 (×9): qty 1

## 2017-02-21 MED ORDER — ENSURE ENLIVE PO LIQD
237.0000 mL | Freq: Three times a day (TID) | ORAL | Status: DC
  Administered 2017-02-21 – 2017-03-03 (×20): 237 mL via ORAL

## 2017-02-21 MED ORDER — SODIUM CHLORIDE 0.9 % IV BOLUS (SEPSIS): 500.0000 mL | Freq: Once | INTRAVENOUS | Status: DC

## 2017-02-21 NOTE — Progress Notes (Signed)
[employer] - [place] at [facility]   PATIENT NAME: [name] [name]    MR#:  [id number]  DATE OF BIRTH:  12/02/1929  SUBJECTIVE:  CHIEF COMPLAINT:   Chief Complaint  Patient presents with  . Foot Pain   Patient is confused.  BS- 71. Poor PO intake  REVIEW OF SYSTEMS:    Review of Systems  Unable to perform ROS: Dementia    DRUG ALLERGIES:  No Known Allergies  VITALS:  Blood pressure (!) 97/58, pulse 89, temperature 97.6 F (36.4 C), temperature source Oral, resp. rate 18, height 5\' 3"  (1.6 m), weight 48 kg (105 lb 14.4 oz), SpO2 92 %.  PHYSICAL EXAMINATION:   Physical Exam  GENERAL:  83 y.o.-year-old patient lying in the bed with no acute distress.  EYES: Pupils equal, round, reactive to light and accommodation. No scleral icterus. Extraocular muscles intact.  HEENT: Head atraumatic, normocephalic. Oropharynx and nasopharynx clear.  NECK:  Supple, no jugular venous distention. No thyroid enlargement, no tenderness.  LUNGS: Normal breath sounds bilaterally, no wheezing, rales, rhonchi. No use of accessory muscles of respiration.  CARDIOVASCULAR: S1, S2 normal. No murmurs, rubs, or gallops.  ABDOMEN: Soft, nontender, nondistended. Bowel sounds present. No organomegaly or mass.  EXTREMITIES: No cyanosis, clubbing or edema b/l.    NEUROLOGIC: Cranial nerves II through XII are intact. No focal Motor or sensory deficits b/l.   PSYCHIATRIC: The patient is confused SKIN: left second toe is dry, black.  LABORATORY PANEL:   CBC  Recent Labs Lab 02/20/17 0551  WBC 6.3  HGB 11.3*  HCT 32.5*  PLT 340   ------------------------------------------------------------------------------------------------------------------ Chemistries   Recent Labs Lab 02/20/17 0551  NA 137  K 3.2*  CL 107  CO2 21*  GLUCOSE 101*  BUN 9  CREATININE 0.52  CALCIUM 7.9*    ------------------------------------------------------------------------------------------------------------------  Cardiac Enzymes No results for input(s): TROPONINI in the last 168 hours. ------------------------------------------------------------------------------------------------------------------  RADIOLOGY:  Dg Foot Complete Left  Result Date: 02/19/2017 CLINICAL DATA:  Left second toe gangrene. EXAM: LEFT FOOT - COMPLETE 3+ VIEW COMPARISON:  Left second toe x-rays dated February 12, 2017. FINDINGS: The bones are diffusely osteopenic, which limits evaluation. No definite osteolysis or cortical destruction. No acute fracture or malalignment. Prominent hammertoe deformities of the second through fifth digits. Diffuse soft tissue swelling about the foot. IMPRESSION: No definite radiographic evidence of osteomyelitis. Diffuse soft tissue swelling about the foot heart HO. Electronically Signed   By: February 14, 2017 M.D.   On: 02/19/2017 17:03     ASSESSMENT AND PLAN:   * Left second toe gangrene with cellulitis. Sepsis present on admission.  IV Zosyn.  blood cultures No growth to date. Consulted podiatry for amputation of the toe.  * Chronic diastolic congestive heart failure. No signs of fluid overload.  * Diabetes mellitus. oral hypoglycemics. On sliding scale insulin.  * Dementia. Monitor for inpatient delirium.  * Paroxysmal atrial fibrillation. Started Cardizem. Hold metoprolol Hold Eliquis for surgery.  * DVT prophylaxis with Lovenox.  All the records are reviewed and case discussed with Care Management/Social Workerr. Management plans discussed with the patient, family and they are in agreement.  CODE STATUS: DNR  DVT Prophylaxis: SCDs  TOTAL CC TIME TAKING CARE OF THIS PATIENT:30 minutes.   POSSIBLE D/C IN 2-3 DAYS, DEPENDING ON CLINICAL CONDITION.  04/21/2017 R M.D on 02/21/2017 at 9:47 AM  Between 7am to 6pm - Pager - [phone]  After 6pm go  to [email or web address] - password [email or web address]  [facility] [place] [facility]  Office  [phone]  CC: Primary care physician; Angela Cox, MD  Note: This dictation was prepared with Dragon dictation along with smaller phrase technology. Any transcriptional errors that result from this process are unintentional.

## 2017-02-21 NOTE — Progress Notes (Signed)
Initial Nutrition Assessment  DOCUMENTATION CODES:   Non-severe (moderate) malnutrition in context of chronic illness  INTERVENTION:  Will downgrade diet to dysphagia 2 (fine chop) with thin liquids.   Provide Ensure Enlive po TID, each supplement provides 350 kcal and 20 grams of protein.   Provide multivitamin with minerals daily.  NUTRITION DIAGNOSIS:   Malnutrition (Moderate) related to chronic illness (Alzheimer's, Stg III pressure ulcer, COPD) as evidenced by 25 percent weight loss over the past year, moderate depletion of body fat, moderate depletions of muscle mass.  GOAL:   Patient will meet greater than or equal to 90% of their needs  MONITOR:   PO intake, Supplement acceptance, Labs, Weight trends, Skin, I & O's  REASON FOR ASSESSMENT:   Consult Assessment of nutrition requirement/status (and wound healing)  ASSESSMENT:   83 year old female with PMHx of Alzheimer's disease, DM type 2, CHF, urinary incontinence, cardiomyopathy, COPD, CAD, A-fib, HTN, HLD admitted with left second toe gangrene with cellulitis, sepsis.    -Per Podiatry note today amputation of left second toe is Administrator, arts.  Met with patient at bedside. Patient seen laying in bed on her side without gown or sheets. Appeared contracted. Offered to get patient a gown and cover her with sheets, but she refused. Patient confused and unable to answer any questions appropriately. No family members at bedside. Per chart patient has had poor PO intake here. Noted patient is having diarrhea here. Limited weight history in chart. It appears patient may have weighed 140-150 lbs in 2017. She has lost at least 35 lbs (25% body weight) over the past year, which is significant for time frame.  Meal Completion: 0-15%  Medications reviewed and include: Colace, Novolog 0-9 units TID, Remeron 7.5 mg daily, Zosyn.  Labs reviewed: CBG 71-118 past 24 hrs.  Nutrition-Focused physical exam completed. Findings are moderate  fat depletion (moderate depletion of upper arm region and thoracic/lumbar region), moderate muscle depletion (moderate depletion of clavicle bone region, clavicle/acromion bone region, scapular bone region, dorsal hand), and severe pitting edema to bilateral lower extremities. Poor dentition.  Diet Order:  Diet Carb Modified Fluid consistency: Thin; Room service appropriate? Yes  Skin:  Wound (see comment) (Stg III left hip, second left toe necrotic)  Last BM:  02/21/2017 - large type 7  Height:   Ht Readings from Last 1 Encounters:  02/19/17 _0  (1.6 m)    Weight:   Wt Readings from Last 1 Encounters:  02/21/17 105 lb 14.4 oz (48 kg)    Ideal Body Weight:  52.3 kg  BMI:  Body mass index is 18.76 kg/m.  Estimated Nutritional Needs:   Kcal:  1440-1680 (30-35 kcal/kg)  Protein:  67-77 grams (1.4-1.6 grams/kg)  Fluid:  1.2 L/day (25 ml/kg)  EDUCATION NEEDS:   Education needs no appropriate at this time  Willey Blade, [place], [place], [place] Office: [phone] Pager: [phone] After Hours/Weekend Pager: [phone]

## 2017-02-21 NOTE — Progress Notes (Signed)
[name] [name] ([id number]) Visit Report for 02/19/2017 Abuse/Suicide Risk Screen Details Patient Name: [name] [name], [name] [name] 02/19/2017 10:30 Date of Service: AM Medical Record [id number] Number: Patient Account Number: [id number] [birth year] (83 y.o. Treating RN: Phillis Haggis Date of Birth/Sex: Female) Other Clinician: Primary Care Declin Rajan: Karlene Einstein Treating Britto, Errol Referring Jaylei Fuerte: Daryel November Symone Cornman/Extender: Weeks in Treatment: 0 Abuse/Suicide Risk Screen Items Answer Notes pt unable to answer pt is a Alzheimer's pt Electronic Signature(s) Signed: 02/19/2017 4:03:52 PM By: Alejandro Mulling Entered By: Alejandro Mulling on 02/19/2017 10:32:43 Goble, [name] ([id number]) -------------------------------------------------------------------------------- Activities of Daily Living Details Patient Name: [name] [name] 02/19/2017 10:30 Date of Service: AM Medical Record [id number] Number: Patient Account Number: [id number] [birth year] (83 y.o. Treating RN: Phillis Haggis Date of Birth/Sex: Female) Other Clinician: Primary Care Latarsha Zani: Gloriajean Dell, Errol Referring Chelcy Bolda: Daryel November Prakash Kimberling/Extender: Weeks in Treatment: 0 Activities of Daily Living Items Answer Activities of Daily Living (Please select one for each item) Drive Automobile Not Able Take Medications Not Able Use Telephone Not Able Care for Appearance Not Able Use Toilet Not Able Mady Haagensen / Shower Not Able Dress Self Not Able Feed Self Not Able Walk Not Able Get In / Out Bed Not Able Housework Not Able Prepare Meals Not Able Handle Money Not Able Shop for Self Not Able Electronic Signature(s) Signed: 02/19/2017 4:03:52 PM By: Alejandro Mulling Entered By: Alejandro Mulling on 02/19/2017 10:33:08 [name] [name] ([id number]) -------------------------------------------------------------------------------- Education Assessment Details Patient Name:  [name] [name]. 02/19/2017 10:30 Date of Service: AM Medical Record [id number] Number: Patient Account Number: [id number] 1929/09/18 (83 y.o. Treating RN: Phillis Haggis Date of Birth/Sex: Female) Other Clinician: Primary Care Moris Ratchford: Gloriajean Dell, Errol Referring Jenay Morici: Daryel November Marti Mclane/Extender: Tania Ade in Treatment: 0 Primary Learner Assessed: Caregiver Reason Patient is not Primary Learner: pt is a Alzheimer's pt Learning Preferences/Education Level/Primary Language Learning Preference: Explanation Highest Education Level: College or Above Preferred Language: English Cognitive Barrier Assessment/Beliefs Language Barrier: No Translator Needed: No Memory Deficit: No Emotional Barrier: No Cultural/Religious Beliefs Affecting Medical No Care: Physical Barrier Assessment Impaired Vision: No Impaired Hearing: No Decreased Hand dexterity: No Knowledge/Comprehension Assessment Knowledge Level: High Comprehension Level: High Ability to understand written High instructions: Ability to understand verbal High instructions: Motivation Assessment Anxiety Level: Calm Cooperation: Cooperative Education Importance: Acknowledges Need Interest in Health Problems: Asks Questions Perception: Coherent Willingness to Engage in Self- High Management Activities: High JAMIRACLE, AVANTS ([id number]) Readiness to Engage in Self- Management Activities: Electronic Signature(s) Signed: 02/19/2017 4:03:52 PM By: Alejandro Mulling Entered By: Alejandro Mulling on 02/19/2017 10:33:57 [name] [name] ([id number]) -------------------------------------------------------------------------------- Fall Risk Assessment Details Patient Name: [name] [name]. 02/19/2017 10:30 Date of Service: AM Medical Record [id number] Number: Patient Account Number: [id number] 06/17/1929 (83 y.o. Treating RN: Phillis Haggis Date of Birth/Sex: Female) Other Clinician: Primary  Care Tynell Winchell: Gloriajean Dell, Errol Referring Governor Matos: Daryel November Tierre Netto/Extender: Weeks in Treatment: 0 Fall Risk Assessment Items Have you had 2 or more falls in the last 12 monthso 0 No Have you had any fall that resulted in injury in the last 12 monthso 0 No FALL RISK ASSESSMENT: History of falling - immediate or within 3 months 0 No Secondary diagnosis 0 No Ambulatory aid None/bed rest/wheelchair/nurse 0 Yes Crutches/cane/walker 0 No Furniture 0 No IV Access/Saline Lock 0 No Gait/Training Normal/bed rest/immobile 0 Yes Weak 10 Yes Impaired 20 Yes Mental Status Oriented to own ability 0 No Electronic Signature(s) Signed: 02/19/2017 4:03:52 PM By: Alejandro Mulling Entered By: Ashok Cordia,  Debra on 02/19/2017 10:34:16 Fussner, [name] ([id number]) -------------------------------------------------------------------------------- Foot Assessment Details Patient Name: [name] [name] 02/19/2017 10:30 Date of Service: AM Medical Record [id number] Number: Patient Account Number: [id number] Mar 01, 1930 (83 y.o. Treating RN: Phillis Haggis Date of Birth/Sex: Female) Other Clinician: Primary Care Yee Gangi: Gloriajean Dell, Errol Referring Michaelanthony Kempton: Daryel November Khyra Viscuso/Extender: Weeks in Treatment: 0 Foot Assessment Items Site Locations + = Sensation present, - = Sensation absent, C = Callus, U = Ulcer R = Redness, W = Warmth, M = Maceration, PU = Pre-ulcerative lesion F = Fissure, S = Swelling, D = Dryness Assessment Right: Left: Other Deformity: No No Prior Foot Ulcer: No No Prior Amputation: No No Charcot Joint: No No Ambulatory Status: Gait: Notes unable to add pt is a Alzheimer's pt and cant answer Electronic Signature(s) Signed: 02/19/2017 4:03:52 PM By: Avon Gully ([id number]) Entered By: Alejandro Mulling on 02/19/2017 10:35:07 [name] [name]  ([id number]) -------------------------------------------------------------------------------- Nutrition Risk Assessment Details Patient Name: [name] [name]. 02/19/2017 10:30 Date of Service: AM Medical Record [id number] Number: Patient Account Number: [id number] 12-29-1929 (83 y.o. Treating RN: Phillis Haggis Date of Birth/Sex: Female) Other Clinician: Primary Care Eddye Broxterman: Gloriajean Dell, Errol Referring Marcelo Ickes: Daryel November Roshard Rezabek/Extender: Weeks in Treatment: 0 Height (in): 63 Weight (lbs): 122 Body Mass Index (BMI): 21.6 Nutrition Risk Assessment Items NUTRITION RISK SCREEN: I have an illness or condition that made me change the kind and/or 2 Yes amount of food I eat I eat fewer than two meals per day 0 No I eat few fruits and vegetables, or milk products 0 No I have three or more drinks of beer, liquor or wine almost every day 0 No I have tooth or mouth problems that make it hard for me to eat 0 No I don't always have enough money to buy the food I need 0 No I eat alone most of the time 0 No I take three or more different prescribed or over-the-counter drugs a 1 Yes day Without wanting to, I have lost or gained 10 pounds in the last six 0 No months I am not always physically able to shop, cook and/or feed myself 0 No Nutrition Protocols Good Risk Protocol Moderate Risk Protocol Electronic Signature(s) Signed: 02/19/2017 4:03:52 PM By: Alejandro Mulling Entered By: Alejandro Mulling on 02/19/2017 10:34:44

## 2017-02-21 NOTE — Progress Notes (Signed)
PLACIDA, CAMBRE ([id number]) Visit Report for 02/19/2017 Allergy List Details Patient Name: [name] [name], [name] [name]. Date of Service: 02/19/2017 10:30 AM Medical Record Number: [id number] Patient Account Number: [id number] Date of Birth/Sex: 01-Jun-1929 (83 y.o. Female) Treating RN: Ashok Cordia, Debi Primary Care Mckynlee Luse: Karlene Einstein Other Clinician: Referring Jerianne Anselmo: Daryel November Treating Kimball Manske/Extender: Rudene Re in Treatment: 0 Allergies Active Allergies NKDA Allergy Notes Electronic Signature(s) Signed: 02/19/2017 4:03:52 PM By: Alejandro Mulling Entered By: Alejandro Mulling on 02/19/2017 10:24:32 Bonifield, [name] ([id number]) -------------------------------------------------------------------------------- Arrival Information Details Patient Name: [name]. Date of Service: 02/19/2017 10:30 AM Medical Record Number: [id number] Patient Account Number: [id number] Date of Birth/Sex: 06-Sep-1929 (83 y.o. Female) Treating RN: Ashok Cordia, Debi Primary Care Joseeduardo Brix: Karlene Einstein Other Clinician: Referring Sejal Cofield: Daryel November Treating Toney Difatta/Extender: Rudene Re in Treatment: 0 Visit Information Patient Arrived: Wheel Chair Arrival Time: 10:14 Accompanied By: caregiver Transfer Assistance: Manual Patient Identification Verified: Yes Secondary Verification Process Yes Completed: Patient Requires Transmission- No Based Precautions: Patient Has Alerts: Yes Patient Alerts: Patient on Blood Thinner DM II Eliquis Electronic Signature(s) Signed: 02/19/2017 4:03:52 PM By: Alejandro Mulling Entered By: Alejandro Mulling on 02/19/2017 10:22:39 Lo, [name] ([id number]) -------------------------------------------------------------------------------- Clinic Level of Care Assessment Details Patient Name: [name]. Date of Service: 02/19/2017 10:30 AM Medical Record Number: [id number] Patient Account Number: [id number] Date of Birth/Sex:  [birth year] (83 y.o. Female) Treating RN: Ashok Cordia, Debi Primary Care Karrie Fluellen: Karlene Einstein Other Clinician: Referring Yash Cacciola: Daryel November Treating Enslee Bibbins/Extender: Rudene Re in Treatment: 0 Clinic Level of Care Assessment Items TOOL 2 Quantity Score X - Use when only an EandM is performed on the INITIAL visit 1 0 ASSESSMENTS - Nursing Assessment / Reassessment X - General Physical Exam (combine w/ comprehensive assessment (listed just 1 20 below) when performed on new pt. evals) X - Comprehensive Assessment (HX, ROS, Risk Assessments, Wounds Hx, etc.) 1 25 ASSESSMENTS - Wound and Skin Assessment / Reassessment X - Simple Wound Assessment / Reassessment - one wound 1 5 []  - Complex Wound Assessment / Reassessment - multiple wounds 0 []  - Dermatologic / Skin Assessment (not related to wound area) 0 ASSESSMENTS - Ostomy and/or Continence Assessment and Care []  - Incontinence Assessment and Management 0 []  - Ostomy Care Assessment and Management (repouching, etc.) 0 PROCESS - Coordination of Care []  - Simple Patient / Family Education for ongoing care 0 X - Complex (extensive) Patient / Family Education for ongoing care 1 20 X - Staff obtains , Records, Test Results / Process Orders 1 10 X - Staff telephones HHA, Nursing Homes / Clarify orders / etc 1 10 []  - Routine Transfer to another Facility (non-emergent condition) 0 X - Routine Hospital Admission (non-emergent condition) 1 10 []  - New Admissions / / Ordering NPWT, Apligraf, etc. 0 []  - Emergency Hospital Admission (emergent condition) 0 X - Simple Discharge Coordination 1 10 Debellis, Zuha M. ( ) []  - Complex (extensive) Discharge Coordination 0 PROCESS - Special Needs []  - Pediatric / Minor Patient Management 0 []  - Isolation Patient Management 0 []  - Hearing / Language / Visual special needs 0 []  - Assessment of Community assistance (transportation, D/C  planning, etc.) 0 []  - Additional assistance / Altered mentation 0 []  - Support Surface(s) Assessment (bed, cushion, seat, etc.) 0 INTERVENTIONS - Wound Cleansing / Measurement X - Wound Imaging (photographs - any number of wounds) 1 5 []  - Wound Tracing (instead of photographs) 0 X - Simple Wound Measurement - one wound 1 5 []  -  Complex Wound Measurement - multiple wounds 0 X - Simple Wound Cleansing - one wound 1 5 []  - Complex Wound Cleansing - multiple wounds 0 INTERVENTIONS - Wound Dressings X - Small Wound Dressing one or multiple wounds 1 10 []  - Medium Wound Dressing one or multiple wounds 0 []  - Large Wound Dressing one or multiple wounds 0 []  - Application of Medications - injection 0 INTERVENTIONS - Miscellaneous []  - External ear exam 0 []  - Specimen Collection (cultures, biopsies, blood, body fluids, etc.) 0 []  - Specimen(s) / Culture(s) sent or taken to Lab for analysis 0 X - Patient Transfer (multiple staff / Nurse, adult / Similar devices) 1 10 []  - Simple Staple / Suture removal (25 or less) 0 []  - Complex Staple / Suture removal (26 or more) 0 Fedak, Norita M. ([id number]) []  - Hypo / Hyperglycemic Management (close monitor of Blood Glucose) 0 []  - Ankle / Brachial Index (ABI) - do not check if billed separately 0 Has the patient been seen at the hospital within the last three years: Yes Total Score: 145 Level Of Care: New/Established - Level 4 Electronic Signature(s) Signed: 02/19/2017 4:03:52 PM By: Alejandro Mulling Entered By: Alejandro Mulling on 02/19/2017 11:15:09 [place], [name] ([id number]) -------------------------------------------------------------------------------- Encounter Discharge Information Details Patient Name: [name]. Date of Service: 02/19/2017 10:30 AM Medical Record Number: [id number] Patient Account Number: [id number] Date of Birth/Sex: [birth year] (83 y.o. Female) Treating RN: Ashok Cordia, Debi Primary Care Cameshia Cressman: Karlene Einstein Other Clinician: Referring Detrick Dani: Daryel November Treating Sheila Ocasio/Extender: Rudene Re in Treatment: 0 Encounter Discharge Information Items Discharge Condition: Stable Ambulatory Status: Wheelchair Emergency Discharge Destination: Room Transportation: Other Accompanied By: caregiver Schedule Follow-up Appointment: No Medication Reconciliation completed and provided to Patient/Care No Masiyah Engen: Provided on Clinical Summary of Care: 02/19/2017 Form Type Recipient Paper Patient LG Electronic Signature(s) Signed: 02/19/2017 4:20:56 PM By: Gwenlyn Perking Entered By: Gwenlyn Perking on 02/19/2017 11:09:10 [place], [name] ([id number]) -------------------------------------------------------------------------------- Lower Extremity Assessment Details Patient Name: [name]. Date of Service: 02/19/2017 10:30 AM Medical Record Number: [id number] Patient Account Number: [id number] Date of Birth/Sex: 1929/06/11 (83 y.o. Female) Treating RN: Ashok Cordia, Debi Primary Care Aashi Derrington: Karlene Einstein Other Clinician: Referring Gailyn Crook: Daryel November Treating Calhoun Reichardt/Extender: Rudene Re in Treatment: 0 Vascular Assessment Pulses: Dorsalis Pedis Palpable: [Left:No] Doppler Audible: [Left:Yes] Posterior Tibial Palpable: [Left:No] Extremity colors, hair growth, and conditions: Extremity Color: [Left:Normal] Hair Growth on Extremity: [Left:No] Temperature of Extremity: [Left:Cool] Capillary Refill: [Left:< 3 seconds] Toe Nail Assessment Left: Right: Thick: Yes Discolored: Yes Deformed: Yes Improper Length and Hygiene: Yes Notes unable to get ABI pt moving foot aggressively pts legs are contracted Electronic Signature(s) Signed: 02/19/2017 4:03:52 PM By: Alejandro Mulling Entered By: Alejandro Mulling on 02/19/2017 10:42:49 Matranga, [name] ([id number]) -------------------------------------------------------------------------------- Multi  Wound Chart Details Patient Name: [name]. Date of Service: 02/19/2017 10:30 AM Medical Record Number: [id number] Patient Account Number: [id number] Date of Birth/Sex: May 02, 1930 (83 y.o. Female) Treating RN: Ashok Cordia, Debi Primary Care Andersson Larrabee: Karlene Einstein Other Clinician: Referring Ianmichael Amescua: Daryel November Treating Wynne Jury/Extender: Rudene Re in Treatment: 0 Vital Signs Height(in): 63 Pulse(bpm): 100 Weight(lbs): 122 Blood Pressure 93/54 (mmHg): Body Mass Index(BMI): 22 Temperature(F): 97.9 Respiratory Rate 16 (breaths/min): Photos: [1:No Photos] [N/A:N/A] Wound Location: [1:Left Toe Second] [N/A:N/A] Wounding Event: [1:Gradually Appeared] [N/A:N/A] Primary Etiology: [1:Diabetic Wound/Ulcer of the Lower Extremity] [N/A:N/A] Comorbid History: [1:Chronic Obstructive Pulmonary Disease (COPD), Arrhythmia, Congestive Heart Failure, Coronary Artery Disease, Hypertension, Type II Diabetes, Osteoarthritis, Dementia] [N/A:N/A] Date Acquired: [1:01/09/2017] [N/A:N/A] Weeks  of Treatment: [1:0] [N/A:N/A] Wound Status: [1:Open] [N/A:N/A] Measurements L x W x D 4x5x0.2 [N/A:N/A] (cm) Area (cm) : [1:15.708] [N/A:N/A] Volume (cm) : [1:3.142] [N/A:N/A] Classification: [1:Grade 1] [N/A:N/A] Exudate Amount: [1:Large] [N/A:N/A] Exudate Type: [1:Serous] [N/A:N/A] Exudate Color: [1:amber] [N/A:N/A] Foul Odor After [1:Yes] [N/A:N/A] Cleansing: Odor Anticipated Due to No [N/A:N/A] Product Use: Wound Margin: [1:Distinct, outline attached] [N/A:N/A] Granulation Amount: [1:None Present (0%)] [N/A:N/A] Necrotic Amount: Large (67-100%) N/A N/A Necrotic Tissue: Eschar, Adherent Slough N/A N/A Epithelialization: None N/A N/A Periwound Skin Texture: No Abnormalities Noted N/A N/A Periwound Skin No Abnormalities Noted N/A N/A Moisture: Periwound Skin Color: No Abnormalities Noted N/A N/A Temperature: No Abnormality N/A N/A Tenderness on Yes N/A  N/A Palpation: Wound Preparation: Ulcer Cleansing: N/A N/A Rinsed/Irrigated with Saline Topical Anesthetic Applied: Other: lidocaine 4% Treatment Notes Wound #1 (Left Toe Second) 1. Cleansed with: Clean wound with Normal Saline 2. Anesthetic Topical Lidocaine 4% cream to wound bed prior to debridement 5. Secondary Dressing Applied Dry Gauze Kerlix/Conform 7. Secured with Secretary/administrator) Signed: 02/19/2017 4:03:52 PM By: Alejandro Mulling Previous Signature: 02/19/2017 11:09:19 AM Version By: Evlyn Kanner MD, FACS Entered By: Alejandro Mulling on 02/19/2017 11:14:05 [name] ([id number]) -------------------------------------------------------------------------------- Multi-Disciplinary Care Plan Details Patient Name: [name]. Date of Service: 02/19/2017 10:30 AM Medical Record Number: [id number] Patient Account Number: [id number] Date of Birth/Sex: March 02, 1930 (83 y.o. Female) Treating RN: Ashok Cordia, Debi Primary Care Eliza : Karlene Einstein Other Clinician: Referring Artice Holohan: Daryel November Treating Salathiel Ferrara/Extender: Rudene Re in Treatment: 0 Active Inactive Electronic Signature(s) Signed: 02/19/2017 4:03:52 PM By: Alejandro Mulling Entered By: Alejandro Mulling on 02/19/2017 11:13:46 [place], [name] ([id number]) -------------------------------------------------------------------------------- Pain Assessment Details Patient Name: [name]. Date of Service: 02/19/2017 10:30 AM Medical Record Number: [id number] Patient Account Number: [id number] Date of Birth/Sex: 1929-12-22 (83 y.o. Female) Treating RN: Ashok Cordia, Debi Primary Care Annissa Andreoni: Karlene Einstein Other Clinician: Referring Shayanna Thatch: Daryel November Treating Chazlyn Cude/Extender: Rudene Re in Treatment: 0 Active Problems Location of Pain Severity and Description of Pain Patient Has Paino Patient Unable to Respond Site Locations Pain Management and  Medication Current Pain Management: Electronic Signature(s) Signed: 02/19/2017 4:03:52 PM By: Alejandro Mulling Entered By: Alejandro Mulling on 02/19/2017 10:22:45 [place], [name] ([id number]) -------------------------------------------------------------------------------- Patient/Caregiver Education Details Patient Name: [name]. Date of Service: 02/19/2017 10:30 AM Medical Record Number: [id number] Patient Account Number: [id number] Date of Birth/Gender: 07-28-1929 (83 y.o. Female) Treating RN: Ashok Cordia, Debi Primary Care Physician: Karlene Einstein Other Clinician: Referring Physician: Daryel November Treating Physician/Extender: Rudene Re in Treatment: 0 Education Assessment Education Provided To: Patient Education Topics Provided Wound/Skin Impairment: Handouts: Other: Go straight to the ER. Methods: Explain/Verbal Responses: State content correctly Electronic Signature(s) Signed: 02/19/2017 4:03:52 PM By: Alejandro Mulling Entered By: Alejandro Mulling on 02/19/2017 11:07:40 Pe, [name] ([id number]) -------------------------------------------------------------------------------- Wound Assessment Details Patient Name: [name]. Date of Service: 02/19/2017 10:30 AM Medical Record Number: [id number] Patient Account Number: [id number] Date of Birth/Sex: [birth year] (82 y.o. Female) Treating RN: Ashok Cordia, Debi Primary Care Kathlean Cinco: Karlene Einstein Other Clinician: Referring Tina Gruner: Daryel November Treating Zadaya Cuadra/Extender: Rudene Re in Treatment: 0 Wound Status Wound Number: 1 Primary Diabetic Wound/Ulcer of the Lower Etiology: Extremity Wound Location: Left Toe Second Wound Open Wounding Event: Gradually Appeared Status: Date Acquired: 01/09/2017 Comorbid Chronic Obstructive Pulmonary Disease Weeks Of Treatment: 0 History: (COPD), Arrhythmia, Congestive Heart Clustered Wound: No Failure, Coronary Artery  Disease, Hypertension, Type II Diabetes, Osteoarthritis, Dementia Photos Photo Uploaded By: Alejandro Mulling on 02/19/2017 11:17:11 Wound Measurements Length: (  cm) 4 Width: (cm) 5 Depth: (cm) 0.2 Area: (cm) 15.708 Volume: (cm) 3.142 % Reduction in Area: 0% % Reduction in Volume: 0% Epithelialization: None Tunneling: No Undermining: No Wound Description Classification: Grade 1 Foul Odor Aft Wound Margin: Distinct, outline attached Due to Produc Exudate Amount: Large Slough/Fibrin Exudate Type: Serous Exudate Color: amber er Cleansing: Yes t Use: No o Yes Wound Bed Granulation Amount: None Present (0%) Necrotic Amount: Large (67-100%) [place]rop, Wilder M. ([id number]) Necrotic Quality: Eschar, Adherent Slough Periwound Skin Texture Texture Color No Abnormalities Noted: No No Abnormalities Noted: No Moisture Temperature / Pain No Abnormalities Noted: No Temperature: No Abnormality Dry / Scaly: Yes Tenderness on Palpation: Yes Maceration: Yes Wound Preparation Ulcer Cleansing: Rinsed/Irrigated with Saline Topical Anesthetic Applied: Other: lidocaine 4%, Treatment Notes Wound #1 (Left Toe Second) 1. Cleansed with: Clean wound with Normal Saline 2. Anesthetic Topical Lidocaine 4% cream to wound bed prior to debridement 5. Secondary Dressing Applied Dry Gauze Kerlix/Conform 7. Secured with Secretary/administrator) Signed: 02/19/2017 4:03:52 PM By: Alejandro Mulling Entered By: Alejandro Mulling on 02/19/2017 11:16:15 Daywalt, [name] ([id number]) -------------------------------------------------------------------------------- Vitals Details Patient Name: [name]. Date of Service: 02/19/2017 10:30 AM Medical Record Number: [id number] Patient Account Number: [id number] Date of Birth/Sex: [birth year] (83 y.o. Female) Treating RN: Ashok Cordia, Debi Primary Care Natash Berman: Karlene Einstein Other Clinician: Referring Maelynn Moroney: Daryel November Treating  Sharlon Pfohl/Extender: Rudene Re in Treatment: 0 Vital Signs Time Taken: 10:22 Temperature (F): 97.9 Height (in): 63 Pulse (bpm): 100 Source: Stated Respiratory Rate (breaths/min): 16 Weight (lbs): 122 Blood Pressure (mmHg): 93/54 Source: Stated Reference Range: 80 - 120 mg / dl Body Mass Index (BMI): 21.6 Electronic Signature(s) Signed: 02/19/2017 4:03:52 PM By: Alejandro Mulling Entered By: Alejandro Mulling on 02/19/2017 10:24:00

## 2017-02-21 NOTE — Progress Notes (Signed)
PHIL, CORTI ([id number]) Visit Report for 02/19/2017 Chief Complaint Document Details Patient Name: [name], [name] 02/19/2017 10:30 Date of Service: AM Medical Record [id number] Number: Patient Account Number: [id number] [birth year] (83 y.o. Treating RN: Phillis Haggis Date of Birth/Sex: Female) Other Clinician: Primary Care Provider: Gloriajean Dell, Arayna Illescas Referring Provider: Daryel November Provider/Extender: Weeks in Treatment: 0 Information Obtained from: Patient Chief Complaint Patients presents for treatment of an open diabetic ulcer with gangrene of the left second toe for the last 6 weeks Electronic Signature(s) Signed: 02/19/2017 11:09:44 AM By: Evlyn Kanner MD, FACS Entered By: Evlyn Kanner on 02/19/2017 11:09:44 [name] [name] ([id number]) -------------------------------------------------------------------------------- HPI Details Patient Name: [name] [name]. 02/19/2017 10:30 Date of Service: AM Medical Record [id number] Number: Patient Account Number: [id number] 1929-05-22 (83 y.o. Treating RN: Phillis Haggis Date of Birth/Sex: Female) Other Clinician: Primary Care Provider: Karlene Einstein Treating Natika Geyer Referring Provider: Daryel November Provider/Extender: Weeks in Treatment: 0 History of Present Illness Location: left second toe Quality: Patient reports experiencing a dull pain to affected area(s). Severity: Patient states wound are getting worse. Duration: Patient has had the wound for < 6 weeks prior to presenting for treatment Timing: Pain in wound is Intermittent (comes and goes Context: The wound would happen gradually Modifying Factors: Other treatment(s) tried include:was seen in the ER last week and put on local care and antibiotics Associated Signs and Symptoms: Patient reports having increase discharge. HPI Description: 83 year old patient who has significant dementia had been seen recently in the ED for  an infected toe and this was the left second toe on the plantar surface.past medical history significant for atrial fibrillation, Alzheimer's disease, carotid artery disease, cardiomyopathy, CHF, dementia, diabetes mellitus, hypertension, status post hysterectomy and pacemaker placement. x-ray done in the ER showed diffuse osteopenia with no definite evidence of osteomyelitis. the patient had strong dorsalis pedis pulse on Doppler, she was placed on Keflex and Bactroban ointment and was asked to see the wound center Electronic Signature(s) Signed: 02/19/2017 11:10:37 AM By: Evlyn Kanner MD, FACS Previous Signature: 02/19/2017 10:40:42 AM Version By: Evlyn Kanner MD, FACS Previous Signature: 02/19/2017 10:37:43 AM Version By: Evlyn Kanner MD, FACS Previous Signature: 02/19/2017 10:36:35 AM Version By: Evlyn Kanner MD, FACS Previous Signature: 02/19/2017 10:36:02 AM Version By: Evlyn Kanner MD, FACS Entered By: Evlyn Kanner on 02/19/2017 11:10:37 [name] [name] ([id number]) -------------------------------------------------------------------------------- Physical Exam Details Patient Name: [name], [name] 02/19/2017 10:30 Date of Service: AM Medical Record [id number] Number: Patient Account Number: [id number] 19-Apr-1930 (83 y.o. Treating RN: Phillis Haggis Date of Birth/Sex: Female) Other Clinician: Primary Care Provider: Karlene Einstein Treating Rhyen Mazariego Referring Provider: Daryel November Provider/Extender: Weeks in Treatment: 0 Constitutional . Pulse regular. Respirations normal and unlabored. Afebrile. . Eyes Nonicteric. Reactive to light. Ears, Nose, Mouth, and Throat Lips, teeth, and gums WNL.Marland Kitchen Moist mucosa without lesions. Neck supple and nontender. No palpable supraclavicular or cervical adenopathy. Normal sized without goiter. Respiratory WNL. No retractions.. Cardiovascular Pedal Pulses WNL. ABI could not be measured. No clubbing, cyanosis or  edema. Chest Breasts symmetical and no nipple discharge.. Breast tissue WNL, no masses, lumps, or tenderness.. Gastrointestinal (GI) Abdomen without masses or tenderness.. No liver or spleen enlargement or tenderness.. Lymphatic No adneopathy. No adenopathy. No adenopathy. Musculoskeletal Adexa without tenderness or enlargement.. Digits and nails w/o clubbing, cyanosis, infection, petechiae, ischemia, or inflammatory conditions.. Integumentary (Hair, Skin) No suspicious lesions. No crepitus or fluctuance. No peri-wound warmth or erythema. No masses.Marland Kitchen Psychiatric Judgement and insight Intact.. No evidence of depression, anxiety,  or agitation.. Notes left foot shows edema and localized cellulitis in the forefoot and the left second toe has wet gangrene with significant malodor and purulent discharge. Electronic Signature(s) Signed: 02/19/2017 11:11:36 AM By: Evlyn Kanner MD, FACS Entered By: Evlyn Kanner on 02/19/2017 11:11:35 [name] [name], [name] [name] ([id number]) [name], [name] ([id number]) -------------------------------------------------------------------------------- Physician Orders Details Patient Name: [name] [name]. 02/19/2017 10:30 Date of Service: AM Medical Record [id number] Number: Patient Account Number: [id number] 1930-01-26 (83 y.o. Treating RN: Phillis Haggis Date of Birth/Sex: Female) Other Clinician: Primary Care Provider: Gloriajean Dell, Chayim Bialas Referring Provider: Daryel November Provider/Extender: Tania Ade in Treatment: 0 Verbal / Phone Orders: Yes ClinicianAshok Cordia, Debi Read Back and Verified: Yes Diagnosis Coding Wound Cleansing Wound #1 Left Toe Second o Clean wound with Normal Saline. Anesthetic Wound #1 Left Toe Second o Topical Lidocaine 4% cream applied to wound bed prior to debridement Secondary Dressing Wound #1 Left Toe Second o Dry Gauze o Conform/Kerlix Additional Orders / Instructions Wound #1 Left Toe Second o  Other: - Go straight to ER at [facility]. Electronic Signature(s) Signed: 02/19/2017 4:03:52 PM By: Alejandro Mulling Signed: 02/19/2017 4:18:43 PM By: Evlyn Kanner MD, FACS Entered By: Alejandro Mulling on 02/19/2017 11:04:15 [name] [name], [name] [name] ([id number]) -------------------------------------------------------------------------------- Problem List Details Patient Name: ZEPPELIN, BECKSTRAND 02/19/2017 10:30 Date of Service: AM Medical Record [id number] Number: Patient Account Number: [id number] 22-Nov-1929 (83 y.o. 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 02/19/2017 10:30 Date of Service: AM Medical Record [id number] Number: Patient Account Number: [id number] 1930-01-22 (83 y.o. Treating RN: Phillis Haggis Date of Birth/Sex: Female) Other Clinician: Primary Care Provider: Gloriajean Dell, Thanh Pomerleau Referring Provider: Daryel November Provider/Extender: Weeks in Treatment: 0 Subjective Chief Complaint Information obtained from Patient Patients presents for treatment of an open diabetic ulcer with gangrene of the left second toe for the last 6 weeks History  of Present Illness (HPI) The following HPI elements were documented for the patient's wound: Location: left second toe Quality: Patient reports experiencing a dull pain to affected area(s). Severity: Patient states wound are getting worse. Duration: Patient has had the wound for < 6 weeks prior to presenting for treatment Timing: Pain in wound is Intermittent (comes and goes Context: The wound would happen gradually Modifying Factors: Other treatment(s) tried include:was seen in the ER last week and put on local care and antibiotics Associated Signs and Symptoms: Patient reports having increase discharge. 83 year old patient who has significant dementia had been seen recently in the ED for an infected toe and this was the left second toe on the plantar surface.past medical history significant for atrial fibrillation, Alzheimer's disease, carotid artery disease, cardiomyopathy, CHF, dementia, diabetes mellitus, hypertension, status post hysterectomy and pacemaker placement. x-ray done in the ER showed diffuse osteopenia with no definite evidence of osteomyelitis. the patient had strong dorsalis pedis pulse on Doppler, she was placed on Keflex and Bactroban ointment and was asked to see the  wound center Wound History Patient presents with 1 open wound that has been present for approximately 9/1. Patient has been treating wound in the following manner: bactroban, keflex. Laboratory tests have not been performed in the last month. Patient reportedly has not tested positive for an antibiotic resistant organism. Patient reportedly has not tested positive for osteomyelitis. Patient reportedly has not had testing performed to evaluate circulation in the legs. Patient experiences the following problems associated with their wounds: swelling. Patient History Unable to Obtain Patient History due to Dementia. Information obtained from Caregiver. [name] [name], [name] [name] ([id number]) Allergies NKDA Social  History Unknown if ever smoked, Marital Status - Widowed, Alcohol Use - Never, Drug Use - No History, Caffeine Use - Daily. Medical History Respiratory Patient has history of Chronic Obstructive Pulmonary Disease (COPD) Cardiovascular Patient has history of Arrhythmia - a-fib, sinus tach, Congestive Heart Failure, Coronary Artery Disease, Hypertension Endocrine Patient has history of Type II Diabetes Musculoskeletal Patient has history of Osteoarthritis Neurologic Patient has history of Dementia Patient is treated with Oral Agents. Blood sugar is tested. Review of Systems (ROS) Constitutional Symptoms (General Health) The patient has no complaints or symptoms. Eyes The patient has no complaints or symptoms. Ear/Nose/Mouth/Throat The patient has no complaints or symptoms. Hematologic/Lymphatic The patient has no complaints or symptoms. Cardiovascular hx cardiomyopathy hyperlipidemia Gastrointestinal diverticulosis Endocrine The patient has no complaints or symptoms. Genitourinary Complains or has symptoms of Incontinence/dribbling. Immunological eczema Integumentary (Skin) The patient has no complaints or symptoms. Oncologic The patient has no complaints or symptoms. Psychiatric The patient has no complaints or symptoms. General Notes: unable to add family history pt is a Alzheimer's pt [name] [name], [name] [name]. ([id number]) Medications acetaminophen 500 mg tablet oral 2 2 tablets oral (1000 mg.) three times daily fexofenadine 180 mg tablet oral 1 1 tablet oral daily albuterol sulfate HFA 90 mcg/actuation aerosol inhaler inhalation 2 2 HFA aerosol inhaler inhalations every six hours as needed amlodipine 5 mg tablet oral 1 1 tablet oral daily diltiazem 60 mg tablet oral 1 1 tablet oral two times daily cephalexin 250 mg capsule oral 1 1 capsule oral four times daily Eliquis 5 mg tablet oral 1 1 tablet oral two times daily fluticasone 50 mcg/actuation nasal spray,suspension nasal  1 1 spray,suspension nasal into both nostrils daily mupirocin 2 % topical ointment topical ointment topical apply to affected area three times daily diclofenac 1 % topical gel topical gel topical apply two grams two times daily Objective Constitutional Pulse regular. Respirations normal and unlabored. Afebrile. Vitals Time Taken: 10:22 AM, Height: 63 in, Source: Stated, Weight: 122 lbs, Source: Stated, BMI: 21.6, Temperature: 97.9 F, Pulse: 100 bpm, Respiratory Rate: 16 breaths/min, Blood Pressure: 93/54 mmHg. Eyes Nonicteric. Reactive to light. Ears, Nose, Mouth, and Throat Lips, teeth, and gums WNL.Marland Kitchen Moist mucosa without lesions. Neck supple and nontender. No palpable supraclavicular or cervical adenopathy. Normal sized without goiter. Respiratory WNL. No retractions.. Cardiovascular Pedal Pulses WNL. ABI could not be measured. No clubbing, cyanosis or edema. Chest Breasts symmetical and no nipple discharge.. Breast tissue WNL, no masses, lumps, or tenderness.Marland Kitchen [name] [name] ([id number]) Gastrointestinal (GI) Abdomen without masses or tenderness.. No liver or spleen enlargement or tenderness.. Lymphatic No adneopathy. No adenopathy. No adenopathy. Musculoskeletal Adexa without tenderness or enlargement.. Digits and nails w/o clubbing, cyanosis, infection, petechiae, ischemia, or inflammatory conditions.Marland Kitchen Psychiatric Judgement and insight Intact.. No evidence of depression, anxiety, or agitation.. General Notes: left foot shows edema and localized cellulitis in the forefoot and the left second toe has wet  gangrene with significant malodor and purulent discharge. Integumentary (Hair, Skin) No suspicious lesions. No crepitus or fluctuance. No peri-wound warmth or erythema. No masses.. Wound #1 status is Open. Original cause of wound was Gradually Appeared. The wound is located on the Left Toe Second. The wound measures 4cm length x 5cm width x 0.2cm depth; 15.708cm^2 area  and 3.142cm^3 volume. There is no tunneling or undermining noted. There is a large amount of serous drainage noted. Foul odor after cleansing was noted. The wound margin is distinct with the outline attached to the wound base. There is no granulation within the wound bed. There is a large (67-100%) amount of necrotic tissue within the wound bed including Eschar and Adherent Slough. The periwound skin appearance exhibited: Dry/Scaly, Maceration. Periwound temperature was noted as No Abnormality. The periwound has tenderness on palpation. Assessment Active Problems ICD-10 E11.52 - Type 2 diabetes mellitus with diabetic peripheral angiopathy with gangrene L97.523 - Non-pressure chronic ulcer of other part of left foot with necrosis of muscle L03.032 - Cellulitis of left toe this 83 year old patient who is a diabetic and has initial signs of sepsis has come to the wound care clinic with a frankly gangrenous to on the left second, with signs of localized cellulitis and early sepsis. [name] [name], [name] [name] ([id number]) I have spoken to Dr. Shaune Pollack at the ER, who has kindly accepted the patient's care and I have spoken to the patient's caregiver who will contact the power of attorney as this patient is a ward of the state. Plan Wound Cleansing: Wound #1 Left Toe Second: Clean wound with Normal Saline. Anesthetic: Wound #1 Left Toe Second: Topical Lidocaine 4% cream applied to wound bed prior to debridement Secondary Dressing: Wound #1 Left Toe Second: Dry Gauze Conform/Kerlix Additional Orders / Instructions: Wound #1 Left Toe Second: Other: - Go straight to ER at [facility]. this 83 year old patient who is a diabetic and has initial signs of sepsis has come to the wound care clinic with a frankly gangrenous to on the left second, with signs of localized cellulitis and early sepsis. I have spoken to Dr. Shaune Pollack at the ER, who has kindly accepted the patient's care and I have spoken to the patient's caregiver  who will contact the power of attorney as this patient is a ward of the state. Electronic Signature(s) Signed: 02/19/2017 12:28:56 PM By: Evlyn Kanner MD, FACS Previous Signature: 02/19/2017 11:13:13 AM Version By: Evlyn Kanner MD, FACS Entered By: Evlyn Kanner on 02/19/2017 12:28:56 [name] [name] ([id number]) -------------------------------------------------------------------------------- ROS/PFSH Details Patient Name: [name] [name]. 02/19/2017 10:30 Date of Service: AM Medical Record [id number] Number: Patient Account Number: [id number] August 30, 1929 (83 y.o. Treating RN: Phillis Haggis Date of Birth/Sex: Female) Other Clinician: Primary Care Provider: Gloriajean Dell, Jasha Hodzic Referring Provider: Daryel November Provider/Extender: Weeks in Treatment: 0 Unable to Obtain Patient History due to oo Dementia Information Obtained From Caregiver Wound History Do you currently have one or more open woundso Yes How many open wounds do you currently haveo 1 Approximately how long have you had your woundso 9/1 How have you been treating your wound(s) until nowo bactroban, keflex Has your wound(s) ever healed and then re-openedo No Have you had any lab work done in the past montho No Have you tested positive for an antibiotic resistant organism (MRSA, VRE)o No Have you tested positive for osteomyelitis (bone infection)o No Have you had any tests for circulation on your legso No Have you had other problems associated with your woundso Swelling Genitourinary Complaints  and Symptoms: Positive for: Incontinence/dribbling Constitutional Symptoms (General Health) Complaints and Symptoms: No Complaints or Symptoms Eyes Complaints and Symptoms: No Complaints or Symptoms Ear/Nose/Mouth/Throat Complaints and Symptoms: No Complaints or Symptoms Hematologic/Lymphatic [name] [name], [name] [name] ([id number]) Complaints and Symptoms: No Complaints or Symptoms Respiratory Medical  History: Positive for: Chronic Obstructive Pulmonary Disease (COPD) Cardiovascular Complaints and Symptoms: Review of System Notes: hx cardiomyopathy hyperlipidemia Medical History: Positive for: Arrhythmia - a-fib, sinus tach; Congestive Heart Failure; Coronary Artery Disease; Hypertension Gastrointestinal Complaints and Symptoms: Review of System Notes: diverticulosis Endocrine Complaints and Symptoms: No Complaints or Symptoms Medical History: Positive for: Type II Diabetes Treated with: Oral agents Blood sugar tested every day: Yes Tested : Immunological Complaints and Symptoms: Review of System Notes: eczema Integumentary (Skin) Complaints and Symptoms: No Complaints or Symptoms Musculoskeletal Medical History: Positive for: Osteoarthritis [name] [name], [name] [name] ([id number]) Neurologic Medical History: Positive for: Dementia Oncologic Complaints and Symptoms: No Complaints or Symptoms Psychiatric Complaints and Symptoms: No Complaints or Symptoms Immunizations Pneumococcal Vaccine: Received Pneumococcal Vaccination: No Implantable Devices Family and Social History Unknown if ever smoked; Marital Status - Widowed; Alcohol Use: Never; Drug Use: No History; Caffeine Use: Daily; Financial Concerns: No; Food, Clothing or Shelter Needs: No; Support System Lacking: No; Transportation Concerns: No; Advanced Directives: No; Patient does not want information on Advanced Directives; Do not resuscitate: No; Living Will: No; Medical Power of Attorney: Yes (Not Provided) Physician Affirmation I have reviewed and agree with the above information. Notes unable to add family history pt is a Alzheimer's pt Psychologist, prison and probation services) Signed: 02/19/2017 4:03:52 PM By: Alejandro Mulling Signed: 02/19/2017 4:18:43 PM By: Evlyn Kanner MD, FACS Entered By: Evlyn Kanner on 02/19/2017 10:32:56 [name] [name], [name] [name]  ([id number]) -------------------------------------------------------------------------------- SuperBill Details Patient Name: [name] [name]. Date of Service: 02/19/2017 Medical Record Number: [id number] Patient Account Number: [id number] Date of Birth/Sex: 1929-10-01 (83 y.o. Female) Treating RN: Ashok Cordia, Debi Primary Care Provider: Karlene Einstein Other Clinician: Referring Provider: Daryel November Treating Provider/Extender: Rudene Re in Treatment: 0 Diagnosis Coding ICD-10 Codes Code Description E11.52 Type 2 diabetes mellitus with diabetic peripheral angiopathy with gangrene L97.523 Non-pressure chronic ulcer of other part of left foot with necrosis of muscle L03.032 Cellulitis of left toe Facility Procedures CPT4 Code: [id number] Description: 99214 - WOUND CARE VISIT-LEV 4 EST PT Modifier: Quantity: 1 Physician Procedures CPT4 Code Description: [id number] 63875 - WC PHYS LEVEL 4 - NEW PT ICD-10 Description Diagnosis E11.52 Type 2 diabetes mellitus with diabetic peripheral an L97.523 Non-pressure chronic ulcer of other part of left foo L03.032 Cellulitis of left toe Modifier: giopathy with t with necrosi Quantity: 1 gangrene s of muscle Electronic Signature(s) Signed: 02/19/2017 4:03:52 PM By: Alejandro Mulling Signed: 02/19/2017 4:18:43 PM By: Evlyn Kanner MD, FACS Previous Signature: 02/19/2017 11:13:36 AM Version By: Evlyn Kanner MD, FACS Previous Signature: 02/19/2017 11:13:25 AM Version By: Evlyn Kanner MD, FACS Entered By: Alejandro Mulling on 02/19/2017 11:15:18

## 2017-02-21 NOTE — Clinical Social Work Note (Signed)
CSW attempted to contact the patient's ALF representative Ardyth Gal [phone]) and left a HIPPA compliant voicemail. The CSW also left a HIPPA compliant voicemail for the patient's DSS Guardian Lazaro Arms. CSW will continue to follow and complete assessment once able to reach collateral individuals.  Argentina Ponder, MSW, Theresia Majors [phone]

## 2017-02-21 NOTE — Consult Note (Signed)
ORTHOPAEDIC CONSULTATION  REQUESTING PHYSICIAN: Milagros Loll, MD  Chief Complaint: gangrene left second toe  HPI: [name] [name] is a 83 y.o. female who complains of  Gangrenous left second toe. Has been seen at the wound care center. Sent to the hospital with gangrenous changes of the second toe for possible amputation.patient with severe dementia not able to provide any review history or review of systems.  Past Medical History:  Diagnosis Date  . A-fib (HCC)   . Allergic rhinitis   . Alzheimer disease   . CAD (coronary artery disease)   . Cardiomyopathy (HCC)   . CHF (congestive heart failure) (HCC)   . COPD (chronic obstructive pulmonary disease) (HCC)   . Dementia   . Diabetes mellitus without complication (HCC)   . Diverticulosis   . Eczema   . Hyperlipemia   . Hypertension   . Urinary incontinence    Past Surgical History:  Procedure Laterality Date  . HYSTEROTOMY    . none    . PACEMAKER PLACEMENT     Social History   Social History  . Marital status: Widowed    Spouse name: N/A  . Number of children: N/A  . Years of education: N/A   Social History Main Topics  . Smoking status: Former Games developer  . Smokeless tobacco: Former Neurosurgeon  . Alcohol use No  . Drug use: No  . Sexual activity: Not Asked   Other Topics Concern  . None   Social History Narrative  . None   Family History  Problem Relation Age of Onset  . Hypertension Father    No Known Allergies Prior to Admission medications   Medication Sig Start Date End Date Taking? Authorizing Provider  albuterol (PROVENTIL HFA;VENTOLIN HFA) 108 (90 Base) MCG/ACT inhaler Inhale 2 puffs into the lungs every 6 (six) hours as needed for wheezing or shortness of breath.   Yes [provider]  amLODipine (NORVASC) 5 MG tablet Take 5 mg by mouth daily.   Yes [provider]  apixaban (ELIQUIS) 5 MG TABS tablet Take 1 tablet (5 mg total) by mouth 2 (two) times daily. 12/11/15  Yes Delfino Lovett, MD   cephALEXin (KEFLEX) 250 MG capsule Take 1 capsule (250 mg total) by mouth 4 (four) times daily. 02/12/17 02/22/17 Yes Emily Filbert, MD  diclofenac sodium (VOLTAREN) 1 % GEL Apply 2 g topically 2 (two) times daily.   Yes [provider]  diltiazem (CARDIZEM) 60 MG tablet Take 1 tablet (60 mg total) by mouth 2 (two) times daily. 12/11/15  Yes Delfino Lovett, MD  fexofenadine (ALLEGRA) 180 MG tablet Take 180 mg by mouth daily.   Yes [provider]  fluticasone (FLONASE) 50 MCG/ACT nasal spray Place 1 spray into both nostrils daily.   Yes [provider]  furosemide (LASIX) 40 MG tablet Take 40 mg by mouth daily.   Yes [provider]  glipiZIDE (GLUCOTROL) 5 MG tablet Take 5 mg by mouth daily.    Yes [provider]  metFORMIN (GLUCOPHAGE) 1000 MG tablet Take 1,000 mg by mouth 2 (two) times daily with a meal.   Yes [provider]  metoprolol tartrate (LOPRESSOR) 25 mg/10 mL SUSP Take 50 mg by mouth 2 (two) times daily.   Yes [provider]  mirtazapine (REMERON) 7.5 MG tablet Take 7.5 mg by mouth at bedtime.   Yes [provider]  mupirocin ointment (BACTROBAN) 2 % Apply to affected area 3 times daily 02/12/17 02/12/18 Yes Mayford Knife,  Cecille Amsterdam, MD  potassium chloride 20 MEQ/15ML (10%) SOLN Take 20 mEq by mouth daily.   Yes [provider]  acetaminophen (TYLENOL) 500 MG tablet Take 1,000 mg by mouth 3 (three) times daily. Take for arthritis pain.    [provider]  ipratropium (ATROVENT) 0.02 % nebulizer solution Take 0.5 mg by nebulization daily.    [provider]  montelukast (SINGULAIR) 10 MG tablet Take 10 mg by mouth every morning.     [provider]  omeprazole (PRILOSEC) 20 MG capsule Take 20 mg by mouth 2 (two) times daily before a meal. Take 30 minutes before breakfast.    [provider]  polyethylene glycol (MIRALAX / GLYCOLAX) packet Take 17 g by mouth every other day.     [provider]   Dg Foot Complete Left  Result Date: 02/19/2017 CLINICAL DATA:  Left second toe gangrene. EXAM: LEFT FOOT - COMPLETE 3+ VIEW COMPARISON:  Left second toe x-rays dated February 12, 2017. FINDINGS: The bones are diffusely osteopenic, which limits evaluation. No definite osteolysis or cortical destruction. No acute fracture or malalignment. Prominent hammertoe deformities of the second through fifth digits. Diffuse soft tissue swelling about the foot. IMPRESSION: No definite radiographic evidence of osteomyelitis. Diffuse soft tissue swelling about the foot heart HO. Electronically Signed   By: Obie Dredge M.D.   On: 02/19/2017 17:03    Positive ROS: All other systems have been reviewed and were otherwise negative with the exception of those mentioned in the HPI and as above.  12 point ROS was performed.  Physical Exam: General: Alert and oriented.  No apparent distress.  Vascular:  Left foot:Dorsalis Pedis:  absent Posterior Tibial:  absent  Right foot: Dorsalis Pedis:  absent Posterior Tibial:  absent Pulses were dopplerable and monophasic diffusely throughout. Neuro:sensation appears to be intact though hard to assess given the patient's severe dementia  Derm:No right foot ulceration.  Left foot with second toe gangrenous changes to the level of the MTPJ. Small open ulceration medially at the PIPJ. No other ulcerative sites. Slight drainage from the open ulceration without purulence.   Ortho/MS: patient is in severe contracture of the lower extremities at the hip and knees. Unable to straighten either leg.No edema to the lower extremities.   Assessment: Gangrene left second toe  Plan: Amputation of the left second toe is Advertising account executive. Consideration for vascular consultation could be performed but patient is in such severe contracture likelihood of revascularization percutaneously is low. Will discuss with vascular surgery but land will be to perform  amputation of the second toe and if concern for healing occurs Will consult vascular surgery for possible revascularization if needed. Can likely performed Tuesday we'll have to discuss with OR for open time for surgery.    Irean Hong, DPM Cell [phone]   02/21/2017 9:43 AM

## 2017-02-22 LAB — GLUCOSE, CAPILLARY
GLUCOSE-CAPILLARY: 55 mg/dL — AB (ref 65–99)
Glucose-Capillary: 99 mg/dL (ref 65–99)
Glucose-Capillary: 113 mg/dL — ABNORMAL HIGH (ref 65–99)
Glucose-Capillary: 135 mg/dL — ABNORMAL HIGH (ref 65–99)
Glucose-Capillary: 48 mg/dL — ABNORMAL LOW (ref 65–99)
Glucose-Capillary: 179 mg/dL — ABNORMAL HIGH (ref 65–99)

## 2017-02-22 MED ORDER — DEXTROSE 50 % IV SOLN
1.0000 | INTRAVENOUS | Status: AC
  Administered 2017-02-22: 50 mL via INTRAVENOUS

## 2017-02-22 MED ORDER — DEXTROSE 50 % IV SOLN
INTRAVENOUS | Status: AC
  Administered 2017-02-22: 50 mL via INTRAVENOUS
  Filled 2017-02-22: qty 50

## 2017-02-22 NOTE — Progress Notes (Signed)
Discussed case with vascular surgery and given clinical scenario with pt severely contracted, non ambulatory and dementia, vascular would not recommend workup or revascularization.  Will proceed with plan for toe amputation and have asked my partners to see pt to plan for amputation likely tomorrow.

## 2017-02-22 NOTE — NC FL2 (Signed)
[place] MEDICAID FL2 LEVEL OF CARE SCREENING TOOL     IDENTIFICATION  Patient Name: [name] [name] Birthdate: [birth year] Sex: female Admission Date (Current Location): 02/19/2017  [facility] and [place] Number:  [address]  ([id number] [facility]) Facility and Address:  [facility], [address] [place], [place] [address]      Provider Number: [id number]  Attending Physician Name and Address:  Milagros Loll, MD  Relative Name and Phone Number:  DSS Guardian Lazaro Arms [phone] or [phone])    Current Level of Care: Hospital Recommended Level of Care: Assisted Living Facility Prior Approval Number:    Date Approved/Denied:   PASRR Number:  [id number] )  Discharge Plan: Domiciliary (Rest home)    Current Diagnoses: Patient Active Problem List   Diagnosis Date Noted  . Pressure injury of skin 02/20/2017  . Gangrene of toe of left foot (HCC) 02/19/2017  . Sinus tachycardia 12/09/2015    Orientation RESPIRATION BLADDER Height & Weight     Self  Normal Incontinent Weight: 105 lb 14.4 oz (48 kg) Height:  5\' 3"  (160 cm)  BEHAVIORAL SYMPTOMS/MOOD NEUROLOGICAL BOWEL NUTRITION STATUS      Incontinent Diet (Diet: DYS 2 )  AMBULATORY STATUS COMMUNICATION OF NEEDS Skin   Total Care Verbally Surgical wounds (incision: ampuation of second toe of left foot. )                       Personal Care Assistance Level of Assistance  Bathing, Feeding, Dressing, Total care Bathing Assistance: Maximum assistance Feeding assistance: Maximum assistance Dressing Assistance: Maximum assistance Total Care Assistance: Maximum assistance   Functional Limitations Info  Sight, Hearing, Speech Sight Info: Adequate Hearing Info: Adequate Speech Info: Adequate    SPECIAL CARE FACTORS FREQUENCY                       Contractures Contractures Info: Not present    Additional Factors Info  Code Status, Allergies Code Status Info:  (DNR ) Allergies  Info:  (No Known Allergies. ) Psychotropic Info: Remeron Insulin Sliding Scale Info: Novolog (1-9 units, TID with meals)       Current Medications (02/22/2017):  This is the current hospital active medication list Current Facility-Administered Medications  Medication Dose Route Frequency Provider Last Rate Last Dose  . acetaminophen (TYLENOL) tablet 650 mg  650 mg Oral Q6H PRN 02/24/2017, MD       Or  . acetaminophen (TYLENOL) suppository 650 mg  650 mg Rectal Q6H PRN Sudini, Srikar, MD      . albuterol (PROVENTIL) (2.5 MG/3ML) 0.083% nebulizer solution 2.5 mg  2.5 mg Nebulization Q2H PRN Sudini, Srikar, MD      . bisacodyl (DULCOLAX) suppository 10 mg  10 mg Rectal Daily PRN Sudini, Milagros Loll, MD      . diltiazem (CARDIZEM) tablet 60 mg  60 mg Oral Q12H Sudini, Srikar, MD   60 mg at 02/22/17 1128  . docusate sodium (COLACE) capsule 100 mg  100 mg Oral BID 02/24/17, MD   100 mg at 02/22/17 1128  . enoxaparin (LOVENOX) injection 40 mg  40 mg Subcutaneous Q24H 02/24/17, MD   40 mg at 02/22/17 1131  . feeding supplement (ENSURE ENLIVE) (ENSURE ENLIVE) liquid 237 mL  237 mL Oral TID BM Sudini, Srikar, MD   237 mL at 02/22/17 1127  . insulin aspart (novoLOG) injection 0-9 Units  0-9 Units Subcutaneous TID WC 02/24/17, MD      .  loratadine (CLARITIN) tablet 10 mg  10 mg Oral Daily Milagros Loll, MD   10 mg at 02/22/17 1128  . mirtazapine (REMERON) tablet 7.5 mg  7.5 mg Oral QHS Milagros Loll, MD   7.5 mg at 02/21/17 2330  . multivitamin with minerals tablet 1 tablet  1 tablet Oral Daily Milagros Loll, MD   1 tablet at 02/22/17 1128  . ondansetron (ZOFRAN) tablet 4 mg  4 mg Oral Q6H PRN Milagros Loll, MD       Or  . ondansetron (ZOFRAN) injection 4 mg  4 mg Intravenous Q6H PRN Sudini, Srikar, MD      . piperacillin-tazobactam (ZOSYN) IVPB 3.375 g  3.375 g Intravenous Q8H Milagros Loll, MD   Stopped at 02/22/17 0445  . polyethylene glycol (MIRALAX / GLYCOLAX) packet 17 g  17  g Oral Daily PRN Sudini, Srikar, MD      . potassium chloride 20 MEQ/15ML (10%) solution 20 mEq  20 mEq Oral Daily Milagros Loll, MD   20 mEq at 02/22/17 1129  . traMADol (ULTRAM) tablet 50 mg  50 mg Oral Q6H PRN Milagros Loll, MD         Discharge Medications: Please see discharge summary for a list of discharge medications.  Relevant Imaging Results:  Relevant Lab Results:   Additional Information  (SSN: [id number])  Sample, Darleen Crocker, LCSW

## 2017-02-22 NOTE — Progress Notes (Signed)
Patient Demographics  [name] [name], is a 83 y.o. female   MRN: [id number]   DOB - [birth year]  Admit Date - 02/19/2017    Outpatient Primary MD for the patient is Dasanayaka, Newton Pigg, MD  Consult requested in the Hospital by Milagros Loll, MD, On 02/22/2017   With History of -  Past Medical History:  Diagnosis Date  . A-fib (HCC)   . Allergic rhinitis   . Alzheimer disease   . CAD (coronary artery disease)   . Cardiomyopathy (HCC)   . CHF (congestive heart failure) (HCC)   . COPD (chronic obstructive pulmonary disease) (HCC)   . Dementia   . Diabetes mellitus without complication (HCC)   . Diverticulosis   . Eczema   . Hyperlipemia   . Hypertension   . Urinary incontinence       Past Surgical History:  Procedure Laterality Date  . HYSTEROTOMY    . none    . PACEMAKER PLACEMENT      in for   Chief Complaint  Patient presents with  . Foot Pain     HPI  [name] [name]  is a 83 y.o. female,patient and followed by Dr. Ether Griffins for a gangrenous toe. Vascular felt that there was not much more they can do to improve her circulation and Dr. Ether Griffins had her scheduled for surgery tomorrow to amputate a gangrenous second toe on the left foot.she currently is a ward of the state  Is of the auspices of department of social services this point. Patient also has joint contractures and is basically nonambulatory at this point.    Social History Social History  Substance Use Topics  . Smoking status: Former Games developer  . Smokeless tobacco: Former Neurosurgeon  . Alcohol use No     Family History Family History  Problem Relation Age of Onset  . Hypertension Father      Prior to Admission medications   Medication Sig Start Date End Date Taking? Authorizing Provider  albuterol (PROVENTIL HFA;VENTOLIN HFA) 108 (90  Base) MCG/ACT inhaler Inhale 2 puffs into the lungs every 6 (six) hours as needed for wheezing or shortness of breath.   Yes [provider]  amLODipine (NORVASC) 5 MG tablet Take 5 mg by mouth daily.   Yes [provider]  apixaban (ELIQUIS) 5 MG TABS tablet Take 1 tablet (5 mg total) by mouth 2 (two) times daily. 12/11/15  Yes Delfino Lovett, MD  cephALEXin (KEFLEX) 250 MG capsule Take 1 capsule (250 mg total) by mouth 4 (four) times daily. 02/12/17 02/22/17 Yes Emily Filbert, MD  diclofenac sodium (VOLTAREN) 1 % GEL Apply 2 g topically 2 (two) times daily.   Yes [provider]  diltiazem (CARDIZEM) 60 MG tablet Take 1 tablet (60 mg total) by mouth 2 (two) times daily. 12/11/15  Yes Delfino Lovett, MD  fexofenadine (ALLEGRA) 180 MG tablet Take 180 mg by mouth daily.   Yes [provider]  fluticasone (FLONASE) 50 MCG/ACT nasal spray Place 1 spray into both nostrils daily.   Yes [provider]  furosemide (LASIX) 40 MG tablet Take 40 mg by mouth daily.   Yes [provider]  glipiZIDE (GLUCOTROL)  5 MG tablet Take 5 mg by mouth daily.    Yes [provider]  metFORMIN (GLUCOPHAGE) 1000 MG tablet Take 1,000 mg by mouth 2 (two) times daily with a meal.   Yes [provider]  metoprolol tartrate (LOPRESSOR) 25 mg/10 mL SUSP Take 50 mg by mouth 2 (two) times daily.   Yes [provider]  mirtazapine (REMERON) 7.5 MG tablet Take 7.5 mg by mouth at bedtime.   Yes [provider]  mupirocin ointment (BACTROBAN) 2 % Apply to affected area 3 times daily 02/12/17 02/12/18 Yes Emily Filbert, MD  potassium chloride 20 MEQ/15ML (10%) SOLN Take 20 mEq by mouth daily.   Yes [provider]  acetaminophen (TYLENOL) 500 MG tablet Take 1,000 mg by mouth 3 (three) times daily. Take for arthritis pain.    [provider]  ipratropium (ATROVENT) 0.02 % nebulizer solution Take 0.5 mg by nebulization daily.     [provider]  montelukast (SINGULAIR) 10 MG tablet Take 10 mg by mouth every morning.     [provider]  omeprazole (PRILOSEC) 20 MG capsule Take 20 mg by mouth 2 (two) times daily before a meal. Take 30 minutes before breakfast.    [provider]  polyethylene glycol (MIRALAX / GLYCOLAX) packet Take 17 g by mouth every other day.    [provider]    Anti-infectives    Start     Dose/Rate Route Frequency Ordered Stop   02/20/17 0100  piperacillin-tazobactam (ZOSYN) IVPB 3.375 g     3.375 g 12.5 mL/hr over 240 Minutes Intravenous Every 8 hours 02/19/17 2011     02/19/17 1914  piperacillin-tazobactam (ZOSYN) 3-0.375 GM/50ML IVPB    Comments:  Luretha Murphy   : cabinet override      02/19/17 1914 02/20/17 0729   02/19/17 1900  piperacillin-tazobactam (ZOSYN) IVPB 3.375 g     3.375 g 100 mL/hr over 30 Minutes Intravenous  Once 02/19/17 1845 02/21/17 0124      Scheduled Meds: . diltiazem  60 mg Oral Q12H  . docusate sodium  100 mg Oral BID  . enoxaparin (LOVENOX) injection  40 mg Subcutaneous Q24H  . feeding supplement (ENSURE ENLIVE)  237 mL Oral TID BM  . insulin aspart  0-9 Units Subcutaneous TID WC  . loratadine  10 mg Oral Daily  . mirtazapine  7.5 mg Oral QHS  . multivitamin with minerals  1 tablet Oral Daily  . potassium chloride  20 mEq Oral Daily   Continuous Infusions: . piperacillin-tazobactam (ZOSYN)  IV Stopped (02/22/17 0445)   PRN Meds:.acetaminophen **OR** acetaminophen, albuterol, bisacodyl, ondansetron **OR** ondansetron (ZOFRAN) IV, polyethylene glycol, traMADol  No Known Allergies  Physical Exam  Vitals  Blood pressure (!) 90/58, pulse 98, temperature 98.4 F (36.9 C), temperature source Oral, resp. rate 17, height 5\' 3"  (1.6 m), weight 48 kg (105 lb 14.4 oz), SpO2 97 %.  Lower Extremity exam:  Vascular:difficult to palpate pulses bilaterally but patient has fairly warm skin with regional capillary refill  times to the great toe. Second toe left is obviously lost circulation is gangrenous to the juncture of skin at the MTP joint area.  Dermatological:fingers changes noted above Patient has a inflamed area on the fifth metatarsal head of the right foot on the skin but is not broken down pair early. No evidence of redness cellulitis or infection at this timeframe.  Neurological:some peripheral neuropathy.  Ortho:gangrenous second toe left foot. Patient has severe contractures across  the left foot dorsal extension deformities to the toes with flexion contracture to the distal toes. Patient has severe contracture to the hip and knee of the left lower extremity as well.  Data Review  CBC  Recent Labs Lab 02/19/17 1641 02/20/17 0551  WBC 7.3 6.3  HGB 12.5 11.3*  HCT 36.0 32.5*  PLT 422 340  MCV 109.4* 110.2*  MCH 37.9* 38.2*  MCHC 34.7 34.7  RDW 15.4* 14.9*  LYMPHSABS 2.0  --   MONOABS 0.7  --   EOSABS 0.0  --   BASOSABS 0.1  --    ------------------------------------------------------------------------------------------------------------------  Chemistries   Recent Labs Lab 02/20/17 0551  NA 137  K 3.2*  CL 107  CO2 21*  GLUCOSE 101*  BUN 9  CREATININE 0.52  CALCIUM 7.9*   ----------------------------------------------------------------------------------------------------------------------  Urinalysis    Component Value Date/Time   COLORURINE YELLOW (A) 12/14/2015 0838   APPEARANCEUR CLOUDY (A) 12/14/2015 0838   LABSPEC 1.005 12/14/2015 0838   PHURINE 6.0 12/14/2015 0838   GLUCOSEU NEGATIVE 12/14/2015 0838   HGBUR 1+ (A) 12/14/2015 0838   BILIRUBINUR NEGATIVE 12/14/2015 0838   KETONESUR NEGATIVE 12/14/2015 0838   PROTEINUR NEGATIVE 12/14/2015 0838   NITRITE NEGATIVE 12/14/2015 0838   LEUKOCYTESUR 1+ (A) 12/14/2015 0838       Assessment & Plan: Gangrenous left second toe. Patient's been evaluated by vascular as well as by Dr. Faylene Million option appears to  be removal of the gangrenous toe. I'm in agreement with this as the best way to try to handle thissituation to this timeframe. I spoke withSusan Earl Gala from the Department of Social Services and received a verbal consent to proceed. I will try to get a a fax sent her for written consent as well.I will go and plan on accomplishing this tomorrow. Active Problems:   Gangrene of toe of left foot (HCC)   Pressure injury of skin     Family Communication: Plan discussed with Department of Social Services   Beatryce Colombo G M.D on 02/22/2017 at 12:38 PM  Thank you for the consult, we will follow the patient with you in the Hospital.

## 2017-02-22 NOTE — Clinical Social Work Note (Signed)
Clinical Social Work Assessment  Patient Details  Name: [name] [name] MRN: [id number] Date of Birth: Mar 10, 1930  Date of referral:  02/22/17               Reason for consult:  Other (Comment Required) (From [name] )                Permission sought to share information with:  Oceanographer granted to share information::  Yes, Verbal Permission Granted  Name::      [name] [name]   Agency::     Relationship::     Contact Information:     Housing/Transportation Living arrangements for the past 2 months:  Assisted Dealer of Information:  Facility, Guardian Patient Interpreter Needed:  None Criminal Activity/Legal Involvement Pertinent to Current Situation/Hospitalization:  No - Comment as needed Significant Relationships:  Other(Comment) (DSS ) Lives with:  Facility Resident Do you feel safe going back to the place where you live?    Need for family participation in patient care:  Yes (Comment)  Care giving concerns:  Patient is a resident at [name] (fax: [phone]).    Social Worker assessment / plan:  Visual merchandiser (CSW) reviewed chart and noted that patient is from [name] and has an [place] DSS guardian. CSW contacted [place] owner of [name] to get additional information. Per [name] patient has been a resident for [name] and is total care. Per [name] patient needs assistance with feeding, bathing and dressing. Per [name] patient uses the wheel chair and does not transport herself. Per [name] patient can return to [name] as long as she does not require IV ABX. CSW contacted DSS guardian [name] [name] [phone]. Per [name] DSS is guardian and guardianship paper work was faxed to 1A and CSW placed it on chart. Per [name] [name] DSS director will give consent for toe amputation. [name] is agreeable for patient to return to [name] if IV ABX is not required. CSW will continue to follow and  assist as needed.   Employment status:  Disabled (Comment on whether or not currently receiving Disability), Retired [name] and safety inspector:  Medicare, Medicaid [place] PT Recommendations:  Not assessed at this time Information / Referral to community resources:  Other (Comment Required) (Return to [name] )  Patient/Family's Response to care:  Patient's DSS guardian is agreeable to toe amputation.   Patient/Family's Understanding of and Emotional Response to Diagnosis, Current Treatment, and Prognosis:  Patient has a DSS guardian.   Emotional Assessment Appearance:  Appears stated age Attitude/Demeanor/Rapport:  Unable to Assess Affect (typically observed):  Unable to Assess Orientation:  Oriented to Self Alcohol / Substance use:  Not Applicable Psych involvement (Current and /or in the community):  No (Comment)  Discharge Needs  Concerns to be addressed:  Discharge Planning Concerns Readmission within the last 30 days:  No Current discharge risk:  Dependent with Mobility, Chronically ill, Cognitively Impaired Barriers to Discharge:  Continued Medical Work up   [employer], Darleen Crocker, LCSW 02/22/2017, 3:31 PM

## 2017-02-22 NOTE — Progress Notes (Signed)
Pharmacy Antibiotic Note  [name] [name] is a 83 y.o. female admitted on 02/19/2017 with left toe gangrene. Pharmacy has been consulted for Zosyn dosing.  Plan: Zosyn 3.375g IV q8h (4 hour infusion).  Height: 5\' 3"  (160 cm) Weight: 105 lb 14.4 oz (48 kg) IBW/kg (Calculated) : 52.4  Temp (24hrs), Avg:98.9 F (37.2 C), Min:98.4 F (36.9 C), Max:99.5 F (37.5 C)   Recent Labs Lab 02/19/17 1641 02/20/17 0551  WBC 7.3 6.3  CREATININE  --  0.52  LATICACIDVEN 3.2* 1.5    Estimated Creatinine Clearance: 39 mL/min (by C-G formula based on SCr of 0.52 mg/dL).    No Known Allergies  Antimicrobials this admission: Zosyn 10/12 >>   Dose adjustments this admission:  Microbiology results: 10/12 BCx: NGTD MRSA PCR: neg  Thank you for allowing pharmacy to be a part of this patient's care.  12/12 02/22/2017 8:56 AM

## 2017-02-22 NOTE — Consult Note (Signed)
WOC Nurse wound consult note Reason for Consult: Stage 3 pressure injury to left hip.  Gangrenous left second toe, followed by podiatry and vascular teams.   Wound type:Stage 3 pressure injury, present on admission.  Pressure Injury POA: Yes Measurement:2 cm x 1.5 cm x 0.2 cm with 25% fibrin slough to wound bed.  Wound bed:75% pink and moist, 25% fibrin slough Drainage (amount, consistency, odor) minimal serosanguinous Periwound:intact.  Very thin with bony prominences at risk for breakdown Dressing procedure/placement/frequency:No disposable briefs or underpads while in bed.  Prompt incontinence care.  Silicone border foam to left hip wound.  Change every three days and PRN soilage.  Will not follow at this time.  Please re-consult if needed.  Maple Hudson RN BSN [facility] Pager [phone]

## 2017-02-22 NOTE — Progress Notes (Signed)
[facility] - [place] at [facility]   PATIENT NAME: [name] [name]    MR#:  [id number]  DATE OF BIRTH:  [birth year]  SUBJECTIVE:  CHIEF COMPLAINT:   Chief Complaint  Patient presents with  . Foot Pain   Patient is confused.  Poor PO intake.  Unable to give any history  REVIEW OF SYSTEMS:    Review of Systems  Unable to perform ROS: Dementia    DRUG ALLERGIES:  No Known Allergies  VITALS:  Blood pressure (!) 90/58, pulse 98, temperature 98.4 F (36.9 C), temperature source Oral, resp. rate 17, height 5\' 3"  (1.6 m), weight 48 kg (105 lb 14.4 oz), SpO2 97 %.  PHYSICAL EXAMINATION:   Physical Exam  GENERAL:  83 y.o.-year-old patient lying in the bed with no acute distress.  EYES: Pupils equal, round, reactive to light and accommodation. No scleral icterus. Extraocular muscles intact.  HEENT: Head atraumatic, normocephalic. Oropharynx and nasopharynx clear.  NECK:  Supple, no jugular venous distention. No thyroid enlargement, no tenderness.  LUNGS: Normal breath sounds bilaterally, no wheezing, rales, rhonchi. No use of accessory muscles of respiration.  CARDIOVASCULAR: S1, S2 normal. No murmurs, rubs, or gallops.  ABDOMEN: Soft, nontender, nondistended. Bowel sounds present. No organomegaly or mass.  EXTREMITIES: No cyanosis, clubbing or edema b/l.    NEUROLOGIC: Cranial nerves II through XII are intact. No focal Motor or sensory deficits b/l.   PSYCHIATRIC: The patient is confused SKIN: left second toe is dry, black.  LABORATORY PANEL:   CBC  Recent Labs Lab 02/20/17 0551  WBC 6.3  HGB 11.3*  HCT 32.5*  PLT 340   ------------------------------------------------------------------------------------------------------------------ Chemistries   Recent Labs Lab 02/20/17 0551  NA 137  K 3.2*  CL 107  CO2 21*  GLUCOSE 101*  BUN 9  CREATININE 0.52  CALCIUM 7.9*    ------------------------------------------------------------------------------------------------------------------  Cardiac Enzymes No results for input(s): TROPONINI in the last 168 hours. ------------------------------------------------------------------------------------------------------------------  RADIOLOGY:  No results found.   ASSESSMENT AND PLAN:   * Left second toe gangrene with cellulitis. Sepsis present on admission.  IV Zosyn.  blood cultures No growth to date. Consulted podiatry for amputation of the toe. Surgery tomorrow  * Chronic diastolic congestive heart failure. No signs of fluid overload.  * Diabetes mellitus. oral hypoglycemics stopped. On sliding scale insulin.  * Dementia. Monitor for inpatient delirium.  * Paroxysmal atrial fibrillation. Started Cardizem. DC metoprolol due to low normal blood pressure Hold Eliquis for surgery.  * DVT prophylaxis with Lovenox.  All the records are reviewed and case discussed with Care Management/Social Workerr. Management plans discussed with the patient, family and they are in agreement.  CODE STATUS: DNR  TOTAL CC TIME TAKING CARE OF THIS PATIENT:30 minutes.   POSSIBLE D/C IN 2-3 DAYS, DEPENDING ON CLINICAL CONDITION.  02/22/17 R M.D on 02/22/2017 at 11:26 AM  Between 7am to 6pm - Pager - [phone]  After 6pm go to [email or web address] - password [email or web address] [facility]  [facility] [place] [facility]  Office  [phone]  CC: Primary care physician; [phone], MD  Note: This dictation was prepared with Dragon dictation along with smaller phrase technology. Any transcriptional errors that result from this process are unintentional.

## 2017-02-23 DIAGNOSIS — I96 Gangrene, not elsewhere classified: Secondary | ICD-10-CM

## 2017-02-23 DIAGNOSIS — I5032 Chronic diastolic (congestive) heart failure: Secondary | ICD-10-CM

## 2017-02-23 DIAGNOSIS — I4891 Unspecified atrial fibrillation: Secondary | ICD-10-CM

## 2017-02-23 DIAGNOSIS — M79605 Pain in left leg: Secondary | ICD-10-CM

## 2017-02-23 LAB — BASIC METABOLIC PANEL
ANION GAP: 11 (ref 5–15)
BUN: <5 mg/dL — ABNORMAL LOW (ref 6–20)
CALCIUM: 8.4 mg/dL — AB (ref 8.9–10.3)
CO2: 25 mmol/L (ref 22–32)
CREATININE: 0.49 mg/dL (ref 0.44–1.00)
Chloride: 107 mmol/L (ref 101–111)
Glucose, Bld: 120 mg/dL — ABNORMAL HIGH (ref 65–99)
Potassium: 2.6 mmol/L — CL (ref 3.5–5.1)
SODIUM: 143 mmol/L (ref 135–145)

## 2017-02-23 LAB — CBC WITH DIFFERENTIAL/PLATELET
BASOS ABS: 0 10*3/uL (ref 0–0.1)
BASOS PCT: 1 %
EOS ABS: 0 10*3/uL (ref 0–0.7)
Eosinophils Relative: 0 %
HEMATOCRIT: 37.6 % (ref 35.0–47.0)
Hemoglobin: 12.9 g/dL (ref 12.0–16.0)
Lymphocytes Relative: 17 %
Lymphs Abs: 1 10*3/uL (ref 1.0–3.6)
MCH: 38.1 pg — ABNORMAL HIGH (ref 26.0–34.0)
MCHC: 34.2 g/dL (ref 32.0–36.0)
MCV: 111.4 fL — ABNORMAL HIGH (ref 80.0–100.0)
MONO ABS: 0.6 10*3/uL (ref 0.2–0.9)
Monocytes Relative: 10 %
NEUTROS ABS: 4.4 10*3/uL (ref 1.4–6.5)
NEUTROS PCT: 72 %
Platelets: 364 10*3/uL (ref 150–440)
RBC: 3.38 MIL/uL — ABNORMAL LOW (ref 3.80–5.20)
RDW: 15 % — AB (ref 11.5–14.5)
WBC: 6.1 10*3/uL (ref 3.6–11.0)

## 2017-02-23 LAB — POTASSIUM: Potassium: 3.9 mmol/L (ref 3.5–5.1)

## 2017-02-23 LAB — MAGNESIUM: MAGNESIUM: 1.6 mg/dL — AB (ref 1.7–2.4)

## 2017-02-23 LAB — GLUCOSE, CAPILLARY
GLUCOSE-CAPILLARY: 105 mg/dL — AB (ref 65–99)
GLUCOSE-CAPILLARY: 113 mg/dL — AB (ref 65–99)
Glucose-Capillary: 117 mg/dL — ABNORMAL HIGH (ref 65–99)
Glucose-Capillary: 198 mg/dL — ABNORMAL HIGH (ref 65–99)

## 2017-02-23 MED ORDER — SODIUM CHLORIDE 0.9 % IV BOLUS (SEPSIS)
500.0000 mL | Freq: Once | INTRAVENOUS | Status: AC
  Administered 2017-02-23: 500 mL via INTRAVENOUS

## 2017-02-23 MED ORDER — MAGNESIUM OXIDE 400 (241.3 MG) MG PO TABS
400.0000 mg | ORAL_TABLET | Freq: Every day | ORAL | Status: DC
  Administered 2017-02-23 – 2017-03-03 (×8): 400 mg via ORAL
  Filled 2017-02-23 (×8): qty 1

## 2017-02-23 MED ORDER — DIGOXIN 0.25 MG/ML IJ SOLN
0.2500 mg | Freq: Two times a day (BID) | INTRAMUSCULAR | Status: AC
  Administered 2017-02-23 (×2): 0.25 mg via INTRAVENOUS
  Filled 2017-02-23 (×2): qty 1

## 2017-02-23 MED ORDER — POTASSIUM CHLORIDE 10 MEQ/100ML IV SOLN
10.0000 meq | INTRAVENOUS | Status: DC
  Administered 2017-02-23 (×2): 10 meq via INTRAVENOUS
  Filled 2017-02-23 (×2): qty 100

## 2017-02-23 MED ORDER — POTASSIUM CHLORIDE 20 MEQ PO PACK
40.0000 meq | PACK | ORAL | Status: AC
  Administered 2017-02-23: 40 meq via ORAL
  Filled 2017-02-23 (×2): qty 2

## 2017-02-23 MED ORDER — DIGOXIN 125 MCG PO TABS
0.1250 mg | ORAL_TABLET | Freq: Every day | ORAL | Status: DC
  Administered 2017-02-24 – 2017-02-28 (×5): 0.125 mg via ORAL
  Filled 2017-02-23 (×6): qty 1

## 2017-02-23 MED ORDER — DILTIAZEM HCL 100 MG IV SOLR
5.0000 mg/h | INTRAVENOUS | Status: DC
  Administered 2017-02-23: 12.5 mg/h via INTRAVENOUS
  Administered 2017-02-23: 5 mg/h via INTRAVENOUS
  Administered 2017-02-23: 15 mg/h via INTRAVENOUS
  Administered 2017-02-23: 10 mg/h via INTRAVENOUS
  Filled 2017-02-23: qty 100

## 2017-02-23 NOTE — Progress Notes (Signed)
[facility] - [place] at [facility]   PATIENT NAME: [name] [name]    MR#:  [id number]  DATE OF BIRTH:  1930-01-29  SUBJECTIVE:  CHIEF COMPLAINT:   Chief Complaint  Patient presents with  . Foot Pain   Patient is confused.  Poor PO intake.  Unable to give any history  Surgery cancelled due to hypokalemia  REVIEW OF SYSTEMS:    Review of Systems  Unable to perform ROS: Dementia    DRUG ALLERGIES:  No Known Allergies  VITALS:  Blood pressure (!) 99/48, pulse (!) 50, temperature (!) 97.3 F (36.3 C), temperature source Axillary, resp. rate 18, height 5\' 3"  (1.6 m), weight 48 kg (105 lb 14.4 oz), SpO2 95 %.  PHYSICAL EXAMINATION:   Physical Exam  GENERAL:  83 y.o.-year-old patient lying in the bed with no acute distress.  EYES: Pupils equal, round, reactive to light and accommodation. No scleral icterus. Extraocular muscles intact.  HEENT: Head atraumatic, normocephalic. Oropharynx and nasopharynx clear.  NECK:  Supple, no jugular venous distention. No thyroid enlargement, no tenderness.  LUNGS: Normal breath sounds bilaterally, no wheezing, rales, rhonchi. No use of accessory muscles of respiration.  CARDIOVASCULAR: S1, S2 normal. No murmurs, rubs, or gallops.  ABDOMEN: Soft, nontender, nondistended. Bowel sounds present. No organomegaly or mass.  EXTREMITIES: No cyanosis, clubbing or edema b/l.    NEUROLOGIC: Moves all 4 extremities  PSYCHIATRIC: The patient is confused SKIN: left second toe is dry, black.  LABORATORY PANEL:   CBC  Recent Labs Lab 02/23/17 0543  WBC 6.1  HGB 12.9  HCT 37.6  PLT 364   ------------------------------------------------------------------------------------------------------------------ Chemistries   Recent Labs Lab 02/23/17 0543  NA 143  K 2.6*  CL 107  CO2 25  GLUCOSE 120*  BUN <5*  CREATININE 0.49  CALCIUM 8.4*    ------------------------------------------------------------------------------------------------------------------  Cardiac Enzymes No results for input(s): TROPONINI in the last 168 hours. ------------------------------------------------------------------------------------------------------------------  RADIOLOGY:  No results found.   ASSESSMENT AND PLAN:   * Hypokalemia Replace IV and PO  * Left second toe gangrene with cellulitis. Sepsis present on admission.  IV Zosyn.  blood cultures - Negative Consulted podiatry for amputation of the toe.  Surgery cancelled due to hypokalemia  * Chronic diastolic congestive heart failure. No signs of fluid overload.  * Diabetes mellitus. oral hypoglycemics stopped. On sliding scale insulin.  * Dementia. Monitor for inpatient delirium.  * Paroxysmal atrial fibrillation. Started Cardizem. DC metoprolol due to low normal blood pressure Hold Eliquis for surgery. Today has RVR due to sepsis  * DVT prophylaxis with Lovenox.  All the records are reviewed and case discussed with Care Management/Social Workerr. Management plans discussed with the patient, family and they are in agreement.  CODE STATUS: DNR  TOTAL TIME TAKING CARE OF THIS PATIENT:30 minutes.   POSSIBLE D/C IN 2-3 DAYS, DEPENDING ON CLINICAL CONDITION.  02/25/17 R M.D on 02/23/2017 at 10:26 AM  Between 7am to 6pm - Pager - [phone]  After 6pm go to [email or web address] - password [email or web address] [facility]  [facility] [place] [facility]  Office  [phone]  CC: Primary care physician; [phone], MD  Note: This dictation was prepared with Dragon dictation along with smaller phrase technology. Any transcriptional errors that result from this process are unintentional.

## 2017-02-23 NOTE — Progress Notes (Signed)
Inpatient Diabetes Program Recommendations  AACE/ADA: New Consensus Statement on Inpatient Glycemic Control (2015)  Target Ranges:  Prepandial:   less than 140 mg/dL      Peak postprandial:   less than 180 mg/dL (1-2 hours)      Critically ill patients:  140 - 180 mg/dL  Results for [name] [name], [name] [name] (MRN [id number]) as of 02/23/2017 09:48  Ref. Range 02/22/2017 07:50 02/22/2017 12:10 02/22/2017 16:46 02/22/2017 21:21 02/22/2017 21:23 02/22/2017 22:31 02/23/2017 08:02  Glucose-Capillary Latest Ref Range: 65 - 99 mg/dL 99 355 (H) 974 (H)  Novolog 1 unit 48 (L) 55 (L) 179 (H) 105 (H)   Results for [name] [name], [name] [name] (MRN [id number]) as of 02/23/2017 09:48  Ref. Range 02/19/2017 16:41  Hemoglobin A1C Latest Ref Range: 4.8 - 5.6 % 5.0   Review of Glycemic Control  Diabetes history: DM2 Outpatient Diabetes medications: Metformin 1000 mg BID, Glipizide 5 mg daily Current orders for Inpatient glycemic control: Novolog 0-9 units TID with meals  Inpatient Diabetes Program Recommendations: Correction (SSI): Please consider discontinuing Novolog correction scale. Oral Agents: At time of discharge, likley need to re-evaluate outpatient DM medications. A1C 5%on 02/19/17 indicating an average glucose of 97 mg/dl. May want to discontinue Glipizide as an outpatient and have patient follow up with PCP regarding DM control.  Thanks, Orlando Penner, RN, MSN, CDE Diabetes Coordinator Inpatient Diabetes Program [phone] (Team Pager from 8am to 5pm)

## 2017-02-23 NOTE — Consult Note (Signed)
[facility] Vascular Consult Note  MRN : [id number]  [name] [name] is a 83 y.o. (August 09, 1929) female who presents with chief complaint of  Chief Complaint  Patient presents with  . Foot Pain  .  History of Present Illness: I am asked to evaluate the patient by Dr. Ether Griffins for atherosclerotic occlusive disease in association with profound flexion contractures and gangrene of the left foot as well as bilateral foot ulcers.  Patient is bedridden.  Patient is a 83 year old woman who presented to the [facility] proximally 4 days ago with increasing foot pain.  She was being followed in the wound care center and subsequently sent directly to the emergency room for "amputation".  In the emergency room she was found to be tachycardic and acidotic meeting the criteria of sepsis.  She has been admitted to the hospital.  She is a DO NOT RESUSCITATE and a ward of the state.  The patient has a known history of COPD diabetes mellitus atrial fibrillation dementia and CHF.  She is verbal but noncommunicative.  Current Facility-Administered Medications  Medication Dose Route Frequency Provider Last Rate Last Dose  . acetaminophen (TYLENOL) tablet 650 mg  650 mg Oral Q6H PRN Milagros Loll, MD       Or  . acetaminophen (TYLENOL) suppository 650 mg  650 mg Rectal Q6H PRN Sudini, Srikar, MD      . albuterol (PROVENTIL) (2.5 MG/3ML) 0.083% nebulizer solution 2.5 mg  2.5 mg Nebulization Q2H PRN Sudini, Wardell Heath, MD      . bisacodyl (DULCOLAX) suppository 10 mg  10 mg Rectal Daily PRN Sudini, Wardell Heath, MD      . digoxin (LANOXIN) 0.25 MG/ML injection 0.25 mg  0.25 mg Intravenous Q12H Sudini, Srikar, MD   0.25 mg at 02/23/17 1230  . [START ON 02/24/2017] digoxin (LANOXIN) tablet 0.125 mg  0.125 mg Oral Daily Sudini, Srikar, MD      . diltiazem (CARDIZEM) 100 mg in dextrose 5 % 100 mL (1 mg/mL) infusion  5-15 mg/hr Intravenous Titrated Milagros Loll, MD 5 mL/hr at 02/23/17  1845 5 mg/hr at 02/23/17 1845  . diltiazem (CARDIZEM) tablet 60 mg  60 mg Oral Q12H Sudini, Wardell Heath, MD   60 mg at 02/23/17 1021  . docusate sodium (COLACE) capsule 100 mg  100 mg Oral BID Milagros Loll, MD   100 mg at 02/23/17 1214  . enoxaparin (LOVENOX) injection 40 mg  40 mg Subcutaneous Q24H Milagros Loll, MD   40 mg at 02/22/17 1131  . feeding supplement (ENSURE ENLIVE) (ENSURE ENLIVE) liquid 237 mL  237 mL Oral TID BM Sudini, Srikar, MD   237 mL at 02/22/17 1127  . loratadine (CLARITIN) tablet 10 mg  10 mg Oral Daily Milagros Loll, MD   10 mg at 02/23/17 1215  . magnesium oxide (MAG-OX) tablet 400 mg  400 mg Oral Daily Milagros Loll, MD   400 mg at 02/23/17 1732  . mirtazapine (REMERON) tablet 7.5 mg  7.5 mg Oral QHS Sudini, Wardell Heath, MD   7.5 mg at 02/22/17 2240  . multivitamin with minerals tablet 1 tablet  1 tablet Oral Daily Milagros Loll, MD   1 tablet at 02/23/17 1215  . ondansetron (ZOFRAN) tablet 4 mg  4 mg Oral Q6H PRN Sudini, Wardell Heath, MD       Or  . ondansetron (ZOFRAN) injection 4 mg  4 mg Intravenous Q6H PRN Sudini, Srikar, MD      . piperacillin-tazobactam (ZOSYN) IVPB 3.375 g  3.375 g Intravenous Q8H Milagros Loll, MD   Stopped at 02/23/17 1500  . polyethylene glycol (MIRALAX / GLYCOLAX) packet 17 g  17 g Oral Daily PRN Sudini, Srikar, MD      . potassium chloride 20 MEQ/15ML (10%) solution 20 mEq  20 mEq Oral Daily Milagros Loll, MD   20 mEq at 02/23/17 1635  . traMADol (ULTRAM) tablet 50 mg  50 mg Oral Q6H PRN Milagros Loll, MD        Past Medical History:  Diagnosis Date  . A-fib (HCC)   . Allergic rhinitis   . Alzheimer disease   . CAD (coronary artery disease)   . Cardiomyopathy (HCC)   . CHF (congestive heart failure) (HCC)   . COPD (chronic obstructive pulmonary disease) (HCC)   . Dementia   . Diabetes mellitus without complication (HCC)   . Diverticulosis   . Eczema   . Hyperlipemia   . Hypertension   . Urinary incontinence     Past Surgical  History:  Procedure Laterality Date  . HYSTEROTOMY    . none    . PACEMAKER PLACEMENT      Social History Social History  Substance Use Topics  . Smoking status: Former Games developer  . Smokeless tobacco: Former Neurosurgeon  . Alcohol use No    Family History Family History  Problem Relation Age of Onset  . Hypertension Father   No family history of bleeding/clotting disorders, porphyria or autoimmune disease   No Known Allergies   REVIEW OF SYSTEMS (Negative unless checked)  Constitutional: [] Weight loss  [] Fever  [] Chills Cardiac: [] Chest pain   [] Chest pressure   [] Palpitations   [] Shortness of breath when laying flat   [] Shortness of breath at rest   [] Shortness of breath with exertion. Vascular:  [] Pain in legs with walking   [] Pain in legs at rest   [x] Pain in legs when laying flat   [] Claudication   [] Pain in feet when walking  [x] Pain in feet at rest  [] Pain in feet when laying flat   [] History of DVT   [] Phlebitis   [] Swelling in legs   [] Varicose veins   [x] Non-healing ulcers Pulmonary:   [] Uses home oxygen   [] Productive cough   [] Hemoptysis   [] Wheeze  [] COPD   [] Asthma Neurologic:   Dementia[x] [] Blackouts   [] Seizures   [] History of stroke   [] History of TIA  [] Aphasia   [] Temporary blindness   [] Dysphagia   [x] Weakness or numbness in arms   [x] Weakness or numbness in legs Musculoskeletal:  [] Arthritis   [] Joint swelling   [] Joint pain   [] Low back pain Hematologic:  [] Easy bruising  [] Easy bleeding   [] Hypercoagulable state   [] Anemic  [] Hepatitis Gastrointestinal:  [] Blood in stool   [] Vomiting blood  [] Gastroesophageal reflux/heartburn   [] Difficulty swallowing. Genitourinary:  [] Chronic kidney disease   [] Difficult urination  [] Frequent urination  [] Burning with urination   [] Blood in urine Skin:  [] Rashes   [] Ulcers   [] Wounds Psychological:  [] History of anxiety   []  History of major depression.  Physical Examination  Vitals:   02/22/17 1646 02/22/17 2003 02/23/17 1726  02/23/17 1837  BP: 109/65 (!) 99/48 100/65 (!) 153/97  Pulse: (!) 105 (!) 50 (!) 127 (!) 126  Resp: 18 18 18 18   Temp: (!) 97.5 F (36.4 C) (!) 97.3 F (36.3 C) 98.5 F (36.9 C) 98.2 F (36.8 C)  TempSrc: Oral Axillary Oral Oral  SpO2: 94% 95% 100% 100%  Weight:  Height:       Body mass index is 18.76 kg/m. Gen: Patient is lying in the fetal position with profound flexion contractures of all extremities, mild acute distress Head: [place]/AT, No temporalis wasting. Prominent temp pulse not noted. Ear/Nose/Throat: Hearing grossly intact, nares w/o erythema or drainage, oropharynx w/o Erythema/Exudate Eyes: Sclera non-icteric, conjunctiva clear Neck: Trachea midline.  No JVD.  Pulmonary:  Good air movement, respirations not labored, equal bilaterally.  Cardiac: Tachycardic, normal S1, S2. Vascular: There is gangrene of the left second toe which is mummified.  There are ulcerations of both feet.  There is no odor there is no drainage. Vessel Right Left  Radial Palpable Palpable  PT  not palpable  not palpable  DP  not palpable  not palpable  Gastrointestinal: soft, non-tender/non-distended. No guarding/reflex.  Musculoskeletal: M/S 5/5 throughout.  Extremities without ischemic changes.  No deformity or atrophy. No edema. Neurologic: Sensation grossly intact in extremities.  Symmetrical.  Speech is fluent. Motor exam as listed above. Psychiatric: Judgment intact, Mood & affect appropriate for pt's clinical situation. Dermatologic: No rashes +ulcers noted.  + cellulitis of the left forefoot associated with open wounds and dry gangrene. Lymph : No Cervical, Axillary, or Inguinal lymphadenopathy.      CBC Lab Results  Component Value Date   WBC 6.1 02/23/2017   HGB 12.9 02/23/2017   HCT 37.6 02/23/2017   MCV 111.4 (H) 02/23/2017   PLT 364 02/23/2017    BMET    Component Value Date/Time   NA 143 02/23/2017 0543   K 3.9 02/23/2017 1705   CL 107 02/23/2017 0543   CO2 25  02/23/2017 0543   GLUCOSE 120 (H) 02/23/2017 0543   BUN <5 (L) 02/23/2017 0543   CREATININE 0.49 02/23/2017 0543   CALCIUM 8.4 (L) 02/23/2017 0543   GFRNONAA >60 02/23/2017 0543   GFRAA >60 02/23/2017 0543   Estimated Creatinine Clearance: 39 mL/min (by C-G formula based on SCr of 0.49 mg/dL).  COAG No results found for: INR, PROTIME  Radiology Dg Foot Complete Left  Result Date: 02/19/2017 CLINICAL DATA:  Left second toe gangrene. EXAM: LEFT FOOT - COMPLETE 3+ VIEW COMPARISON:  Left second toe x-rays dated February 12, 2017. FINDINGS: The bones are diffusely osteopenic, which limits evaluation. No definite osteolysis or cortical destruction. No acute fracture or malalignment. Prominent hammertoe deformities of the second through fifth digits. Diffuse soft tissue swelling about the foot. IMPRESSION: No definite radiographic evidence of osteomyelitis. Diffuse soft tissue swelling about the foot heart HO. Electronically Signed   By: Obie Dredge M.D.   On: 02/19/2017 17:03   Dg Toe 2nd Left  Result Date: 02/12/2017 CLINICAL DATA:  Dorsal second toe laceration. Concern for osteomyelitis. EXAM: LEFT SECOND TOE COMPARISON:  None. FINDINGS: The bones are diffusely osteopenic, which limits evaluation. No definite osteolysis or cortical destruction. No acute fracture or malalignment. Degenerative changes of the first MTP joint. Soft tissues are unremarkable. IMPRESSION: Diffuse osteopenia, which limits evaluation. No definite radiographic evidence of osteomyelitis. No acute osseous abnormality. Electronically Signed   By: Obie Dredge M.D.   On: 02/12/2017 17:09      Assessment/Plan 1.  Atherosclerotic occlusive disease bilateral lower extremities associated with mummified left second toe and multiple ulcerations bilaterally of the feet: Given the patient's profound flexion contractures in association with her bedridden status angiography would be virtually impossible.  I would move forward  with local wound care and if her perfusion is inadequate for wound healing then I would  recommend above-knee amputation.  2.  Chronic diastolic congestive heart failure:  Continue cardiac and antihypertensive medications as already ordered and reviewed, no changes at this time.  Continue statin as ordered and reviewed, no changes at this time  Nitrates PRN for chest pain  3.  Diabetes mellitus.:  Hold oral hypoglycemics. Start sliding scale insulin.  4. Dementia: Continue reorientation as best as possible and monitor for inpatient delirium.  5.   Paroxysmal atrial fibrillation: Patient is now been transferred to telemetry she has been initiated on a Cardizem drip.  Continue  metoprolol orally. Hold Eliquis for surgery.   Levora Dredge, MD  02/23/2017 7:18 PM    This note was created with Dragon medical transcription system.  Any error is purely unintentional

## 2017-02-23 NOTE — Progress Notes (Signed)
Spoke with Dr. Orland Jarred to verify that patient's surgery was cancelled. [facility] did cancel surgery until potassium is corrected.

## 2017-02-23 NOTE — Progress Notes (Addendum)
Clinical Child psychotherapist (CSW) received a call from patient's [employer] DSS guardian Joyce Gross. CSW made Joyce Gross aware that plan is for toe amputation Thursday by Dr. Orland Jarred. CSW made RN and Dr. Racheal Patches office aware to contact Ambrose Finland of DSS for consent at [facility] request. CSW will continue to follow and assist as needed.   [employer], LCSW [phone]

## 2017-02-23 NOTE — Progress Notes (Signed)
Notified Dr. Elpidio Anis that central tele called due to patient's heart going up to high as 170's. MD on the floor to review tele readings.

## 2017-02-23 NOTE — Progress Notes (Signed)
Notified Dr. Elpidio Anis that patient's heart rate keeps jumping up and down. MD to place orders for transfer to 2A. MD also made aware of difficulty trying to get patient to dring oral potassium.

## 2017-02-23 NOTE — Progress Notes (Signed)
Pt transferred from 1C via stretcher.  Placed in room and vital signs and assessment done and noted.  Pt lower extremeties are very contracted.  Toes noted to be very dark on left foot.  Tele applied and verified with Central telemetry,.  Cardizum drip started at 1845pm after verification with charge nurse Margaretmary Dys, RN.  Skin noted to be very dry in texture.  Pt inct of urine and bowel.  Pt on a low bed and call bell within reach.  Pt verbalizes very little.

## 2017-02-23 NOTE — Care Management Important Message (Signed)
Important Message  Patient Details  Name: [name] [name] MRN: [id number] Date of Birth: 1929/09/06   Medicare Important Message Given:  Yes    Collie Siad, RN 02/23/2017, 9:50 AM

## 2017-02-24 LAB — CULTURE, BLOOD (ROUTINE X 2)
CULTURE: NO GROWTH
CULTURE: NO GROWTH
Special Requests: ADEQUATE

## 2017-02-24 LAB — BASIC METABOLIC PANEL
ANION GAP: 7 (ref 5–15)
BUN: 6 mg/dL (ref 6–20)
CHLORIDE: 112 mmol/L — AB (ref 101–111)
CO2: 25 mmol/L (ref 22–32)
Calcium: 8.2 mg/dL — ABNORMAL LOW (ref 8.9–10.3)
Creatinine, Ser: 0.47 mg/dL (ref 0.44–1.00)
GFR calc non Af Amer: >60 mL/min (ref >60)
GLUCOSE: 127 mg/dL — AB (ref 65–99)
Potassium: 3.1 mmol/L — ABNORMAL LOW (ref 3.5–5.1)
Sodium: 144 mmol/L (ref 135–145)

## 2017-02-24 LAB — GLUCOSE, CAPILLARY
GLUCOSE-CAPILLARY: 143 mg/dL — AB (ref 65–99)
Glucose-Capillary: 117 mg/dL — ABNORMAL HIGH (ref 65–99)
Glucose-Capillary: 137 mg/dL — ABNORMAL HIGH (ref 65–99)
Glucose-Capillary: 150 mg/dL — ABNORMAL HIGH (ref 65–99)

## 2017-02-24 MED ORDER — METOPROLOL TARTRATE 25 MG PO TABS: 25.0000 mg | ORAL_TABLET | Freq: Two times a day (BID) | ORAL | Status: DC

## 2017-02-24 MED ORDER — METOPROLOL TARTRATE 25 MG PO TABS
25.0000 mg | ORAL_TABLET | Freq: Two times a day (BID) | ORAL | Status: DC
  Administered 2017-02-24: 25 mg via ORAL

## 2017-02-24 MED ORDER — POTASSIUM CHLORIDE CRYS ER 20 MEQ PO TBCR
40.0000 meq | EXTENDED_RELEASE_TABLET | ORAL | Status: AC
  Administered 2017-02-24 (×2): 40 meq via ORAL
  Filled 2017-02-24 (×2): qty 2

## 2017-02-24 MED ORDER — METOPROLOL TARTRATE 25 MG PO TABS
25.0000 mg | ORAL_TABLET | Freq: Two times a day (BID) | ORAL | Status: DC
  Administered 2017-02-26: 25 mg via ORAL
  Filled 2017-02-24 (×5): qty 1

## 2017-02-24 NOTE — Progress Notes (Signed)
[employer] [facility] - [place] at [facility]   PATIENT NAME: [name] [name]    MR#:  [id number]  DATE OF BIRTH:  1929/07/14  SUBJECTIVE:  CHIEF COMPLAINT:   Chief Complaint  Patient presents with  . Foot Pain   Patient is confused.  Poor PO intake.  Unable to give any history  Surgery tomorrow  Cardizem drip stopped  REVIEW OF SYSTEMS:    Review of Systems  Unable to perform ROS: Dementia    DRUG ALLERGIES:  No Known Allergies  VITALS:  Blood pressure 93/74, pulse 87, temperature (!) 97.5 F (36.4 C), temperature source Oral, resp. rate 18, height 5\' 3"  (1.6 m), weight 60.3 kg (133 lb), SpO2 100 %.  PHYSICAL EXAMINATION:   Physical Exam  GENERAL:  83 y.o.-year-old patient lying in the bed with no acute distress.  EYES: Pupils equal, round, reactive to light and accommodation. No scleral icterus. Extraocular muscles intact.  HEENT: Head atraumatic, normocephalic. Oropharynx and nasopharynx clear.  NECK:  Supple, no jugular venous distention. No thyroid enlargement, no tenderness.  LUNGS: Normal breath sounds bilaterally, no wheezing, rales, rhonchi. No use of accessory muscles of respiration.  CARDIOVASCULAR: S1, S2 tachycardia, irregular. No murmurs, rubs, or gallops.  ABDOMEN: Soft, nontender, nondistended. Bowel sounds present. No organomegaly or mass.  EXTREMITIES: No cyanosis, clubbing or edema b/l.    NEUROLOGIC: Moves all 4 extremities  PSYCHIATRIC: The patient is confused SKIN: left second toe is dry, black.  LABORATORY PANEL:   CBC  Recent Labs Lab 02/23/17 0543  WBC 6.1  HGB 12.9  HCT 37.6  PLT 364   ------------------------------------------------------------------------------------------------------------------ Chemistries   Recent Labs Lab 02/23/17 0543  02/24/17 0354  NA 143  --  144  K 2.6*  < > 3.1*  CL 107  --  112*  CO2 25  --  25  GLUCOSE 120*  --  127*  BUN <5*  --  6  CREATININE 0.49  --  0.47  CALCIUM 8.4*  --  8.2*   MG 1.6*  --   --   < > = values in this interval not displayed. ------------------------------------------------------------------------------------------------------------------  Cardiac Enzymes No results for input(s): TROPONINI in the last 168 hours. ------------------------------------------------------------------------------------------------------------------  RADIOLOGY:  No results found.   ASSESSMENT AND PLAN:   * Hypokalemia replace PO  * Left second toe gangrene with cellulitis. Sepsis present on admission.  IV Zosyn.  blood cultures - Negative Consulted podiatry for amputation of the toe.  Surgery tomorrow  * Chronic diastolic congestive heart failure. No signs of fluid overload.  * Diabetes mellitus. oral hypoglycemics stopped. On sliding scale insulin.  * Dementia. Monitor for inpatient delirium.  * Paroxysmal atrial fibrillation. Started Cardizem. DC metoprolol due to low normal blood pressure Held Eliquis for surgery. Today has RVR due to sepsis  * DVT prophylaxis with Lovenox.  All the records are reviewed and case discussed with Care Management/Social Workerr. Management plans discussed with the patient, family and they are in agreement.  CODE STATUS: DNR  TOTAL TIME TAKING CARE OF THIS PATIENT:30 minutes.   POSSIBLE D/C IN 2-3 DAYS, DEPENDING ON CLINICAL CONDITION.  02/26/17 R M.D on 02/24/2017 at 10:07 AM  Between 7am to 6pm - Pager - [phone]  After 6pm go to [email or web address] - password [email or web address] [facility]  [facility] [place] [facility]  Office  [phone]  CC: Primary care physician; [phone], MD  Note: This dictation was prepared with Dragon dictation along with smaller phrase technology. Any transcriptional errors  that result from this process are unintentional.

## 2017-02-24 NOTE — Progress Notes (Signed)
Patient seen and assessed  HE into 150s at times. Patient confused and unable to elicit history.  On physical examination patientis laying in bed confused Heart rate tachycardic and irregularly irregular Lungs clear No lower extremity edema  * atrial fibrillation with rapid ventricular rate. Patient on oral Cardizem. Metoprolol held due tolow blood pressure. At this time we'll bolus normal saline. Also gave extra dose of Cardizem.No response. Patient was given IV digoxin 250 mg. Next dose due in 6 hours. Patient continued to be tachycardic into the 150s with low normal blood pressure. Transferred to telemetry floor. Start on Cardizem drip.  Critical care time spent 35 minutes

## 2017-02-25 ENCOUNTER — Inpatient Hospital Stay: Payer: Medicare Other | Admitting: Anesthesiology

## 2017-02-25 ENCOUNTER — Encounter
Admission: EM | Disposition: A | Discharge status: Home / Self Care | Payer: Self-pay | Source: Home / Self Care | Attending: Internal Medicine

## 2017-02-25 ENCOUNTER — Encounter: Payer: Self-pay | Admitting: *Deleted

## 2017-02-25 HISTORY — PX: AMPUTATION TOE: SHX6595

## 2017-02-25 LAB — GLUCOSE, CAPILLARY
Glucose-Capillary: 128 mg/dL — ABNORMAL HIGH (ref 65–99)
Glucose-Capillary: 117 mg/dL — ABNORMAL HIGH (ref 65–99)
Glucose-Capillary: 121 mg/dL — ABNORMAL HIGH (ref 65–99)
Glucose-Capillary: 143 mg/dL — ABNORMAL HIGH (ref 65–99)
Glucose-Capillary: 189 mg/dL — ABNORMAL HIGH (ref 65–99)

## 2017-02-25 LAB — BASIC METABOLIC PANEL
Anion gap: 7 (ref 5–15)
BUN: 5 mg/dL — AB (ref 6–20)
CHLORIDE: 115 mmol/L — AB (ref 101–111)
CO2: 24 mmol/L (ref 22–32)
CREATININE: 0.5 mg/dL (ref 0.44–1.00)
Calcium: 8.3 mg/dL — ABNORMAL LOW (ref 8.9–10.3)
GFR calc Af Amer: >60 mL/min (ref >60)
GFR calc non Af Amer: >60 mL/min (ref >60)
Glucose, Bld: 129 mg/dL — ABNORMAL HIGH (ref 65–99)
POTASSIUM: 4.6 mmol/L (ref 3.5–5.1)
Sodium: 146 mmol/L — ABNORMAL HIGH (ref 135–145)

## 2017-02-25 SURGERY — AMPUTATION, TOE
Anesthesia: General | Laterality: Left | Wound class: Dirty or Infected

## 2017-02-25 MED ORDER — MEPERIDINE HCL 50 MG/ML IJ SOLN: 6.2500 mg | INTRAMUSCULAR | Status: DC | PRN

## 2017-02-25 MED ORDER — PHENYLEPHRINE HCL 10 MG/ML IJ SOLN
INTRAMUSCULAR | Status: DC | PRN
  Administered 2017-02-25 (×4): 100 ug via INTRAVENOUS

## 2017-02-25 MED ORDER — FENTANYL CITRATE (PF) 100 MCG/2ML IJ SOLN: 25.0000 ug | INTRAMUSCULAR | Status: DC | PRN

## 2017-02-25 MED ORDER — DEXAMETHASONE SODIUM PHOSPHATE 10 MG/ML IJ SOLN
INTRAMUSCULAR | Status: DC | PRN
  Administered 2017-02-25: 5 mg via INTRAVENOUS

## 2017-02-25 MED ORDER — LIDOCAINE HCL (CARDIAC) 20 MG/ML IV SOLN
INTRAVENOUS | Status: DC | PRN
  Administered 2017-02-25: 20 mg via INTRAVENOUS

## 2017-02-25 MED ORDER — MIDAZOLAM HCL 2 MG/2ML IJ SOLN
INTRAMUSCULAR | Status: AC
  Filled 2017-02-25: qty 2

## 2017-02-25 MED ORDER — FENTANYL CITRATE (PF) 100 MCG/2ML IJ SOLN
INTRAMUSCULAR | Status: AC
  Filled 2017-02-25: qty 2

## 2017-02-25 MED ORDER — BUPIVACAINE HCL 0.5 % IJ SOLN
INTRAMUSCULAR | Status: DC | PRN
  Administered 2017-02-25: 2.5 mL

## 2017-02-25 MED ORDER — CHLORHEXIDINE GLUCONATE CLOTH 2 % EX PADS
6.0000 | MEDICATED_PAD | Freq: Once | CUTANEOUS | Status: AC
  Administered 2017-02-25: 6 via TOPICAL

## 2017-02-25 MED ORDER — SODIUM CHLORIDE 0.45 % IV SOLN
INTRAVENOUS | Status: AC
  Administered 2017-02-25: 10:00:00 via INTRAVENOUS

## 2017-02-25 MED ORDER — PROMETHAZINE HCL 25 MG/ML IJ SOLN: 6.2500 mg | INTRAMUSCULAR | Status: DC | PRN

## 2017-02-25 MED ORDER — SEVOFLURANE IN SOLN
RESPIRATORY_TRACT | Status: AC
  Filled 2017-02-25: qty 250

## 2017-02-25 MED ORDER — ESMOLOL HCL 100 MG/10ML IV SOLN
INTRAVENOUS | Status: DC | PRN
  Administered 2017-02-25: 10 mg via INTRAVENOUS

## 2017-02-25 MED ORDER — PROPOFOL 10 MG/ML IV BOLUS
INTRAVENOUS | Status: DC | PRN
  Administered 2017-02-25: 60 mg via INTRAVENOUS

## 2017-02-25 MED ORDER — LIDOCAINE HCL 1 % IJ SOLN
INTRAMUSCULAR | Status: DC | PRN
  Administered 2017-02-25: 2.5 mL

## 2017-02-25 SURGICAL SUPPLY — 37 items
BANDAGE ELASTIC 4 LF NS (GAUZE/BANDAGES/DRESSINGS) ×3 IMPLANT
BANDAGE STRETCH 3X4.1 STRL (GAUZE/BANDAGES/DRESSINGS) ×3 IMPLANT
BLADE MED AGGRESSIVE (BLADE) ×3 IMPLANT
BLADE SURG 15 STRL LF DISP TIS (BLADE) ×4 IMPLANT
BLADE SURG 15 STRL SS (BLADE) ×8
BNDG ESMARK 4X12 TAN STRL LF (GAUZE/BANDAGES/DRESSINGS) ×3 IMPLANT
BNDG GAUZE 4.5X4.1 6PLY STRL (MISCELLANEOUS) ×3 IMPLANT
CANISTER SUCT 1200ML W/VALVE (MISCELLANEOUS) ×3 IMPLANT
CUFF TOURN 18 STER (MISCELLANEOUS) ×3 IMPLANT
CUFF TOURN DUAL PL 12 NO SLV (MISCELLANEOUS) ×3 IMPLANT
DRAPE FLUOR MINI C-ARM 54X84 (DRAPES) ×3 IMPLANT
DURAPREP 26ML APPLICATOR (WOUND CARE) ×3 IMPLANT
ELECT REM PT RETURN 9FT ADLT (ELECTROSURGICAL) ×3
ELECTRODE REM PT RTRN 9FT ADLT (ELECTROSURGICAL) ×1 IMPLANT
GAUZE PETRO XEROFOAM 1X8 (MISCELLANEOUS) ×3 IMPLANT
GAUZE SPONGE 4X4 12PLY STRL (GAUZE/BANDAGES/DRESSINGS) ×3 IMPLANT
GLOVE BIO SURGEON STRL SZ7 (GLOVE) ×6 IMPLANT
GLOVE BIO SURGEON STRL SZ8 (GLOVE) ×3 IMPLANT
GLOVE BIOGEL PI IND STRL 7.5 (GLOVE) ×2 IMPLANT
GLOVE BIOGEL PI INDICATOR 7.5 (GLOVE) ×4
GLOVE INDICATOR 8.0 STRL GRN (GLOVE) ×3 IMPLANT
GOWN STRL REUS W/ TWL LRG LVL3 (GOWN DISPOSABLE) ×3 IMPLANT
GOWN STRL REUS W/TWL LRG LVL3 (GOWN DISPOSABLE) ×6
KIT RM TURNOVER STRD PROC AR (KITS) ×3 IMPLANT
LABEL OR SOLS (LABEL) ×3 IMPLANT
NDL SAFETY 18GX1.5 (NEEDLE) ×3 IMPLANT
NEEDLE HYPO 25X1 1.5 SAFETY (NEEDLE) ×9 IMPLANT
NS IRRIG 500ML POUR BTL (IV SOLUTION) ×3 IMPLANT
PACK EXTREMITY ARMC (MISCELLANEOUS) ×3 IMPLANT
PENCIL ELECTRO HAND CTR (MISCELLANEOUS) ×3 IMPLANT
RASP SM TEAR CROSS CUT (RASP) ×3 IMPLANT
STOCKINETTE STRL 6IN 960660 (GAUZE/BANDAGES/DRESSINGS) ×3 IMPLANT
SUT ETH BLK MONO 3 0 FS 1 12/B (SUTURE) ×3 IMPLANT
SUT ETHILON 5 0 PS 2 18 (SUTURE) ×3 IMPLANT
SUT VIC AB 4-0 FS2 27 (SUTURE) ×3 IMPLANT
SWAB CULTURE AMIES ANAERIB BLU (MISCELLANEOUS) IMPLANT
SYRINGE 10CC LL (SYRINGE) ×3 IMPLANT

## 2017-02-25 NOTE — Progress Notes (Signed)
PT Cancellation Note  Patient Details Name: ILIANI VEJAR MRN: [id number] DOB: 11-Oct-1929   Cancelled Treatment:    Reason Eval/Treat Not Completed: Other (comment).  PT consult received.  Chart reviewed.  Contacted [name] and spoke with [place] from this facility who reported pt does not walk; is a 2 person "lift" to w/c (pt minimally assists on a good day d/t dementia); and gets pushed in w/c (but can propel self on a "good day").  Pt requires assist with bathing, meals, dressing (is total care).  Pt is not receiving any therapy services at facility.  Per Podiatry note 02/21/17 pt has severe contractures of the LE's at the hip and knees; unable to straighten either leg.  Pt currently does not appear appropriate for acute care PT.  CM notified who reported she would relay information to SW.  Will discontinue current PT order.  Hendricks Limes, PT 02/25/17, 2:27 PM [phone]

## 2017-02-25 NOTE — Transfer of Care (Signed)
Immediate Anesthesia Transfer of Care Note  Patient: [name] [name]  Procedure(s) Performed: AMPUTATION TOE-LEFT 2ND TOE (Left )  Patient Location: PACU  Anesthesia Type:General  Level of Consciousness: confused  Airway & Oxygen Therapy: Patient Spontanous Breathing and Patient connected to face mask oxygen  Post-op Assessment: Report given to RN  Post vital signs: Reviewed and stable  Last Vitals:  Vitals:   02/25/17 1329 02/25/17 1436  BP: 91/62 94/80  Pulse:  (!) 39  Resp: 16 (!) 31  Temp: (!) 36.1 C (!) 36.3 C  SpO2:  99%    Last Pain:  Vitals:   02/25/17 1329  TempSrc: Temporal  PainSc:          Complications: No apparent anesthesia complications

## 2017-02-25 NOTE — Anesthesia Preprocedure Evaluation (Addendum)
Anesthesia Evaluation  Patient identified by MRN, date of birth, ID band Patient awake    Reviewed: Allergy & Precautions, NPO status , Patient's Chart, lab work & pertinent test results  History of Anesthesia Complications Negative for: history of anesthetic complications  Airway       Comment: Unable to assess, pt uncooperative Dental  (+) Missing   Pulmonary neg sleep apnea, COPD, former smoker,           Cardiovascular hypertension, + CAD and +CHF (preserved EF)  (-) Past MI and (-) Cardiac Stents + dysrhythmias Atrial Fibrillation   Echo 12/09/15: - Left ventricle: The cavity size was normal. Systolic function was   normal. The estimated ejection fraction was in the range of 55%   to 60%. Wall motion was normal; there were no regional wall   motion abnormalities. The study is not technically sufficient to   allow evaluation of LV diastolic function. - Mitral valve: There was mild regurgitation. - Left atrium: The atrium was mildly dilated. - Right ventricle: Systolic function was normal. - Tricuspid valve: There was mild-moderate regurgitation. - Pulmonary arteries: Systolic pressure was mild to moderately   elevated. PA peak pressure: 49 mm Hg (S).   Neuro/Psych dementia    GI/Hepatic negative GI ROS, Neg liver ROS,   Endo/Other  diabetes, Oral Hypoglycemic Agents  Renal/GU negative Renal ROS     Musculoskeletal negative musculoskeletal ROS (+)   Abdominal (+) - obese,   Peds  Hematology negative hematology ROS (+)   Anesthesia Other Findings Past Medical History: No date: A-fib (HCC) No date: Allergic rhinitis No date: Alzheimer disease No date: CAD (coronary artery disease) No date: Cardiomyopathy (HCC) No date: CHF (congestive heart failure) (HCC) No date: COPD (chronic obstructive pulmonary disease) (HCC) No date: Dementia No date: Diabetes mellitus without complication (HCC) No date:  Diverticulosis No date: Eczema No date: Hyperlipemia No date: Hypertension No date: Urinary incontinence   Reproductive/Obstetrics                             Anesthesia Physical Anesthesia Plan  ASA: III  Anesthesia Plan: General   Post-op Pain Management:    Induction: Intravenous  PONV Risk Score and Plan: 2 and Ondansetron and Dexamethasone  Airway Management Planned: LMA  Additional Equipment:   Intra-op Plan:   Post-operative Plan:   Informed Consent: I have reviewed the patients History and Physical, chart, labs and discussed the procedure including the risks, benefits and alternatives for the proposed anesthesia with the patient or authorized representative who has indicated his/her understanding and acceptance.   Dental advisory given  Plan Discussed with: CRNA and Anesthesiologist  Anesthesia Plan Comments: (Consent obtained from legal guardian via phone, Herbie Saxon Pt has DNR order, we discussed plan for suspension of DNR order during periop period due to possible need for respiratory support, BP support and potential reactions to medications during the procedure. )      Anesthesia Quick Evaluation

## 2017-02-25 NOTE — Progress Notes (Signed)
Spoke with dr. Elpidio Anis to make aware patient is unable to take po medications due to loc. Current hr 90-110 . Per md okay to hold po meds at this time,  restart dys 1 diet. Will continue to monitor

## 2017-02-25 NOTE — Progress Notes (Signed)
[facility] - [place] at [facility]   PATIENT NAME: [name] [name]    MR#:  [id number]  DATE OF BIRTH:  02-23-1930  SUBJECTIVE:  CHIEF COMPLAINT:   Chief Complaint  Patient presents with  . Foot Pain   Confused. Wakes up calling her name.  HR improved  REVIEW OF SYSTEMS:    Review of Systems  Unable to perform ROS: Dementia    DRUG ALLERGIES:  No Known Allergies  VITALS:  Blood pressure 94/65, pulse 98, temperature (!) 100.4 F (38 C), temperature source Oral, resp. rate 18, height 5\' 3"  (1.6 m), weight 54 kg (119 lb), SpO2 99 %.  PHYSICAL EXAMINATION:   Physical Exam  GENERAL:  83 y.o.-year-old patient lying in the bed with no acute distress.  EYES: Pupils equal, round, reactive to light and accommodation. No scleral icterus. Extraocular muscles intact.  HEENT: Head atraumatic, normocephalic. Oropharynx and nasopharynx clear.  NECK:  Supple, no jugular venous distention. No thyroid enlargement, no tenderness.  LUNGS: Normal breath sounds bilaterally, no wheezing, rales, rhonchi. No use of accessory muscles of respiration.  CARDIOVASCULAR: S1, S2 tachycardia, irregular. No murmurs, rubs, or gallops.  ABDOMEN: Soft, nontender, nondistended. Bowel sounds present. No organomegaly or mass.  EXTREMITIES: No cyanosis, clubbing or edema b/l.    NEUROLOGIC: Moves all 4 extremities  PSYCHIATRIC: The patient is confused SKIN: left second toe is dry, black.  LABORATORY PANEL:   CBC  Recent Labs Lab 02/23/17 0543  WBC 6.1  HGB 12.9  HCT 37.6  PLT 364   ------------------------------------------------------------------------------------------------------------------ Chemistries   Recent Labs Lab 02/23/17 0543  02/25/17 0410  NA 143  < > 146*  K 2.6*  < > 4.6  CL 107  < > 115*  CO2 25  < > 24  GLUCOSE 120*  < > 129*  BUN <5*  < > 5*  CREATININE 0.49  < > 0.50  CALCIUM 8.4*  < > 8.3*  MG 1.6*  --   --   < > = values in this interval not  displayed. ------------------------------------------------------------------------------------------------------------------  Cardiac Enzymes No results for input(s): TROPONINI in the last 168 hours. ------------------------------------------------------------------------------------------------------------------  RADIOLOGY:  No results found.   ASSESSMENT AND PLAN:   * Paroxysmal atrial fibrillation. On cardizem/Lopressor and Digoxin. Improved. Tele monitoring Eliquis held for surgery  With HR improved. Can proceed with surgery  * Hypokalemia- resolved replaced PO  * Left second toe gangrene with cellulitis. Sepsis present on admission.  IV Zosyn.  blood cultures - Negative Consulted podiatry for amputation of the toe.  Surgery today  * Chronic diastolic congestive heart failure. No signs of fluid overload.  * Diabetes mellitus. oral hypoglycemics stopped. On sliding scale insulin.  * Dementia. Monitor for inpatient delirium.  * DVT prophylaxis with Lovenox.  All the records are reviewed and case discussed with Care Management/Social Workerr. Management plans discussed with the patient, family and they are in agreement.  CODE STATUS: DNR  TOTAL TIME TAKING CARE OF THIS PATIENT:30 minutes.   POSSIBLE D/C IN 2-3 DAYS, DEPENDING ON CLINICAL CONDITION.  02/27/17 R M.D on 02/25/2017 at 8:59 AM  Between 7am to 6pm - Pager - [phone]  After 6pm go to [email or web address] - password [email or web address] [facility]  [facility] [place] [facility]  Office  [phone]  CC: Primary care physician; [phone], MD  Note: This dictation was prepared with Dragon dictation along with smaller phrase technology. Any transcriptional errors that result from this process are unintentional.

## 2017-02-25 NOTE — Progress Notes (Signed)
Clinical Child psychotherapist (CSW) updated patient's DSS guardian Lazaro Arms that patient is now in room 258 and made her aware of the CSW following on that floor. Per chart patient will have surgery today.  [employer], LCSW [phone]

## 2017-02-25 NOTE — Anesthesia Post-op Follow-up Note (Signed)
Anesthesia QCDR form completed.        

## 2017-02-25 NOTE — Op Note (Signed)
Operative note   Surgeon: Dr. Recardo Evangelist, DPM.    Assistant:none    Preop diagnosis: Gangrene second toe left foot    Postop diagnosis: Same    Procedure:   1. Amputation second toe left foot to the MTPJ level           EBL: 5 cc    Anesthesia:general delivered by anesthesia team local anesthesia block delivered by me including 5 cc lidocaine Marcaine mix preoperatively.    Hemostasis: None    Specimen: Gangrenous second toe left foot    Complications: None    Operative indications: Gangrene second toe left foot    Procedure:  Patient was brought into the OR and placed on the operating table in thesupine position. After anesthesia was obtained theleft lower extremity was prepped and draped in usual sterile fashion.  Operative Report: At this time to his directed to the second toe of the left foot where 2 similar incisions were made over the digit. Incision was carried forth into healthy skin and was carried down to the deep tissues along both medial and lateral aspects of the toe. This point that the blade was used to release soft tissue including tendon and joint structures capsular tissues from the dorsal medial lateral plantar aspects of the MTPJ. The toe was then removed and sent to pathology for evaluation. There is evaluated for any bleeders only capillary bleeding was noted. Flow was encountered. After copious irrigation saline 3 4 Vicryl simple sutures placed across wound followed by skin closure with 3-0 nylon simple interrupted sutures. A sterile compressive dressings were placed across wound consisting of Xeroform gauze 4 x 4's Kling and Kerlix.    Patient tolerated the procedure and anesthesia well.  Was transported from the OR to the PACU with all vital signs stable and vascular status intact. To be discharged per routine protocol.  Will follow up in approximately 1 week in the outpatient clinic.

## 2017-02-25 NOTE — Anesthesia Postprocedure Evaluation (Signed)
Anesthesia Post Note  Patient: [name] [name]  Procedure(s) Performed: AMPUTATION TOE-LEFT 2ND TOE (Left )  Patient location during evaluation: PACU Anesthesia Type: General Level of consciousness: awake and alert Pain management: pain level controlled Vital Signs Assessment: post-procedure vital signs reviewed and stable Respiratory status: spontaneous breathing, nonlabored ventilation and respiratory function stable Cardiovascular status: blood pressure returned to baseline and stable Postop Assessment: no signs of nausea or vomiting Anesthetic complications: no     Last Vitals:  Vitals:   02/25/17 1453 02/25/17 1502  BP: 92/64   Pulse: 92 (!) 103  Resp: 20 (!) 24  Temp:    SpO2: 100% 100%    Last Pain:  Vitals:   02/25/17 1329  TempSrc: Temporal  PainSc:                  [name]

## 2017-02-25 NOTE — H&P (Signed)
H and P has been reviewed and no changes are noted. The potassium levels have improved.

## 2017-02-26 ENCOUNTER — Encounter: Payer: Self-pay | Admitting: Podiatry

## 2017-02-26 LAB — CREATININE, SERUM
Creatinine, Ser: 0.77 mg/dL (ref 0.44–1.00)
GFR calc Af Amer: >60 mL/min (ref >60)
GFR calc non Af Amer: >60 mL/min (ref >60)

## 2017-02-26 LAB — GLUCOSE, CAPILLARY
GLUCOSE-CAPILLARY: 169 mg/dL — AB (ref 65–99)
Glucose-Capillary: 218 mg/dL — ABNORMAL HIGH (ref 65–99)
Glucose-Capillary: 249 mg/dL — ABNORMAL HIGH (ref 65–99)
Glucose-Capillary: 183 mg/dL — ABNORMAL HIGH (ref 65–99)

## 2017-02-26 MED ORDER — INSULIN ASPART 100 UNIT/ML ~~LOC~~ SOLN
0.0000 [IU] | Freq: Three times a day (TID) | SUBCUTANEOUS | Status: DC
  Administered 2017-02-26: 2 [IU] via SUBCUTANEOUS
  Administered 2017-02-27 (×2): 1 [IU] via SUBCUTANEOUS
  Administered 2017-02-28 (×2): 2 [IU] via SUBCUTANEOUS
  Administered 2017-03-01: 1 [IU] via SUBCUTANEOUS
  Administered 2017-03-01: 2 [IU] via SUBCUTANEOUS
  Administered 2017-03-01: 3 [IU] via SUBCUTANEOUS
  Administered 2017-03-02: 1 [IU] via SUBCUTANEOUS
  Administered 2017-03-02: 2 [IU] via SUBCUTANEOUS
  Administered 2017-03-03: 3 [IU] via SUBCUTANEOUS
  Administered 2017-03-03: 1 [IU] via SUBCUTANEOUS
  Filled 2017-02-26 (×12): qty 1

## 2017-02-26 MED ORDER — AMOXICILLIN-POT CLAVULANATE 250-62.5 MG/5ML PO SUSR: 500.0000 mg | Freq: Two times a day (BID) | ORAL | 0 refills | Status: DC

## 2017-02-26 MED ORDER — DIGOXIN 125 MCG PO TABS: 0.1250 mg | ORAL_TABLET | Freq: Every day | ORAL | 0 refills | Status: DC

## 2017-02-26 MED ORDER — APIXABAN 5 MG PO TABS
5.0000 mg | ORAL_TABLET | Freq: Two times a day (BID) | ORAL | Status: DC
  Administered 2017-02-26 – 2017-03-03 (×10): 5 mg via ORAL
  Filled 2017-02-26 (×10): qty 1

## 2017-02-26 NOTE — Clinical Social Work Note (Signed)
CSW contacted patient's legal guardian [name] [name], [phone] with an update on patient's progress.  CSW also spoke to [name], and they said if patient is medically ready for discharge over the weekend they can accept her.  [name] [name] said to contact [facility] the Production designer, theatre/television/film at [phone] if patient is ready for discharge.  CSW to continue to follow patient's progress throughout discharge planning.  [name] [name], MSW, Theresia Majors [phone]  02/26/2017 7:05 PM

## 2017-02-26 NOTE — Progress Notes (Signed)
Patient Demographics  [name] [name], is a 83 y.o. female   MRN: [id number]   DOB - Jan 15, 1930  Admit Date - 02/19/2017    Outpatient Primary MD for the patient is Dasanayaka, Newton Pigg, MD  Consult requested in the Hospital by Milagros Loll, MD, On 02/26/2017   With History of -  Past Medical History:  Diagnosis Date  . A-fib (HCC)   . Allergic rhinitis   . Alzheimer disease   . CAD (coronary artery disease)   . Cardiomyopathy (HCC)   . CHF (congestive heart failure) (HCC)   . COPD (chronic obstructive pulmonary disease) (HCC)   . Dementia   . Diabetes mellitus without complication (HCC)   . Diverticulosis   . Eczema   . Hyperlipemia   . Hypertension   . Urinary incontinence       Past Surgical History:  Procedure Laterality Date  . AMPUTATION TOE Left 02/25/2017   Procedure: AMPUTATION TOE-LEFT 2ND TOE;  Surgeon: Recardo Evangelist, DPM;  Location: [facility] ORS;  Service: Podiatry;  Laterality: Left;  . HYSTEROTOMY    . none    . PACEMAKER PLACEMENT      in for   Chief Complaint  Patient presents with  . Foot Pain     HPI  [name] [name]  is a 83 y.o. female, 1 day sp amputation of left 2nd toe due to gangrene    Review of Systems    In addition to the HPI above,  No Fever-chills, No Headache, No changes with Vision or hearing, No problems swallowing food or Liquids, No Chest pain, Cough or Shortness of Breath, No Abdominal pain, No Nausea or Vommitting, Bowel movements are regular, No Blood in stool or Urine, No dysuria, No new skin rashes or bruises, No new joints pains-aches,  No new weakness, tingling, numbness in any extremity, No recent weight gain or loss, No polyuria, polydypsia or polyphagia, No significant Mental Stressors.  A full 10 point Review of Systems was done, except as stated above, all other Review of  Systems were negative.   Social History Social History  Substance Use Topics  . Smoking status: Former Games developer  . Smokeless tobacco: Former Neurosurgeon  . Alcohol use No     Family History Family History  Problem Relation Age of Onset  . Hypertension Father      Prior to Admission medications   Medication Sig Start Date End Date Taking? Authorizing Provider  albuterol (PROVENTIL HFA;VENTOLIN HFA) 108 (90 Base) MCG/ACT inhaler Inhale 2 puffs into the lungs every 6 (six) hours as needed for wheezing or shortness of breath.   Yes [provider]  amLODipine (NORVASC) 5 MG tablet Take 5 mg by mouth daily.   Yes [provider]  apixaban (ELIQUIS) 5 MG TABS tablet Take 1 tablet (5 mg total) by mouth 2 (two) times daily. 12/11/15  Yes Delfino Lovett, MD  diclofenac sodium (VOLTAREN) 1 % GEL Apply 2 g topically 2 (two) times daily.   Yes [provider]  diltiazem (CARDIZEM) 60 MG tablet Take 1 tablet (60 mg total) by mouth 2 (two) times daily. 12/11/15  Yes Delfino Lovett, MD  fexofenadine (ALLEGRA) 180 MG tablet Take 180 mg by mouth daily.   Yes [provider]  fluticasone (FLONASE) 50 MCG/ACT nasal spray Place 1 spray into both nostrils daily.   Yes [provider]  furosemide (LASIX) 40 MG tablet Take 40 mg by mouth daily.   Yes [provider]  glipiZIDE (GLUCOTROL) 5 MG tablet Take 5 mg by mouth daily.    Yes [provider]  metFORMIN (GLUCOPHAGE) 1000 MG tablet Take 1,000 mg by mouth 2 (two) times daily with a meal.   Yes [provider]  metoprolol tartrate (LOPRESSOR) 25 mg/10 mL SUSP Take 50 mg by mouth 2 (two) times daily.   Yes [provider]  mirtazapine (REMERON) 7.5 MG tablet Take 7.5 mg by mouth at bedtime.   Yes [provider]  mupirocin ointment (BACTROBAN) 2 % Apply to affected area 3 times daily 02/12/17 02/12/18 Yes Emily Filbert, MD  potassium chloride 20 MEQ/15ML (10%) SOLN Take 20 mEq  by mouth daily.   Yes [provider]  acetaminophen (TYLENOL) 500 MG tablet Take 1,000 mg by mouth 3 (three) times daily. Take for arthritis pain.    [provider]  ipratropium (ATROVENT) 0.02 % nebulizer solution Take 0.5 mg by nebulization daily.    [provider]  montelukast (SINGULAIR) 10 MG tablet Take 10 mg by mouth every morning.     [provider]  omeprazole (PRILOSEC) 20 MG capsule Take 20 mg by mouth 2 (two) times daily before a meal. Take 30 minutes before breakfast.    [provider]  polyethylene glycol (MIRALAX / GLYCOLAX) packet Take 17 g by mouth every other day.    [provider]    Anti-infectives    Start     Dose/Rate Route Frequency Ordered Stop   02/20/17 0100  piperacillin-tazobactam (ZOSYN) IVPB 3.375 g     3.375 g 12.5 mL/hr over 240 Minutes Intravenous Every 8 hours 02/19/17 2011     02/19/17 1914  piperacillin-tazobactam (ZOSYN) 3-0.375 GM/50ML IVPB    Comments:  Luretha Murphy   : cabinet override      02/19/17 1914 02/20/17 0729   02/19/17 1900  piperacillin-tazobactam (ZOSYN) IVPB 3.375 g     3.375 g 100 mL/hr over 30 Minutes Intravenous  Once 02/19/17 1845 02/21/17 0124      Scheduled Meds: . digoxin  0.125 mg Oral Daily  . diltiazem  60 mg Oral Q12H  . docusate sodium  100 mg Oral BID  . enoxaparin (LOVENOX) injection  40 mg Subcutaneous Q24H  . feeding supplement (ENSURE ENLIVE)  237 mL Oral TID BM  . loratadine  10 mg Oral Daily  . magnesium oxide  400 mg Oral Daily  . metoprolol tartrate  25 mg Oral BID  . mirtazapine  7.5 mg Oral QHS  . multivitamin with minerals  1 tablet Oral Daily  . potassium chloride  20 mEq Oral Daily   Continuous Infusions: . diltiazem (CARDIZEM) infusion Stopped (02/23/17 2315)  . piperacillin-tazobactam (ZOSYN)  IV 3.375 g (02/26/17 0950)   PRN Meds:.acetaminophen **OR** acetaminophen, albuterol, bisacodyl, ondansetron **OR** ondansetron (ZOFRAN) IV,  polyethylene glycol, traMADol  No Known Allergies  Physical Exam  Vitals  Blood pressure 99/67, pulse 94, temperature 98 F (36.7 C), temperature source Axillary, resp. rate 14, height 5\' 3"  (1.6 m),  weight 60.8 kg (134 lb), SpO2 93 %.  Lower Extremity exam:Dressing intact and stable.  VSS.    Data Review  CBC  Recent Labs Lab 02/19/17 1641 02/20/17 0551 02/23/17 0543  WBC 7.3 6.3 6.1  HGB 12.5 11.3* 12.9  HCT 36.0 32.5* 37.6  PLT 422 340 364  MCV 109.4* 110.2* 111.4*  MCH 37.9* 38.2* 38.1*  MCHC 34.7 34.7 34.2  RDW 15.4* 14.9* 15.0*  LYMPHSABS 2.0  --  1.0  MONOABS 0.7  --  0.6  EOSABS 0.0  --  0.0  BASOSABS 0.1  --  0.0   ------------------------------------------------------------------------------------------------------------------  Chemistries   Recent Labs Lab 02/20/17 0551 02/23/17 0543 02/23/17 1705 02/24/17 0354 02/25/17 0410 02/26/17 0345  NA 137 143  --  144 146*  --   K 3.2* 2.6* 3.9 3.1* 4.6  --   CL 107 107  --  112* 115*  --   CO2 21* 25  --  25 24  --   GLUCOSE 101* 120*  --  127* 129*  --   BUN 9 <5*  --  6 5*  --   CREATININE 0.52 0.49  --  0.47 0.50 0.77  CALCIUM 7.9* 8.4*  --  8.2* 8.3*  --   MG  --  1.6*  --   --   --   --    ----------------------------------------------------------------------------------- Assessment & Plan:  Pt appears to be stable .  She does not communicate.  Will leave dressing intact today and change tomorrow.  Active Problems:   Gangrene of toe of left foot (HCC)   Pressure injury of skin Blythe Veach G M.D on 02/26/2017 at 10:38 AM  Thank you for the consult, we will follow the patient with you in the Hospital.

## 2017-02-26 NOTE — Progress Notes (Signed)
[employer] [facility] - [place] at [facility]   PATIENT NAME: [name] [name]    MR#:  [id number]  DATE OF BIRTH:  [birth year]  SUBJECTIVE:  CHIEF COMPLAINT:   Chief Complaint  Patient presents with  . Foot Pain   Confused. Wakes up calling her name. HR improved.  Doesn't seem to be in pain  REVIEW OF SYSTEMS:    Review of Systems  Unable to perform ROS: Dementia    DRUG ALLERGIES:  No Known Allergies  VITALS:  Blood pressure 99/67, pulse 94, temperature 98 F (36.7 C), temperature source Axillary, resp. rate 14, height 5\' 3"  (1.6 m), weight 60.8 kg (134 lb), SpO2 93 %.  PHYSICAL EXAMINATION:   Physical Exam  GENERAL:  83 y.o.-year-old patient lying in the bed with no acute distress.  EYES: Pupils equal, round, reactive to light and accommodation. No scleral icterus. Extraocular muscles intact.  HEENT: Head atraumatic, normocephalic. Oropharynx and nasopharynx clear.  NECK:  Supple, no jugular venous distention. No thyroid enlargement, no tenderness.  LUNGS: Normal breath sounds bilaterally, no wheezing, rales, rhonchi. No use of accessory muscles of respiration.  CARDIOVASCULAR: S1, S2 tachycardia, irregular. No murmurs, rubs, or gallops.  ABDOMEN: Soft, nontender, nondistended. Bowel sounds present. No organomegaly or mass.  EXTREMITIES: No cyanosis, clubbing or edema b/l.    NEUROLOGIC: Moves all 4 extremities  PSYCHIATRIC: The patient is confused SKIN: Left foot dressing  LABORATORY PANEL:   CBC  Recent Labs Lab 02/23/17 0543  WBC 6.1  HGB 12.9  HCT 37.6  PLT 364   ------------------------------------------------------------------------------------------------------------------ Chemistries   Recent Labs Lab 02/23/17 0543  02/25/17 0410 02/26/17 0345  NA 143  < > 146*  --   K 2.6*  < > 4.6  --   CL 107  < > 115*  --   CO2 25  < > 24  --   GLUCOSE 120*  < > 129*  --   BUN <5*  < > 5*  --   CREATININE 0.49  < > 0.50 0.77  CALCIUM 8.4*  < >  8.3*  --   MG 1.6*  --   --   --   < > = values in this interval not displayed. ------------------------------------------------------------------------------------------------------------------  Cardiac Enzymes No results for input(s): TROPONINI in the last 168 hours. ------------------------------------------------------------------------------------------------------------------  RADIOLOGY:  No results found.   ASSESSMENT AND PLAN:   * Paroxysmal atrial fibrillation. On cardizem/Lopressor and Digoxin. Improved. Tele monitoring Eliquis held for surgery. Will restart today.  With HR improved. Can proceed with surgery  * Hypokalemia- resolved  * Left second toe gangrene with cellulitis. Sepsis present on admission.  IV Zosyn.  blood cultures - Negative Consulted podiatry for amputation of the toe.  S/p amputation. POD - 1  * Chronic diastolic congestive heart failure. No signs of fluid overload.  * Diabetes mellitus. oral hypoglycemics stopped. On sliding scale insulin.  * Dementia. Monitor for inpatient delirium.  * DVT prophylaxis . On eliquis  All the records are reviewed and case discussed with Care Management/Social Workerr. Management plans discussed with the patient, family and they are in agreement.  CODE STATUS: DNR  TOTAL TIME TAKING CARE OF THIS PATIENT:30 minutes.   Discharge tomorrow after dressing change  02/28/17 R M.D on 02/26/2017 at 12:38 PM  Between 7am to 6pm - Pager - [phone]  After 6pm go to [email or web address] - password [email or web address] [facility]  [facility] [place] [facility]  Office  [phone]  CC: Primary care physician; [phone]  Y, MD  Note: This dictation was prepared with Dragon dictation along with smaller phrase technology. Any transcriptional errors that result from this process are unintentional.

## 2017-02-26 NOTE — NC FL2 (Signed)
MEDICAID FL2 LEVEL OF CARE SCREENING TOOL     IDENTIFICATION  Patient Name: [name] [name] Birthdate: 1929/10/31 Sex: female Admission Date (Current Location): 02/19/2017  [facility] and [place] Number:  [address]  ([id number] [facility]) Facility and Address:  [facility], [address], [place], [place] [address]      Provider Number: [id number]  Attending Physician Name and Address:  Milagros Loll, MD  Relative Name and Phone Number:  DSS Guardian Lazaro Arms [phone] or [phone])    Current Level of Care: Hospital Recommended Level of Care: Assisted Living Facility Prior Approval Number:    Date Approved/Denied:   PASRR Number:  ([id number] )  Discharge Plan: Domiciliary (Rest home)    Current Diagnoses: Patient Active Problem List   Diagnosis Date Noted  . Pressure injury of skin 02/20/2017  . Gangrene of toe of left foot (HCC) 02/19/2017  . Sinus tachycardia 12/09/2015    Orientation RESPIRATION BLADDER Height & Weight     Self  Normal Incontinent Weight: 134 lb (60.8 kg) Height:  5\' 3"  (160 cm)  BEHAVIORAL SYMPTOMS/MOOD NEUROLOGICAL BOWEL NUTRITION STATUS      Incontinent Diet (Diet: DYS 2 )  AMBULATORY STATUS COMMUNICATION OF NEEDS Skin   Total Care Verbally Surgical wounds (incision: ampuation of second toe of left foot. )                       Personal Care Assistance Level of Assistance  Bathing, Feeding, Dressing, Total care Bathing Assistance: Maximum assistance Feeding assistance: Maximum assistance Dressing Assistance: Maximum assistance Total Care Assistance: Maximum assistance   Functional Limitations Info  Sight, Hearing, Speech Sight Info: Adequate Hearing Info: Adequate Speech Info: Adequate    SPECIAL CARE FACTORS FREQUENCY                       Contractures Contractures Info: Not present    Additional Factors Info  Code Status, Allergies Code Status Info:  (DNR ) Allergies Info:  (No  Known Allergies. ) Psychotropic Info: Remeron Insulin Sliding Scale Info: Novolog (1-9 units, TID with meals)       Current Medications (02/26/2017):  This is the current hospital active medication list Current Facility-Administered Medications  Medication Dose Route Frequency Provider Last Rate Last Dose  . acetaminophen (TYLENOL) tablet 650 mg  650 mg Oral Q6H PRN Milagros Loll, MD       Or  . acetaminophen (TYLENOL) suppository 650 mg  650 mg Rectal Q6H PRN Sudini, Srikar, MD      . albuterol (PROVENTIL) (2.5 MG/3ML) 0.083% nebulizer solution 2.5 mg  2.5 mg Nebulization Q2H PRN Sudini, Wardell Heath, MD      . apixaban (ELIQUIS) tablet 5 mg  5 mg Oral BID Sudini, Srikar, MD      . bisacodyl (DULCOLAX) suppository 10 mg  10 mg Rectal Daily PRN Milagros Loll, MD      . digoxin Margit Banda) tablet 0.125 mg  0.125 mg Oral Daily Sudini, Wardell Heath, MD   0.125 mg at 02/26/17 0908  . diltiazem (CARDIZEM) 100 mg in dextrose 5 % 100 mL (1 mg/mL) infusion  5-15 mg/hr Intravenous Titrated Milagros Loll, MD   Stopped at 02/23/17 2315  . diltiazem (CARDIZEM) tablet 60 mg  60 mg Oral Q12H Milagros Loll, MD   60 mg at 02/26/17 0909  . docusate sodium (COLACE) capsule 100 mg  100 mg Oral BID Milagros Loll, MD   100 mg at 02/26/17 0908  .  feeding supplement (ENSURE ENLIVE) (ENSURE ENLIVE) liquid 237 mL  237 mL Oral TID BM Milagros Loll, MD   237 mL at 02/26/17 0951  . insulin aspart (novoLOG) injection 0-9 Units  0-9 Units Subcutaneous TID WC Sudini, Srikar, MD      . loratadine (CLARITIN) tablet 10 mg  10 mg Oral Daily Milagros Loll, MD   10 mg at 02/26/17 0909  . magnesium oxide (MAG-OX) tablet 400 mg  400 mg Oral Daily Milagros Loll, MD   400 mg at 02/26/17 0909  . metoprolol tartrate (LOPRESSOR) tablet 25 mg  25 mg Oral BID Milagros Loll, MD   25 mg at 02/26/17 0909  . mirtazapine (REMERON) tablet 7.5 mg  7.5 mg Oral QHS Sudini, Srikar, MD   7.5 mg at 02/25/17 2200  . multivitamin with minerals tablet 1  tablet  1 tablet Oral Daily Milagros Loll, MD   1 tablet at 02/26/17 0909  . ondansetron (ZOFRAN) tablet 4 mg  4 mg Oral Q6H PRN Milagros Loll, MD       Or  . ondansetron (ZOFRAN) injection 4 mg  4 mg Intravenous Q6H PRN Milagros Loll, MD   4 mg at 02/25/17 1411  . piperacillin-tazobactam (ZOSYN) IVPB 3.375 g  3.375 g Intravenous Q8H Milagros Loll, MD   Stopped at 02/26/17 1318  . polyethylene glycol (MIRALAX / GLYCOLAX) packet 17 g  17 g Oral Daily PRN Sudini, Srikar, MD      . potassium chloride 20 MEQ/15ML (10%) solution 20 mEq  20 mEq Oral Daily Milagros Loll, MD   20 mEq at 02/26/17 0910  . traMADol (ULTRAM) tablet 50 mg  50 mg Oral Q6H PRN Milagros Loll, MD   50 mg at 02/26/17 0010     Discharge Medications: Please see discharge summary for a list of discharge medications.  Current Discharge Medication List        START taking these medications   Details  amoxicillin-clavulanate (AUGMENTIN) 250-62.5 MG/5ML suspension Take 10 mLs (500 mg total) by mouth 2 (two) times daily. Qty: 100 mL, Refills: 0    digoxin (LANOXIN) 0.125 MG tablet Take 1 tablet (0.125 mg total) by mouth daily. Qty: 30 tablet, Refills: 0          CONTINUE these medications which have NOT CHANGED   Details  albuterol (PROVENTIL HFA;VENTOLIN HFA) 108 (90 Base) MCG/ACT inhaler Inhale 2 puffs into the lungs every 6 (six) hours as needed for wheezing or shortness of breath.    apixaban (ELIQUIS) 5 MG TABS tablet Take 1 tablet (5 mg total) by mouth 2 (two) times daily. Qty: 60 tablet, Refills: 0    diclofenac sodium (VOLTAREN) 1 % GEL Apply 2 g topically 2 (two) times daily.    diltiazem (CARDIZEM) 60 MG tablet Take 1 tablet (60 mg total) by mouth 2 (two) times daily. Qty: 60 tablet, Refills: 0    fexofenadine (ALLEGRA) 180 MG tablet Take 180 mg by mouth daily.    fluticasone (FLONASE) 50 MCG/ACT nasal spray Place 1 spray into both nostrils daily.    glipiZIDE (GLUCOTROL) 5 MG tablet Take  5 mg by mouth daily.     metFORMIN (GLUCOPHAGE) 1000 MG tablet Take 1,000 mg by mouth 2 (two) times daily with a meal.    metoprolol tartrate (LOPRESSOR) 25 mg/10 mL SUSP Take 50 mg by mouth 2 (two) times daily.    mirtazapine (REMERON) 7.5 MG tablet Take 7.5 mg by mouth at bedtime.    mupirocin ointment (BACTROBAN) 2 %  Apply to affected area 3 times daily Qty: 22 g, Refills: 0    potassium chloride 20 MEQ/15ML (10%) SOLN Take 20 mEq by mouth daily.    acetaminophen (TYLENOL) 500 MG tablet Take 1,000 mg by mouth 3 (three) times daily. Take for arthritis pain.    ipratropium (ATROVENT) 0.02 % nebulizer solution Take 0.5 mg by nebulization daily.    montelukast (SINGULAIR) 10 MG tablet Take 10 mg by mouth every morning.     omeprazole (PRILOSEC) 20 MG capsule Take 20 mg by mouth 2 (two) times daily before a meal. Take 30 minutes before breakfast.    polyethylene glycol (MIRALAX / GLYCOLAX) packet Take 17 g by mouth every other day.         STOP taking these medications     amLODipine (NORVASC) 5 MG tablet      cephALEXin (KEFLEX) 250 MG capsule      furosemide (LASIX) 40 MG tablet     Relevant Imaging Results:  Relevant Lab Results:   Additional Information  (SSN: [id number])  Darleene Cleaver, LCSWA

## 2017-02-26 NOTE — Discharge Summary (Signed)
[employer] [facility] - [place] at [facility]   PATIENT NAME: [name] [name]    MR#:  [id number]  DATE OF BIRTH:  May 10, 1930  DATE OF ADMISSION:  02/19/2017 ADMITTING PHYSICIAN: Milagros LollSrikar Cisco Kindt, MD  DATE OF DISCHARGE: Anticipated on 02/27/2017  PRIMARY CARE PHYSICIAN: [name] [name], [name] Y, MD   ADMISSION DIAGNOSIS:  Cellulitis of toe of right foot [L03.031]  DISCHARGE DIAGNOSIS:  Active Problems:   Gangrene of toe of left foot (HCC)   Pressure injury of skin   SECONDARY DIAGNOSIS:   Past Medical History:  Diagnosis Date  . A-fib (HCC)   . Allergic rhinitis   . Alzheimer disease   . CAD (coronary artery disease)   . Cardiomyopathy (HCC)   . CHF (congestive heart failure) (HCC)   . COPD (chronic obstructive pulmonary disease) (HCC)   . Dementia   . Diabetes mellitus without complication (HCC)   . Diverticulosis   . Eczema   . Hyperlipemia   . Hypertension   . Urinary incontinence      ADMITTING HISTORY  HISTORY OF PRESENT ILLNESS:  [name] IslamLula Lineback  is a 83 [name].o. female with a known history of COPD, diabetes, CHF, atrial fibrillation, dementia, wheelchair bound presents from assisted living facility due to left third toe gangrene. Patient was recently seen in the emergency room and referred to wound care center. She was seen in the wound care center and sent back to the emergency room for amputation. Here patient has been found to be tachycardic in the 140s along with lactic acid of 3.2 sepsis and is being admitted to the hospital. Patient has dementia and unable to provide any history. She is DO NOT RESUSCITATE. History obtained from her caretaker from assisted living facility.   HOSPITAL COURSE:   * Paroxysmal atrial fibrillation. On cardizem/Lopressor and Digoxin. Improved. Eliquis held for surgery. Now restarted.  * Hypokalemia- resolved  * Left second toe gangrene with cellulitis. Sepsis present on admission.  IV Zosyn. Change to augmentin for 5 days after  discharge blood cultures - Negative Consulted podiatry for amputation of the toe.  S/p amputation. POD - 2  * Chronic diastolic congestive heart failure. No signs of fluid overload.  * Diabetes mellitus. oral hypoglycemics stopped. On sliding scale insulin.  * Dementia. Monitor for inpatient delirium.  Discharge to SNF after dressing change by Podiatry.  CONSULTS OBTAINED:  Treatment Team:  Renford DillsSchnier, Gregory G, MD  DRUG ALLERGIES:  No Known Allergies  DISCHARGE MEDICATIONS:   Current Discharge Medication List    START taking these medications   Details  amoxicillin-clavulanate (AUGMENTIN) 250-62.5 MG/5ML suspension Take 10 mLs (500 mg total) by mouth 2 (two) times daily. Qty: 100 mL, Refills: 0    digoxin (LANOXIN) 0.125 MG tablet Take 1 tablet (0.125 mg total) by mouth daily. Qty: 30 tablet, Refills: 0      CONTINUE these medications which have NOT CHANGED   Details  albuterol (PROVENTIL HFA;VENTOLIN HFA) 108 (90 Base) MCG/ACT inhaler Inhale 2 puffs into the lungs every 6 (six) hours as needed for wheezing or shortness of breath.    apixaban (ELIQUIS) 5 MG TABS tablet Take 1 tablet (5 mg total) by mouth 2 (two) times daily. Qty: 60 tablet, Refills: 0    diclofenac sodium (VOLTAREN) 1 % GEL Apply 2 g topically 2 (two) times daily.    diltiazem (CARDIZEM) 60 MG tablet Take 1 tablet (60 mg total) by mouth 2 (two) times daily. Qty: 60 tablet, Refills: 0    fexofenadine (ALLEGRA)  180 MG tablet Take 180 mg by mouth daily.    fluticasone (FLONASE) 50 MCG/ACT nasal spray Place 1 spray into both nostrils daily.    glipiZIDE (GLUCOTROL) 5 MG tablet Take 5 mg by mouth daily.     metFORMIN (GLUCOPHAGE) 1000 MG tablet Take 1,000 mg by mouth 2 (two) times daily with a meal.    metoprolol tartrate (LOPRESSOR) 25 mg/10 mL SUSP Take 50 mg by mouth 2 (two) times daily.    mirtazapine (REMERON) 7.5 MG tablet Take 7.5 mg by mouth at bedtime.    mupirocin ointment (BACTROBAN)  2 % Apply to affected area 3 times daily Qty: 22 g, Refills: 0    potassium chloride 20 MEQ/15ML (10%) SOLN Take 20 mEq by mouth daily.    acetaminophen (TYLENOL) 500 MG tablet Take 1,000 mg by mouth 3 (three) times daily. Take for arthritis pain.    ipratropium (ATROVENT) 0.02 % nebulizer solution Take 0.5 mg by nebulization daily.    montelukast (SINGULAIR) 10 MG tablet Take 10 mg by mouth every morning.     omeprazole (PRILOSEC) 20 MG capsule Take 20 mg by mouth 2 (two) times daily before a meal. Take 30 minutes before breakfast.    polyethylene glycol (MIRALAX / GLYCOLAX) packet Take 17 g by mouth every other day.      STOP taking these medications     amLODipine (NORVASC) 5 MG tablet      cephALEXin (KEFLEX) 250 MG capsule      furosemide (LASIX) 40 MG tablet         Today   VITAL SIGNS:  Blood pressure 99/67, pulse 94, temperature 98 F (36.7 C), temperature source Axillary, resp. rate 14, height 5\' 3"  (1.6 m), weight 60.8 kg (134 lb), SpO2 93 %.  I/O:   Intake/Output Summary (Last 24 hours) at 02/26/17 1242 Last data filed at 02/26/17 1006  Gross per 24 hour  Intake           1117.5 ml  Output                0 ml  Net           1117.5 ml    PHYSICAL EXAMINATION:  Physical Exam  GENERAL:  47 [name].o.-year-old patient lying in the bed with no acute distress.  LUNGS: Normal breath sounds bilaterally, no wheezing, rales,rhonchi or crepitation. No use of accessory muscles of respiration.  CARDIOVASCULAR: S1, S2 normal. No murmurs, rubs, or gallops.  ABDOMEN: Soft, non-tender, non-distended. Bowel sounds present. No organomegaly or mass.  NEUROLOGIC: Moves all 4 extremities. PSYCHIATRIC: The patient is pleasantly confused Left foot dressing  DATA REVIEW:   CBC  Recent Labs Lab 02/23/17 0543  WBC 6.1  HGB 12.9  HCT 37.6  PLT 364    Chemistries   Recent Labs Lab 02/23/17 0543  02/25/17 0410 02/26/17 0345  NA 143  < > 146*  --   K 2.6*  < > 4.6  --    CL 107  < > 115*  --   CO2 25  < > 24  --   GLUCOSE 120*  < > 129*  --   BUN <5*  < > 5*  --   CREATININE 0.49  < > 0.50 0.77  CALCIUM 8.4*  < > 8.3*  --   MG 1.6*  --   --   --   < > = values in this interval not displayed.  Cardiac Enzymes No results for input(s):  TROPONINI in the last 168 hours.  Microbiology Results  Results for orders placed or performed during the hospital encounter of 02/19/17  Blood culture (routine x 2)     Status: None   Collection Time: 02/19/17 12:35 PM  Result Value Ref Range Status   Specimen Description BLOOD BLOOD RIGHT HAND  Final   Special Requests   Final    BOTTLES DRAWN AEROBIC AND ANAEROBIC Blood Culture results may not be optimal due to an inadequate volume of blood received in culture bottles   Culture NO GROWTH 5 DAYS  Final   Report Status 02/24/2017 FINAL  Final  Blood culture (routine x 2)     Status: None   Collection Time: 02/19/17  4:41 PM  Result Value Ref Range Status   Specimen Description BLOOD ARM RIGHT UPPER ARM  Final   Special Requests   Final    BOTTLES DRAWN AEROBIC AND ANAEROBIC Blood Culture adequate volume   Culture NO GROWTH 5 DAYS  Final   Report Status 02/24/2017 FINAL  Final  MRSA PCR Screening     Status: None   Collection Time: 02/19/17  7:54 PM  Result Value Ref Range Status   MRSA by PCR NEGATIVE NEGATIVE Final    Comment:        The GeneXpert MRSA Assay (FDA approved for NASAL specimens only), is one component of a comprehensive MRSA colonization surveillance program. It is not intended to diagnose MRSA infection nor to guide or monitor treatment for MRSA infections.     RADIOLOGY:  No results found.  Follow up with PCP in 1 week.  Management plans discussed with the patient, family and they are in agreement.  CODE STATUS:     Code Status Orders        Start     Ordered   02/19/17 1840  Do not attempt resuscitation (DNR)  Continuous    Question Answer Comment  In the event of  cardiac or respiratory ARREST Do not call a "code blue"   In the event of cardiac or respiratory ARREST Do not perform Intubation, CPR, defibrillation or ACLS   In the event of cardiac or respiratory ARREST Use medication by any route, position, wound care, and other measures to relive pain and suffering. May use oxygen, suction and manual treatment of airway obstruction as needed for comfort.      02/19/17 1841    Code Status History    Date Active Date Inactive Code Status Order ID Comments User Context   12/10/2015  8:33 AM 12/11/2015  6:33 PM DNR [id number]  Delfino Lovett, MD Inpatient   12/09/2015  3:00 PM 12/09/2015  5:18 PM Full Code [id number]  Auburn Bilberry, MD ED    Advance Directive Documentation     Most Recent Value  Type of Advance Directive  Out of facility DNR (pink MOST or yellow form)  Pre-existing out of facility DNR order (yellow form or pink MOST form)  Yellow form placed in chart (order not valid for inpatient use)  "MOST" Form in Place?  -      TOTAL TIME TAKING CARE OF THIS PATIENT ON DAY OF DISCHARGE: more than 30 minutes.   Milagros Loll R M.D on 02/26/2017 at 12:42 PM  Between 7am to 6pm - Pager - [phone]  After 6pm go to [email or web address] - password [email or web address] [facility]  [facility] [place] [facility]  Office  [phone]  CC: Primary care physician; [name] Cox, MD  Note: This dictation was prepared  with Dragon dictation along with smaller phrase technology. Any transcriptional errors that result from this process are unintentional.

## 2017-02-26 NOTE — Progress Notes (Signed)
Talked to Dr. Elpidio Anis about patient's blood sugar of 249, patient was taking glipizide and metformin at home, per MD patient had an episode of hypoglycemia before with insulin, MD will place low dose sliding scale for patient. No other concern at the moment. RN will continue to monitor.

## 2017-02-26 NOTE — Discharge Instructions (Addendum)
Resume dysphagia diet and activity as before; needs feeding assistance Home health PT and RN, needs dressing changes as recommended by podiatry 3 times in a week Outpatient follow-up with podiatry in 1-2 weeks for suture removal and reassessment Follow-up with primary care physician in 5-7days or sooner as needed

## 2017-02-27 LAB — GLUCOSE, CAPILLARY
GLUCOSE-CAPILLARY: 147 mg/dL — AB (ref 65–99)
GLUCOSE-CAPILLARY: 127 mg/dL — AB (ref 65–99)
GLUCOSE-CAPILLARY: 115 mg/dL — AB (ref 65–99)

## 2017-02-27 MED ORDER — METOPROLOL TARTRATE 25 MG PO TABS
12.5000 mg | ORAL_TABLET | Freq: Two times a day (BID) | ORAL | Status: DC
  Administered 2017-02-27 – 2017-03-02 (×5): 12.5 mg via ORAL
  Filled 2017-02-27 (×5): qty 1

## 2017-02-27 MED ORDER — DILTIAZEM HCL 30 MG PO TABS
30.0000 mg | ORAL_TABLET | Freq: Two times a day (BID) | ORAL | Status: DC
  Administered 2017-02-27 – 2017-02-28 (×2): 30 mg via ORAL
  Filled 2017-02-27 (×3): qty 1

## 2017-02-27 NOTE — Progress Notes (Signed)
[employer] [facility] - [place] at [facility]   PATIENT NAME: [name] [name]    MR#:  [id number]  DATE OF BIRTH:  16-Oct-1929  SUBJECTIVEseen at bedside, hypotensive last 24 hours did not get BP medicines. Adjust the dose of Cardizem, beta blockers now. Patient is confused  CHIEF COMPLAINT:   Chief Complaint  Patient presents with  . Foot Pain   Confused. Wakes up calling her name. HR improved.  Doesn't seem to be in pain  REVIEW OF SYSTEMS:    Review of Systems  Unable to perform ROS: Dementia    DRUG ALLERGIES:  No Known Allergies  VITALS:  Blood pressure (!) 83/55, pulse 70, temperature 98.4 F (36.9 C), resp. rate 18, height 5\' 3"  (1.6 m), weight 61.7 kg (136 lb), SpO2 98 %.  PHYSICAL EXAMINATION:   Physical Exam  GENERAL:  83 y.o.-year-old patient lying in the bed with no acute distress.  EYES: Pupils equal, round, reactive to light and accommodation. No scleral icterus. Extraocular muscles intact.  HEENT: Head atraumatic, normocephalic. Oropharynx and nasopharynx clear.  NECK:  Supple, no jugular venous distention. No thyroid enlargement, no tenderness.  LUNGS: Normal breath sounds bilaterally, no wheezing, rales, rhonchi. No use of accessory muscles of respiration.  CARDIOVASCULAR: S1, S2 tachycardia, irregular. No murmurs, rubs, or gallops.  ABDOMEN: Soft, nontender, nondistended. Bowel sounds present. No organomegaly or mass.  EXTREMITIES: No cyanosis, clubbing or edema b/l.    NEUROLOGIC: Moves all 4 extremities  PSYCHIATRIC: The patient is confused SKIN: Left foot dressing.dressing is changed  last night.  LABORATORY PANEL:   CBC  Recent Labs Lab 02/23/17 0543  WBC 6.1  HGB 12.9  HCT 37.6  PLT 364   ------------------------------------------------------------------------------------------------------------------ Chemistries   Recent Labs Lab 02/23/17 0543  02/25/17 0410 02/26/17 0345  NA 143  < > 146*  --   K 2.6*  < > 4.6  --   CL 107   < > 115*  --   CO2 25  < > 24  --   GLUCOSE 120*  < > 129*  --   BUN <5*  < > 5*  --   CREATININE 0.49  < > 0.50 0.77  CALCIUM 8.4*  < > 8.3*  --   MG 1.6*  --   --   --   < > = values in this interval not displayed. ------------------------------------------------------------------------------------------------------------------  Cardiac Enzymes No results for input(s): TROPONINI in the last 168 hours. ------------------------------------------------------------------------------------------------------------------  RADIOLOGY:  No results found.   ASSESSMENT AND PLAN:   * Paroxysmal atrial fibrillation. Hypotension today, adjusted dose of Cardizem, beta blocker. Continue digoxin, unable to discharge today because of hypotension monitor for another 24 hours likely discharge tomorrow. Started back on Eliquis.   * Hypokalemia- resolved  * Left second toe gangrene with cellulitis. Sepsis present on admission.  IV Zosyn.  blood cultures - Negative Consulted podiatry for amputation of the toe.  S/p amputation. POD - 2.recent changes by podiatry yesterday. Patient can be discharged tomorrow with Augmentin for 5 days.  * Chronic diastolic congestive heart failure. No signs of fluid overload.  * Diabetes mellitus. oral hypoglycemics stopped. On sliding scale insulin.by mouth intake is poor.  * Dementia. Monitor for inpatient delirium.  * DVT prophylaxis . On eliquis  All the records are reviewed and case discussed with Care Management/Social Workerr. Management plans discussed with the patient, family and they are in agreement.  CODE STATUS: DNR  TOTAL TIME TAKING CARE OF THIS PATIENT:30 minutes.  Discharge tomorrow after dressing change  Author Hatlestad M.D on 02/27/2017 at 10:47 AM  Between 7am to 6pm - Pager - [phone]  After 6pm go to [email or web address] - password [email or web address] [facility]  [facility] [place] [facility]  Office  [phone]  CC: Primary care  physician; Angela Cox, MD  Note: This dictation was prepared with Dragon dictation along with smaller phrase technology. Any transcriptional errors that result from this process are unintentional.

## 2017-02-27 NOTE — Progress Notes (Signed)
Subjective: Patient is 2 days status post amputation for gangrenous second toe left foot. Overall she's been doing well and has not complained much pain.  Objective: Bandages removed today and was evaluated. Incision margins are intact there is no cellulitis or redness no evidence of infection to the region.  Assessment: Progressing satisfaction this point.  Plan: Redressed the wound today. Patient is nonambulatory will need this to be followed for the next few weeks until he see if the incision heals primarily. Recommended dressing change every other day with sterile 4 x 4's and Kerlix wrap.

## 2017-02-28 LAB — GLUCOSE, CAPILLARY
GLUCOSE-CAPILLARY: 174 mg/dL — AB (ref 65–99)
GLUCOSE-CAPILLARY: 178 mg/dL — AB (ref 65–99)
Glucose-Capillary: 121 mg/dL — ABNORMAL HIGH (ref 65–99)
Glucose-Capillary: 200 mg/dL — ABNORMAL HIGH (ref 65–99)
Glucose-Capillary: 64 mg/dL — ABNORMAL LOW (ref 65–99)
Glucose-Capillary: 83 mg/dL (ref 65–99)

## 2017-02-28 LAB — BASIC METABOLIC PANEL
ANION GAP: 6 (ref 5–15)
BUN: 13 mg/dL (ref 6–20)
CHLORIDE: 117 mmol/L — AB (ref 101–111)
CO2: 24 mmol/L (ref 22–32)
Calcium: 8.7 mg/dL — ABNORMAL LOW (ref 8.9–10.3)
Creatinine, Ser: 0.73 mg/dL (ref 0.44–1.00)
GFR calc non Af Amer: >60 mL/min (ref >60)
Glucose, Bld: 218 mg/dL — ABNORMAL HIGH (ref 65–99)
POTASSIUM: 4.3 mmol/L (ref 3.5–5.1)
SODIUM: 147 mmol/L — AB (ref 135–145)

## 2017-02-28 LAB — MAGNESIUM: MAGNESIUM: 2.2 mg/dL (ref 1.7–2.4)

## 2017-02-28 MED ORDER — DIGOXIN 125 MCG PO TABS
0.0625 mg | ORAL_TABLET | Freq: Every day | ORAL | Status: DC
  Filled 2017-02-28: qty 0.5

## 2017-02-28 NOTE — Progress Notes (Signed)
Patient's amputation site dressing was changed yesterday she is stable. Would recommend dressing changes approximately every 3 days and return to my office in about 3 weeks for reevaluation and suture removal. Would continue the oral antibiotics. Have her facility call if any questions or problems arise.

## 2017-02-28 NOTE — Progress Notes (Signed)
Pt. CBG 64, orange juice and ensure given. CBG rechecked and increased to 83. Pt was asymptomatic. Will continue to monitor pt.

## 2017-02-28 NOTE — Progress Notes (Signed)
[employer] - [place] at [facility]   PATIENT NAME: [name] [name]    MR#:  [id number]  DATE OF BIRTH:  1930/02/06  SUBJECTIVE; hypotensive, patient has atrial fibrillation but rate controlled.  CHIEF COMPLAINT:   Chief Complaint  Patient presents with  . Foot Pain   Confused. Wakes up calling her name. HR improved.  Doesn't seem to be in pain  REVIEW OF SYSTEMS:    Review of Systems  Unable to perform ROS: Dementia    DRUG ALLERGIES:  No Known Allergies  VITALS:  Blood pressure 94/65, pulse 78, temperature 98.3 F (36.8 C), temperature source Oral, resp. rate 18, height 5\' 3"  (1.6 m), weight 59 kg (130 lb), SpO2 (!) 89 %.  PHYSICAL EXAMINATION:   Physical Exam  GENERAL:  83 y.o.-year-old patient lying in the bed with no acute distress.  EYES: Pupils equal, round, reactive to light and accommodation. No scleral icterus. Extraocular muscles intact.  HEENT: Head atraumatic, normocephalic. Oropharynx and nasopharynx clear.  NECK:  Supple, no jugular venous distention. No thyroid enlargement, no tenderness.  LUNGS: Normal breath sounds bilaterally, no wheezing, rales, rhonchi. No use of accessory muscles of respiration.  CARDIOVASCULAR: S1, S2 tachycardia, irregular. No murmurs, rubs, or gallops.  ABDOMEN: Soft, nontender, nondistended. Bowel sounds present. No organomegaly or mass.  EXTREMITIES: No cyanosis, clubbing or edema b/l.    NEUROLOGIC: Moves all 4 extremities  PSYCHIATRIC: The patient is confused SKIN: Left foot dressing.dressing is changed  last night.  LABORATORY PANEL:   CBC  Recent Labs Lab 02/23/17 0543  WBC 6.1  HGB 12.9  HCT 37.6  PLT 364   ------------------------------------------------------------------------------------------------------------------ Chemistries   Recent Labs Lab 02/23/17 0543  02/25/17 0410 02/26/17 0345  NA 143  < > 146*  --   K 2.6*  < > 4.6  --   CL 107  < > 115*  --   CO2 25  < > 24  --   GLUCOSE  120*  < > 129*  --   BUN <5*  < > 5*  --   CREATININE 0.49  < > 0.50 0.77  CALCIUM 8.4*  < > 8.3*  --   MG 1.6*  --   --   --   < > = values in this interval not displayed. ------------------------------------------------------------------------------------------------------------------  Cardiac Enzymes No results for input(s): TROPONINI in the last 168 hours. ------------------------------------------------------------------------------------------------------------------  RADIOLOGY:  No results found.   ASSESSMENT AND PLAN:   * Paroxysmal atrial fibrillation. Hypotension 'Adjusted the dose of metoprolol, Cardizem, continue digoxin, likely discharge tomorrow to assisted living facility.  * Hypokalemia- resolved; labs today.  * Left second toe gangrene with cellulitis. Sepsis present on admission.  IV Zosyn.  blood cultures - Negative Consulted podiatry for amputation of the toe.  S/p amputation. POD - 3.recent changes by podiatry yesterday. Discharge with Augmentin for 3 days.  * Chronic diastolic congestive heart failure. No signs of fluid overload.  * Diabetes mellitus. oral hypoglycemics stopped. On sliding scale insulin.by mouth intake is poor.  * Dementia. Monitor for inpatient delirium.  * DVT prophylaxis . On eliquis  All the records are reviewed and case discussed with Care Management/Social Workerr. Management plans discussed with the patient, family and they are in agreement.  CODE STATUS: DNR  TOTAL TIME TAKING CARE OF THIS PATIENT:30 minutes.   Discharge tomorrow after dressing change  Kamaree Wheatley M.D on 02/28/2017 at 11:54 AM  Between 7am to 6pm - Pager - [phone]  After 6pm  go to [email or web address] - password [email or web address] [facility]  [facility] [place] [facility]  Office  [phone]  CC: Primary care physician; Angela Cox, MD  Note: This dictation was prepared with Dragon dictation along with smaller phrase technology. Any  transcriptional errors that result from this process are unintentional.

## 2017-03-01 ENCOUNTER — Other Ambulatory Visit: Payer: Self-pay | Admitting: Pathology

## 2017-03-01 LAB — GLUCOSE, CAPILLARY
GLUCOSE-CAPILLARY: 140 mg/dL — AB (ref 65–99)
GLUCOSE-CAPILLARY: 80 mg/dL (ref 65–99)
Glucose-Capillary: 248 mg/dL — ABNORMAL HIGH (ref 65–99)
Glucose-Capillary: 171 mg/dL — ABNORMAL HIGH (ref 65–99)

## 2017-03-01 LAB — SURGICAL PATHOLOGY

## 2017-03-01 MED ORDER — SODIUM CHLORIDE 0.9% FLUSH
3.0000 mL | Freq: Two times a day (BID) | INTRAVENOUS | Status: DC
  Administered 2017-03-02 – 2017-03-03 (×3): 3 mL via INTRAVENOUS

## 2017-03-01 MED ORDER — DILTIAZEM HCL 25 MG/5ML IV SOLN
5.0000 mg | Freq: Once | INTRAVENOUS | Status: DC
  Filled 2017-03-01: qty 5

## 2017-03-01 MED ORDER — AMOXICILLIN-POT CLAVULANATE 875-125 MG PO TABS
1.0000 | ORAL_TABLET | Freq: Two times a day (BID) | ORAL | Status: DC
  Administered 2017-03-01 – 2017-03-03 (×4): 1 via ORAL
  Filled 2017-03-01 (×5): qty 1

## 2017-03-01 NOTE — Progress Notes (Signed)
Nutrition Follow-up  DOCUMENTATION CODES:   Non-severe (moderate) malnutrition in context of chronic illness  INTERVENTION:  1. Continue Ensure Enlive po TID, each supplement provides 350 kcal and 20  grams of protein  2. Continue MVI w/ Minerals  NUTRITION DIAGNOSIS:   Malnutrition (Moderate) related to chronic illness (Alzheimer's, Stg III pressure ulcer, COPD) as evidenced by percent weight loss, moderate depletion of body fat, moderate depletions of muscle mass. -ongoing  GOAL:   Patient will meet greater than or equal to 90% of their needs -progressing  MONITOR:   PO intake, Supplement acceptance, Labs, Weight trends, Skin, I & O's  REASON FOR ASSESSMENT:   Consult Assessment of nutrition requirement/status (and wound healing)  ASSESSMENT:   83 year old female with PMHx of Alzheimer's disease, DM type 2, CHF, urinary incontinence, cardiomyopathy, COPD, CAD, A-fib, HTN, HLD admitted with left second toe gangrene with cellulitis, sepsis.   Patient unable to provide history. S/P L 2nd toe amputation. Hypokalemia resolved per MD note. Likely discharge tomorrow. Ate 15% at breakfast and lunch per Nurse Tech. Drank 2 ensures with breakfast as well.  Labs reviewed:  CBGs 248, 140  Medications reviewed and include:  0-9 Units TID, Mag-Ox, Remeron, 20K+  Diet Order:  DIET - DYS 1 Room service appropriate? Yes; Fluid consistency: Thin  Skin:  Wound (see comment) (Stg III left hip, second left toe necrotic)  Last BM:  02/27/2017 (type 6)  Height:   Ht Readings from Last 1 Encounters:  02/19/17 5\' 3"  (1.6 m)    Weight:   Wt Readings from Last 1 Encounters:  03/01/17 129 lb 12.8 oz (58.9 kg)    Ideal Body Weight:  52.3 kg  BMI:  Body mass index is 22.99 kg/m.  Estimated Nutritional Needs:   Kcal:  1440-1680 (30-35 kcal/kg)  Protein:  67-77 grams (1.4-1.6 grams/kg)  Fluid:  1.2 L/day (25 ml/kg)  EDUCATION NEEDS:   Education needs no appropriate at  this time  03/03/17. Maryuri Warnke, MS, RD LDN Inpatient Clinical Dietitian Pager [phone]

## 2017-03-01 NOTE — Care Management (Signed)
PT spoke with [name] [name] who stated that at baseline patient is a 2 person "lift", and utilizes a WC at the facility. It was reported that patient is open with [facility].  Message has been left with Tim at [facility] to confirm that patient is open with services.  RNCM following. Plan to return to [place] [name]

## 2017-03-01 NOTE — Progress Notes (Signed)
Pt. HR ran from 60's - 150's unsustained throughout the night. Pt had no c/o pain or distress.

## 2017-03-01 NOTE — Progress Notes (Addendum)
[employer] - [place] at [facility]   PATIENT NAME: [name] [name]    MR#:  [id number]  DATE OF BIRTH:  [birth year]  SUBJECTIVE; hypotensive, patient has atrial fibrillation but rate controlled.  CHIEF COMPLAINT:   Chief Complaint  Patient presents with  . Foot Pain   ch demented Bradycardic today    REVIEW OF SYSTEMS:    Review of Systems  Unable to perform ROS: Dementia    DRUG ALLERGIES:  No Known Allergies  VITALS:  Blood pressure 97/65, pulse (!) 40, temperature 97.6 F (36.4 C), temperature source Axillary, resp. rate 16, height 5\' 3"  (1.6 m), weight 58.9 kg (129 lb 12.8 oz), SpO2 100 %.  PHYSICAL EXAMINATION:   Physical Exam  GENERAL:  83 y.o.-year-old patient lying in the bed with no acute distress.  EYES: Pupils equal, round, reactive to light and accommodation. No scleral icterus. Extraocular muscles intact.  HEENT: Head atraumatic, normocephalic. Oropharynx and nasopharynx clear.  NECK:  Supple, no jugular venous distention. No thyroid enlargement, no tenderness.  LUNGS: Normal breath sounds bilaterally, no wheezing, rales, rhonchi. No use of accessory muscles of respiration.  CARDIOVASCULAR: S1, S2 tachycardia, irregular. No murmurs, rubs, or gallops.  ABDOMEN: Soft, nontender, nondistended. Bowel sounds present. No organomegaly or mass.  EXTREMITIES: No cyanosis, clubbing or edema b/l.    NEUROLOGIC: Moves all 4 extremities  PSYCHIATRIC: The patient is confused SKIN: Left foot dressing.dressing is changed  last night.  LABORATORY PANEL:   CBC  Recent Labs Lab 02/23/17 0543  WBC 6.1  HGB 12.9  HCT 37.6  PLT 364   ------------------------------------------------------------------------------------------------------------------ Chemistries   Recent Labs Lab 02/28/17 1158  NA 147*  K 4.3  CL 117*  CO2 24  GLUCOSE 218*  BUN 13  CREATININE 0.73  CALCIUM 8.7*  MG 2.2    ------------------------------------------------------------------------------------------------------------------  Cardiac Enzymes No results for input(s): TROPONINI in the last 168 hours. ------------------------------------------------------------------------------------------------------------------  RADIOLOGY:  No results found.   ASSESSMENT AND PLAN:   * Paroxysmal atrial fibrillation. Hypotension , bradycardic too,  heart rate at around 46' Adjusted the dose of metoprolol, discontinued Cardizem and  digoxin, likely discharge tomorrow to assisted living facility.  * Hypokalemia- resolved; labs today.  * Left second toe gangrene with cellulitis. Sepsis present on admission.  IV Zosyn.  blood cultures - Negative Consulted podiatry for amputation of the toe.  S/p amputation. POD -4; dressing changes  by podiatry yesterday. Discharge with Augmentin for 3 more days.  * Chronic diastolic congestive heart failure. No signs of fluid overload.  * Diabetes mellitus. oral hypoglycemics stopped. On sliding scale insulin.by mouth intake is poor.  * Dementia. Monitor for inpatient delirium.  * DVT prophylaxis . On eliquis  All the records are reviewed and case discussed with Care Management/Social Workerr. Management plans discussed with the patiets gaurdian 03/02/17 (award of state ) they are in agreement.  CODE STATUS: DNR  TOTAL TIME TAKING CARE OF THIS PATIENT:32 minutes.   Discharge tomorrow after dressing change  Dwaine Gale M.D on 03/01/2017 at 10:07 AM  Between 7am to 6pm - Pager - [phone]  After 6pm go to [email or web address] - password [email or web address] [facility]  [facility] [place] [facility]  Office  [phone]  CC: Primary care physician; [phone], MD  Note: This dictation was prepared with Dragon dictation along with smaller phrase technology. Any transcriptional errors that result from this process are unintentional.

## 2017-03-01 NOTE — Progress Notes (Signed)
Dressing not changed, pt. Refused.

## 2017-03-01 NOTE — Care Management (Signed)
Called patient's Guardian [facility] at [phone] to review IM information.  Unable to reach or leave a voicemail

## 2017-03-02 LAB — GLUCOSE, CAPILLARY
GLUCOSE-CAPILLARY: 79 mg/dL (ref 65–99)
GLUCOSE-CAPILLARY: 120 mg/dL — AB (ref 65–99)
Glucose-Capillary: 128 mg/dL — ABNORMAL HIGH (ref 65–99)
Glucose-Capillary: 195 mg/dL — ABNORMAL HIGH (ref 65–99)

## 2017-03-02 MED ORDER — METOPROLOL TARTRATE 25 MG PO TABS
25.0000 mg | ORAL_TABLET | Freq: Two times a day (BID) | ORAL | Status: DC
  Administered 2017-03-02 – 2017-03-03 (×2): 25 mg via ORAL
  Filled 2017-03-02 (×2): qty 1

## 2017-03-02 MED ORDER — POLYETHYLENE GLYCOL 3350 17 G PO PACK
17.0000 g | PACK | Freq: Every day | ORAL | Status: DC
  Administered 2017-03-03: 17 g via ORAL
  Filled 2017-03-02: qty 1

## 2017-03-02 NOTE — Addendum Note (Signed)
Addendum  created 03/02/17 1110 by Junious Silk, CRNA   Anesthesia Event edited

## 2017-03-02 NOTE — Addendum Note (Signed)
Addendum  created 03/02/17 1132 by Junious Silk, CRNA   Anesthesia Event edited

## 2017-03-02 NOTE — Care Management (Signed)
Confirmed with Tim from [facility] that patient is open with nursing for wound care

## 2017-03-02 NOTE — Progress Notes (Signed)
Seen by cardiology to follow uo AFIB RVR,patient remains stable this shift,total care and tolerating diet.

## 2017-03-02 NOTE — Consult Note (Signed)
[facility] Cardiology  CARDIOLOGY CONSULT NOTE  Patient ID: LAIYA TALLEY MRN: [id number] DOB/AGE: 12/12/1929 83 y.o.  Admit date: 02/19/2017 Referring Physician Gouru Primary Physician Dasanayaka, Newton Pigg, MD Primary Cardiologist Gwen Pounds Reason for Consultation Atrial fibrillation, hypotension  HPI: 83 year old female referred for evaluation of atrial fibrillation and hypotension.  The patient has a history of paroxysmal atrial fibrillation, on Eliquis for stroke prevention, chronic diastolic heart failure, status post pacemaker implantation, hyperlipidemia, essential hypertension, COPD type 2 diabetes, and dementia.  The patient was admitted to [facility] on 02/19/2017 for treatment of left second toe gangrene and sepsis.  The patient is status post toe amputation on 02/25/2017.  The patient was due to be discharged, but she was noted to be bradycardic by pulse check with rates around 46 bpm, as well as hypotension.  Her metoprolol dose was decreased, and Cardizem and digoxin were discontinued.  Upon review of telemetry strips, there is no evidence of bradycardia, but rather tachycardia. 2D echocardiogram on 12/09/2015 revealed normal left ventricular function, with LVEF 55-60%, with mild mitral regurgitation and mild to moderate tricuspid regurgitation with mild to moderately elevated pulmonary pressure. With patient's baseline dementia, she does not answer my questions beyond answering yes or no. She denies chest pain or palpitations.  The ROS difficult to assess due to patient's baseline dementia.    Past Medical History:  Diagnosis Date  . A-fib (HCC)   . Allergic rhinitis   . Alzheimer disease   . CAD (coronary artery disease)   . Cardiomyopathy (HCC)   . CHF (congestive heart failure) (HCC)   . COPD (chronic obstructive pulmonary disease) (HCC)   . Dementia   . Diabetes mellitus without complication (HCC)   . Diverticulosis   . Eczema   . Hyperlipemia   . Hypertension   . Urinary  incontinence     Past Surgical History:  Procedure Laterality Date  . AMPUTATION TOE Left 02/25/2017   Procedure: AMPUTATION TOE-LEFT 2ND TOE;  Surgeon: Recardo Evangelist, DPM;  Location: [facility] ORS;  Service: Podiatry;  Laterality: Left;  . HYSTEROTOMY    . none    . PACEMAKER PLACEMENT      Prescriptions Prior to Admission  Medication Sig Dispense Refill Last Dose  . albuterol (PROVENTIL HFA;VENTOLIN HFA) 108 (90 Base) MCG/ACT inhaler Inhale 2 puffs into the lungs every 6 (six) hours as needed for wheezing or shortness of breath.   02/19/2017 at Unknown time  . amLODipine (NORVASC) 5 MG tablet Take 5 mg by mouth daily.   02/19/2017 at Unknown time  . apixaban (ELIQUIS) 5 MG TABS tablet Take 1 tablet (5 mg total) by mouth 2 (two) times daily. 60 tablet 0 02/19/2017 at Unknown time  . [EXPIRED] cephALEXin (KEFLEX) 250 MG capsule Take 1 capsule (250 mg total) by mouth 4 (four) times daily. 40 capsule 0 02/19/2017 at Unknown time  . diclofenac sodium (VOLTAREN) 1 % GEL Apply 2 g topically 2 (two) times daily.   02/19/2017 at Unknown time  . diltiazem (CARDIZEM) 60 MG tablet Take 1 tablet (60 mg total) by mouth 2 (two) times daily. 60 tablet 0 02/19/2017 at Unknown time  . fexofenadine (ALLEGRA) 180 MG tablet Take 180 mg by mouth daily.   02/19/2017 at Unknown time  . fluticasone (FLONASE) 50 MCG/ACT nasal spray Place 1 spray into both nostrils daily.   02/19/2017 at Unknown time  . furosemide (LASIX) 40 MG tablet Take 40 mg by mouth daily.   02/19/2017 at Unknown time  . glipiZIDE (  GLUCOTROL) 5 MG tablet Take 5 mg by mouth daily.    02/19/2017 at Unknown time  . metFORMIN (GLUCOPHAGE) 1000 MG tablet Take 1,000 mg by mouth 2 (two) times daily with a meal.   02/19/2017 at Unknown time  . metoprolol tartrate (LOPRESSOR) 25 mg/10 mL SUSP Take 50 mg by mouth 2 (two) times daily.   02/19/2017 at Unknown time  . mirtazapine (REMERON) 7.5 MG tablet Take 7.5 mg by mouth at bedtime.   02/18/2017 at Unknown  time  . mupirocin ointment (BACTROBAN) 2 % Apply to affected area 3 times daily 22 g 0 02/19/2017 at Unknown time  . potassium chloride 20 MEQ/15ML (10%) SOLN Take 20 mEq by mouth daily.   02/19/2017 at Unknown time  . acetaminophen (TYLENOL) 500 MG tablet Take 1,000 mg by mouth 3 (three) times daily. Take for arthritis pain.   prn at prn  . ipratropium (ATROVENT) 0.02 % nebulizer solution Take 0.5 mg by nebulization daily.   prn at prn  . montelukast (SINGULAIR) 10 MG tablet Take 10 mg by mouth every morning.    prn at prn  . omeprazole (PRILOSEC) 20 MG capsule Take 20 mg by mouth 2 (two) times daily before a meal. Take 30 minutes before breakfast.   prn at prn  . polyethylene glycol (MIRALAX / GLYCOLAX) packet Take 17 g by mouth every other day.   prn at prn   Social History   Social History  . Marital status: Widowed    Spouse name: N/A  . Number of children: N/A  . Years of education: N/A   Occupational History  . Not on file.   Social History Main Topics  . Smoking status: Former Games developer  . Smokeless tobacco: Former Neurosurgeon  . Alcohol use No  . Drug use: No  . Sexual activity: Not on file   Other Topics Concern  . Not on file   Social History Narrative  . No narrative on file    Family History  Problem Relation Age of Onset  . Hypertension Father       Review of systems complete and found to be negative unless listed above      PHYSICAL EXAM  General: Frail, thin, elderly female in no acute distress, lying on her side in bed HEENT:  Normocephalic and atramatic Neck:  No JVD.  Lungs: Clear bilaterally to auscultation. Normal effort of breathing on room air. No wheezing or crackles. Heart: Tachycardic. Normal S1 and S2 without gallops or murmurs.  Abdomen: Bowel sounds are positive, abdomen soft  Msk: Patient lying in bed on side, gait not assessed. Normal strength and tone for age. Extremities: No clubbing, cyanosis or edema.  Left foot dressing in place. Neuro:  Alert, confused Psych: Confused.  Labs:   Lab Results  Component Value Date   WBC 6.1 02/23/2017   HGB 12.9 02/23/2017   HCT 37.6 02/23/2017   MCV 111.4 (H) 02/23/2017   PLT 364 02/23/2017    Recent Labs Lab 02/28/17 1158  NA 147*  K 4.3  CL 117*  CO2 24  BUN 13  CREATININE 0.73  CALCIUM 8.7*  GLUCOSE 218*   Lab Results  Component Value Date   TROPONINI <0.03 05/15/2016   No results found for: CHOL No results found for: HDL No results found for: LDLCALC No results found for: TRIG No results found for: CHOLHDL No results found for: LDLDIRECT    Radiology: Dg Foot Complete Left  Result Date: 02/19/2017 CLINICAL DATA:  Left second toe gangrene. EXAM: LEFT FOOT - COMPLETE 3+ VIEW COMPARISON:  Left second toe x-rays dated February 12, 2017. FINDINGS: The bones are diffusely osteopenic, which limits evaluation. No definite osteolysis or cortical destruction. No acute fracture or malalignment. Prominent hammertoe deformities of the second through fifth digits. Diffuse soft tissue swelling about the foot. IMPRESSION: No definite radiographic evidence of osteomyelitis. Diffuse soft tissue swelling about the foot heart HO. Electronically Signed   By: Obie Dredge M.D.   On: 02/19/2017 17:03   Dg Toe 2nd Left  Result Date: 02/12/2017 CLINICAL DATA:  Dorsal second toe laceration. Concern for osteomyelitis. EXAM: LEFT SECOND TOE COMPARISON:  None. FINDINGS: The bones are diffusely osteopenic, which limits evaluation. No definite osteolysis or cortical destruction. No acute fracture or malalignment. Degenerative changes of the first MTP joint. Soft tissues are unremarkable. IMPRESSION: Diffuse osteopenia, which limits evaluation. No definite radiographic evidence of osteomyelitis. No acute osseous abnormality. Electronically Signed   By: Obie Dredge M.D.   On: 02/12/2017 17:09    EKG: Atrial fibrillation, rates 98-122 bpm  ASSESSMENT AND PLAN:  1.  Paroxysmal atrial  fibrillation with bradycardia noted by pulse without evidence of bradycardia on telemetry strips, with rates in the upper 90s-150s, currently on a reduced dose of metoprolol tartrate (12.5 twice daily).  Cardizem and digoxin were recently discontinued due to bradycardia and hypotension.  2. Hypotension, appears to be asymptomatic 3. Chronic diastolic CHF, does not appear to be fluid overloaded 4. Status post pacemaker implantation with recent interrogation on 12/24/2016 revealing normal pacemaker function with underlying rhythm of atrial fibrillation with rates in the 80s, with a longevity of 2 years. 5.  Status post toe amputation 02/25/17  Recommendations: 1. Agree with overall therapy 2. Continue Eliquis for stroke prevention 3. Increase metoprolol to 25 mg BID due to tachycardia. Monitor BP closely.  4. Continue to hold Digoxin and Cardizem for now due to hypotension.   Signed: Leanora Ivanoff PA-C 03/02/2017, 11:06 AM

## 2017-03-02 NOTE — Progress Notes (Signed)
[facility] - [place] at [facility]   PATIENT NAME: [name] [name]    MR#:  [id number]  DATE OF BIRTH:  Sep 30, 1929  SUBJECTIVE; hypotensive, patient has atrial fibrillation but rate controlled.  CHIEF COMPLAINT:   Chief Complaint  Patient presents with  . Foot Pain   ch demented , in afib and hypotensive today.     REVIEW OF SYSTEMS:    Review of Systems  Unable to perform ROS: Dementia    DRUG ALLERGIES:  No Known Allergies  VITALS:  Blood pressure (!) 90/48, pulse 85, temperature 97.6 F (36.4 C), temperature source Oral, resp. rate 18, height 5\' 3"  (1.6 m), weight 61.2 kg (135 lb), SpO2 (!) 89 %.  PHYSICAL EXAMINATION:   Physical Exam  GENERAL:  83 y.o.-year-old patient lying in the bed with no acute distress.  EYES: Pupils equal, round, reactive to light and accommodation. No scleral icterus. Extraocular muscles intact.  HEENT: Head atraumatic, normocephalic. Oropharynx and nasopharynx clear.  NECK:  Supple, no jugular venous distention. No thyroid enlargement, no tenderness.  LUNGS: Normal breath sounds bilaterally, no wheezing, rales, rhonchi. No use of accessory muscles of respiration.  CARDIOVASCULAR: S1, S2 tachycardia, irregular. No murmurs, rubs, or gallops.  ABDOMEN: Soft, nontender, nondistended. Bowel sounds present. No organomegaly or mass.  EXTREMITIES: No cyanosis, clubbing or edema b/l.    NEUROLOGIC: Moves all 4 extremities  PSYCHIATRIC: The patient is confused SKIN: Left foot dressing.dressing is changed  last night.  LABORATORY PANEL:   CBC No results for input(s): WBC, HGB, HCT, PLT in the last 168 hours. ------------------------------------------------------------------------------------------------------------------ Chemistries   Recent Labs Lab 02/28/17 1158  NA 147*  K 4.3  CL 117*  CO2 24  GLUCOSE 218*  BUN 13  CREATININE 0.73  CALCIUM 8.7*  MG 2.2    ------------------------------------------------------------------------------------------------------------------  Cardiac Enzymes No results for input(s): TROPONINI in the last 168 hours. ------------------------------------------------------------------------------------------------------------------  RADIOLOGY:  No results found.   ASSESSMENT AND PLAN:   * Paroxysmal atrial fibrillation. Hypotension , bradycardic too,  heart rate at around 46' discontiued  Cardizem and digoxin,patient is still on metoprolol  Hypotensive and in A.fib tday. consut placed to cardiolgy-kc   * Hypokalemia- resolved; labs today.  * Left second toe gangrene with cellulitis. Sepsis present on admission.  IV Zosyn.  blood cultures - Negative Consulted podiatry for amputation of the toe.  S/p amputation. POD -5; dressing changes  by podiatry yesterday. Discharge with Augmentin for 2 more days.  * Chronic diastolic congestive heart failure. No signs of fluid overload.  * Diabetes mellitus. oral hypoglycemics stopped. On sliding scale insulin.by mouth intake is poor.  * Dementia. Monitor for inpatient delirium.  * DVT prophylaxis . On eliquis  All the records are reviewed and case discussed with Care Management/Social Workerr. Management plans discussed with the patiets gaurdian 03/02/17 (award of state ) they are in agreement.  CODE STATUS: DNR  TOTAL TIME TAKING CARE OF THIS PATIENT:32 minutes.   Discharge tomorrow after dressing change  Dwaine Gale M.D on 03/02/2017 at 10:03 AM  Between 7am to 6pm - Pager - [phone]  After 6pm go to [email or web address] - password [email or web address] [facility]  [facility] [facility]  Office  [phone]  CC: Primary care physician; [phone], MD  Note: This dictation was prepared with Dragon dictation along with smaller phrase technology. Any transcriptional errors that result from this process are unintentional.

## 2017-03-03 LAB — GLUCOSE, CAPILLARY
Glucose-Capillary: 112 mg/dL — ABNORMAL HIGH (ref 65–99)
Glucose-Capillary: 223 mg/dL — ABNORMAL HIGH (ref 65–99)
Glucose-Capillary: 148 mg/dL — ABNORMAL HIGH (ref 65–99)

## 2017-03-03 MED ORDER — METOPROLOL TARTRATE 25 MG PO TABS: 25.0000 mg | ORAL_TABLET | Freq: Two times a day (BID) | ORAL | 0 refills | Status: DC

## 2017-03-03 MED ORDER — APIXABAN 2.5 MG PO TABS: 2.5000 mg | ORAL_TABLET | Freq: Two times a day (BID) | ORAL | Status: DC

## 2017-03-03 MED ORDER — POLYETHYLENE GLYCOL 3350 17 G PO PACK: 17.0000 g | PACK | Freq: Every day | ORAL | 0 refills | Status: AC

## 2017-03-03 MED ORDER — APIXABAN 2.5 MG PO TABS: 2.5000 mg | ORAL_TABLET | Freq: Two times a day (BID) | ORAL | 0 refills | Status: DC

## 2017-03-03 MED ORDER — TRAMADOL HCL 50 MG PO TABS
50.0000 mg | ORAL_TABLET | Freq: Four times a day (QID) | ORAL | 0 refills | Status: AC | PRN
Start: 2017-03-03 — End: ?

## 2017-03-03 NOTE — Care Management Important Message (Signed)
Important Message  Patient Details  Name: [name] [name] MRN: [id number] Date of Birth: 12-03-1929   Medicare Important Message Given:  Yes There is no signed notice in the medical record for this admission.  Faxed notice to patient's legal guardian - Lazaro Arms  requesting review - signature and return.  Placed notice and confirmation of fax in the paper chart.  Left vm message for guardian   Kennetha, Pear[place], RN 03/03/2017, 2:14 PM

## 2017-03-03 NOTE — Progress Notes (Signed)
[facility] Cardiology  SUBJECTIVE: Patient does not verbally respond due to baseline dementia. No obvious distress, resting comfortably.   ROS unable to be completed due to patient's baseline dementia.  Vitals:   03/03/17 0529 03/03/17 0802 03/03/17 0942 03/03/17 1009  BP: 94/79 (!) 91/53  104/68  Pulse: 100 74 80 67  Resp:  18    Temp: 97.6 F (36.4 C) 98.4 F (36.9 C) 98.1 F (36.7 C)   TempSrc: Oral Oral Oral   SpO2: 99% 100% 100%   Weight: 60.8 kg (134 lb)     Height:         Intake/Output Summary (Last 24 hours) at 03/03/17 1308 Last data filed at 03/03/17 0400  Gross per 24 hour  Intake                0 ml  Output                0 ml  Net                0 ml      PHYSICAL EXAM  General: Frail, elderly female, lying in bed, in no acute distress HEENT:  Normocephalic and atramatic Neck:  No JVD.  Lungs: Normal effort of breathing on room air. Heart: HRRR . Normal S1 and S2 without gallops or murmurs.  Abdomen: Bowel sounds are positive Extremities: No clubbing, cyanosis or edema.   Neuro: Alert. Confused. Psych:  Confused.   LABS: Basic Metabolic Panel: No results for input(s): NA, K, CL, CO2, GLUCOSE, BUN, CREATININE, CALCIUM, MG, PHOS in the last 72 hours. Liver Function Tests: No results for input(s): AST, ALT, ALKPHOS, BILITOT, PROT, ALBUMIN in the last 72 hours. No results for input(s): LIPASE, AMYLASE in the last 72 hours. CBC: No results for input(s): WBC, NEUTROABS, HGB, HCT, MCV, PLT in the last 72 hours. Cardiac Enzymes: No results for input(s): CKTOTAL, CKMB, CKMBINDEX, TROPONINI in the last 72 hours. BNP: Invalid input(s): POCBNP D-Dimer: No results for input(s): DDIMER in the last 72 hours. Hemoglobin A1C: No results for input(s): HGBA1C in the last 72 hours. Fasting Lipid Panel: No results for input(s): CHOL, HDL, LDLCALC, TRIG, CHOLHDL, LDLDIRECT in the last 72 hours. Thyroid Function Tests: No results for input(s): TSH, T4TOTAL, T3FREE,  THYROIDAB in the last 72 hours.  Invalid input(s): FREET3 Anemia Panel: No results for input(s): VITAMINB12, FOLATE, FERRITIN, TIBC, IRON, RETICCTPCT in the last 72 hours.  No results found.   TELEMETRY: Sinus rhythm, with rates in the 60s with intermittent pacing and occasional PACs  ASSESSMENT AND PLAN:  Active Problems:   Gangrene of toe of left foot (HCC)   Pressure injury of skin   1.  Paroxysmal atrial fibrillation with bradycardia noted by pulse without evidence of bradycardia on telemetry strips, with rates in the upper 90s-150s. Increased dose of metoprolol from 12.5 mg BID to 25 mg BID. Rate better controlled now, in the 60s. Cardizem and digoxin being held at this time due to hypotension.  2. Hypotension, better this morning 3. Chronic diastolic CHF, does not appear to be fluid overloaded 4. Status post pacemaker implantation with recent interrogation on 12/24/2016 revealing normal pacemaker function with underlying rhythm of atrial fibrillation with rates in the 80s, with a longevity of 2 years. 5.  Status post toe amputation 02/25/17  Recommendations: 1. Agree with overall therapy 2. Continue Eliquis for stroke prevention, however reduce dose to 2.5 mg daily due to age >80 years, and frailty with weight  60 kg. 3. Continue metoprolol 25 mg BID for now; continue to closely monitor BP 4. Continue to hold Digoxin and Cardizem for now due to hypotension.  Leanora Ivanoff, PA-C 03/03/2017 1:08 PM

## 2017-03-03 NOTE — NC FL2 (Signed)
[place] MEDICAID FL2 LEVEL OF CARE SCREENING TOOL     IDENTIFICATION  Patient Name: [name] [name] Birthdate: 1930/05/08 Sex: female Admission Date (Current Location): 02/19/2017  [place] and [place] Number:  [address] [id number] [facility] Facility and Address:  [facility], [address], [place], [place] [address]      Provider Number: [id number]  Attending Physician Name and Address:  Ramonita Lab, MD  Relative Name and Phone Number:  DSS Guardian Lazaro Arms [phone] or [phone])    Current Level of Care: Hospital Recommended Level of Care: Assisted Living Facility Prior Approval Number:    Date Approved/Denied:   PASRR Number: [id number]  Discharge Plan: Domiciliary (Rest home)    Current Diagnoses: Patient Active Problem List   Diagnosis Date Noted  . Pressure injury of skin 02/20/2017  . Gangrene of toe of left foot (HCC) 02/19/2017  . Sinus tachycardia 12/09/2015    Orientation RESPIRATION BLADDER Height & Weight     Self  Normal Incontinent Weight: 134 lb (60.8 kg) Height:  5\' 3"  (160 cm)  BEHAVIORAL SYMPTOMS/MOOD NEUROLOGICAL BOWEL NUTRITION STATUS      Incontinent Diet (Dysphagia 1)  AMBULATORY STATUS COMMUNICATION OF NEEDS Skin   Total Care Verbally Surgical wounds                       Personal Care Assistance Level of Assistance  Bathing, Feeding, Dressing, Total care Bathing Assistance: Maximum assistance Feeding assistance: Maximum assistance Dressing Assistance: Maximum assistance Total Care Assistance: Maximum assistance   Functional Limitations Info  Sight, Hearing, Speech Sight Info: Adequate Hearing Info: Adequate Speech Info: Adequate    SPECIAL CARE FACTORS FREQUENCY                       Contractures Contractures Info: Not present    Additional Factors Info  Code Status, Allergies Code Status Info: DNR Allergies Info: NKA Psychotropic Info: mirtazapine (REMERON) tablet 7.5 mg Insulin  Sliding Scale Info: insulin aspart (novoLOG) injection 0-9 Units 3x a day with meals       Current Medications (03/03/2017):  This is the current hospital active medication list Current Facility-Administered Medications  Medication Dose Route Frequency Provider Last Rate Last Dose  . acetaminophen (TYLENOL) tablet 650 mg  650 mg Oral Q6H PRN 03/05/2017, MD       Or  . acetaminophen (TYLENOL) suppository 650 mg  650 mg Rectal Q6H PRN Sudini, Srikar, MD      . albuterol (PROVENTIL) (2.5 MG/3ML) 0.083% nebulizer solution 2.5 mg  2.5 mg Nebulization Q2H PRN Sudini, Milagros Loll, MD      . amoxicillin-clavulanate (AUGMENTIN) 875-125 MG per tablet 1 tablet  1 tablet Oral Q12H Blondina Coderre, MD   1 tablet at 03/03/17 1002  . apixaban (ELIQUIS) tablet 2.5 mg  2.5 mg Oral BID 03/05/17, PA-C      . bisacodyl (DULCOLAX) suppository 10 mg  10 mg Rectal Daily PRN Leanora Ivanoff, MD      . diltiazem (CARDIZEM) injection 5 mg  5 mg Intravenous Once Milagros Loll, MD      . docusate sodium (COLACE) capsule 100 mg  100 mg Oral BID Altamese Dilling, MD   100 mg at 03/03/17 1002  . feeding supplement (ENSURE ENLIVE) (ENSURE ENLIVE) liquid 237 mL  237 mL Oral TID BM Sudini, Srikar, MD   237 mL at 03/03/17 1354  . insulin aspart (novoLOG) injection 0-9 Units  0-9 Units Subcutaneous TID WC  Milagros Loll, MD   3 Units at 03/03/17 1156  . loratadine (CLARITIN) tablet 10 mg  10 mg Oral Daily Milagros Loll, MD   10 mg at 03/03/17 1002  . magnesium oxide (MAG-OX) tablet 400 mg  400 mg Oral Daily Milagros Loll, MD   400 mg at 03/03/17 1003  . metoprolol tartrate (LOPRESSOR) tablet 25 mg  25 mg Oral BID Leanora Ivanoff, PA-C   25 mg at 03/03/17 1010  . mirtazapine (REMERON) tablet 7.5 mg  7.5 mg Oral QHS Milagros Loll, MD   7.5 mg at 03/02/17 2153  . multivitamin with minerals tablet 1 tablet  1 tablet Oral Daily Milagros Loll, MD   1 tablet at 03/03/17 1002  . ondansetron (ZOFRAN) tablet 4 mg  4 mg Oral Q6H PRN  Milagros Loll, MD       Or  . ondansetron (ZOFRAN) injection 4 mg  4 mg Intravenous Q6H PRN Milagros Loll, MD   4 mg at 02/25/17 1411  . polyethylene glycol (MIRALAX / GLYCOLAX) packet 17 g  17 g Oral Daily PRN Sudini, Srikar, MD      . polyethylene glycol (MIRALAX / GLYCOLAX) packet 17 g  17 g Oral Daily Cord Wilczynski, MD   17 g at 03/03/17 1003  . potassium chloride 20 MEQ/15ML (10%) solution 20 mEq  20 mEq Oral Daily Milagros Loll, MD   20 mEq at 03/03/17 1002  . sodium chloride flush (NS) 0.9 % injection 3 mL  3 mL Intravenous Q12H Katha Hamming, MD   3 mL at 03/03/17 1003  . traMADol (ULTRAM) tablet 50 mg  50 mg Oral Q6H PRN Milagros Loll, MD   50 mg at 03/01/17 1696     Discharge Medications: Please see discharge summary for a list of discharge medications.  Current Discharge Medication List        START taking these medications   Details  amoxicillin-clavulanate (AUGMENTIN) 250-62.5 MG/5ML suspension Take 10 mLs (500 mg total) by mouth 2 (two) times daily. Qty: 100 mL, Refills: 0    digoxin (LANOXIN) 0.125 MG tablet Take 1 tablet (0.125 mg total) by mouth daily. Qty: 30 tablet, Refills: 0          CONTINUE these medications which have NOT CHANGED   Details  albuterol (PROVENTIL HFA;VENTOLIN HFA) 108 (90 Base) MCG/ACT inhaler Inhale 2 puffs into the lungs every 6 (six) hours as needed for wheezing or shortness of breath.    apixaban (ELIQUIS) 5 MG TABS tablet Take 1 tablet (5 mg total) by mouth 2 (two) times daily. Qty: 60 tablet, Refills: 0    diclofenac sodium (VOLTAREN) 1 % GEL Apply 2 g topically 2 (two) times daily.    diltiazem (CARDIZEM) 60 MG tablet Take 1 tablet (60 mg total) by mouth 2 (two) times daily. Qty: 60 tablet, Refills: 0    fexofenadine (ALLEGRA) 180 MG tablet Take 180 mg by mouth daily.    fluticasone (FLONASE) 50 MCG/ACT nasal spray Place 1 spray into both nostrils daily.    glipiZIDE (GLUCOTROL) 5 MG tablet Take 5 mg by  mouth daily.     metFORMIN (GLUCOPHAGE) 1000 MG tablet Take 1,000 mg by mouth 2 (two) times daily with a meal.    metoprolol tartrate (LOPRESSOR) 25 mg/10 mL SUSP Take 50 mg by mouth 2 (two) times daily.    mirtazapine (REMERON) 7.5 MG tablet Take 7.5 mg by mouth at bedtime.    mupirocin ointment (BACTROBAN) 2 % Apply to affected area 3  times daily Qty: 22 g, Refills: 0    potassium chloride 20 MEQ/15ML (10%) SOLN Take 20 mEq by mouth daily.    acetaminophen (TYLENOL) 500 MG tablet Take 1,000 mg by mouth 3 (three) times daily. Take for arthritis pain.    ipratropium (ATROVENT) 0.02 % nebulizer solution Take 0.5 mg by nebulization daily.    montelukast (SINGULAIR) 10 MG tablet Take 10 mg by mouth every morning.     omeprazole (PRILOSEC) 20 MG capsule Take 20 mg by mouth 2 (two) times daily before a meal. Take 30 minutes before breakfast.    polyethylene glycol (MIRALAX / GLYCOLAX) packet Take 17 g by mouth every other day.         STOP taking these medications     amLODipine (NORVASC) 5 MG tablet      cephALEXin (KEFLEX) 250 MG capsule      furosemide (LASIX) 40 MG tablet          Relevant Imaging Results:  Relevant Lab Results:   Additional Information [id number]  Darleene Cleaver, [place]

## 2017-03-03 NOTE — Discharge Summary (Addendum)
[facility] - [place] at [facility]   PATIENT NAME: [name] [name]    MR#:  [id number]  DATE OF BIRTH:  1929-09-23  DATE OF ADMISSION:  02/19/2017 ADMITTING PHYSICIAN: Milagros Loll, MD  DATE OF DISCHARGE:  03/03/17   PRIMARY CARE PHYSICIAN: Angela Cox, MD    ADMISSION DIAGNOSIS:  Cellulitis of toe of right foot [L03.031]  DISCHARGE DIAGNOSIS:  Active Problems:   Gangrene of toe of left foot (HCC)   Pressure injury of skin   SECONDARY DIAGNOSIS:   Past Medical History:  Diagnosis Date  . A-fib (HCC)   . Allergic rhinitis   . Alzheimer disease   . CAD (coronary artery disease)   . Cardiomyopathy (HCC)   . CHF (congestive heart failure) (HCC)   . COPD (chronic obstructive pulmonary disease) (HCC)   . Dementia   . Diabetes mellitus without complication (HCC)   . Diverticulosis   . Eczema   . Hyperlipemia   . Hypertension   . Urinary incontinence     HOSPITAL COURSE:  HPI  [name] [name]  is a 83 y.o. female with a known history of COPD, diabetes, CHF, atrial fibrillation, dementia, wheelchair bound presents from assisted living facility due to left third toe gangrene. Patient was recently seen in the emergency room and referred to wound care center. She was seen in the wound care center and sent back to the emergency room for amputation. Here patient has been found to be tachycardic in the 140s along with lactic acid of 3.2 sepsis and is being admitted to the hospital. Patient has dementia and unable to provide any history. She is DO NOT RESUSCITATE. History obtained from her caretaker from assisted living facility  * Paroxysmal atrial fibrillation. Hypotension and bradycardia improved after discontinuing  Cardizem and digoxin Metoprolol dose increased to 20 mg by mouth twice a day   appreciate cardiology recommendations and outpatient follow-up with cardiolgy-kc   * Hypokalemia- resolved;  * Left second toe gangrene with cellulitis.  Sepsis present on admission.  IV Zosyn changed to by mouth Augmentin and patient completed the antibiotic course blood cultures - Negative  podiatry was consulted and Dr. Orland Jarred amputated .  S/p amputation. POD -6; dressing changes  by podiatry . recommend dressing changes approximately every 3 days and return to Podiatry  office in about 2 weeks for reevaluation and suture removal; completed abx course   * Chronic diastolic congestive heart failure. No signs of fluid overload.  * Diabetes mellitus. oral hypoglycemics stopped. On sliding scale insulin.by mouth intake is poor.  * Dementia. Monitor for inpatient delirium.  * DVT prophylaxis . On eliquis  *Constipation MiraLAX as needed   DISCHARGE CONDITIONS:   fair  CONSULTS OBTAINED:  Treatment Team:  Schnier, Latina Craver, MD Paraschos, Alexander, MD   PROCEDURES left toe amputation  DRUG ALLERGIES:  No Known Allergies  DISCHARGE MEDICATIONS:   Current Discharge Medication List    START taking these medications   Details  metoprolol tartrate (LOPRESSOR) 25 MG tablet Take 1 tablet (25 mg total) by mouth 2 (two) times daily. Qty: 60 tablet, Refills: 0    traMADol (ULTRAM) 50 MG tablet Take 1 tablet (50 mg total) by mouth every 6 (six) hours as needed for moderate pain. Qty: 30 tablet, Refills: 0      CONTINUE these medications which have CHANGED   Details  apixaban (ELIQUIS) 2.5 MG TABS tablet Take 1 tablet (2.5 mg total) by mouth 2 (two) times daily. Qty:  60 tablet, Refills: 0    polyethylene glycol (MIRALAX / GLYCOLAX) packet Take 17 g by mouth daily. Qty: 14 each, Refills: 0      CONTINUE these medications which have NOT CHANGED   Details  albuterol (PROVENTIL HFA;VENTOLIN HFA) 108 (90 Base) MCG/ACT inhaler Inhale 2 puffs into the lungs every 6 (six) hours as needed for wheezing or shortness of breath.    diclofenac sodium (VOLTAREN) 1 % GEL Apply 2 g topically 2 (two) times daily.    fexofenadine  (ALLEGRA) 180 MG tablet Take 180 mg by mouth daily.    fluticasone (FLONASE) 50 MCG/ACT nasal spray Place 1 spray into both nostrils daily.    glipiZIDE (GLUCOTROL) 5 MG tablet Take 5 mg by mouth daily.     metFORMIN (GLUCOPHAGE) 1000 MG tablet Take 1,000 mg by mouth 2 (two) times daily with a meal.    metoprolol tartrate (LOPRESSOR) 25 mg/10 mL SUSP Take 50 mg by mouth 2 (two) times daily.    mirtazapine (REMERON) 7.5 MG tablet Take 7.5 mg by mouth at bedtime.    mupirocin ointment (BACTROBAN) 2 % Apply to affected area 3 times daily Qty: 22 g, Refills: 0    potassium chloride 20 MEQ/15ML (10%) SOLN Take 20 mEq by mouth daily.    acetaminophen (TYLENOL) 500 MG tablet Take 1,000 mg by mouth 3 (three) times daily. Take for arthritis pain.    ipratropium (ATROVENT) 0.02 % nebulizer solution Take 0.5 mg by nebulization daily.    montelukast (SINGULAIR) 10 MG tablet Take 10 mg by mouth every morning.     omeprazole (PRILOSEC) 20 MG capsule Take 20 mg by mouth 2 (two) times daily before a meal. Take 30 minutes before breakfast.      STOP taking these medications     amLODipine (NORVASC) 5 MG tablet      cephALEXin (KEFLEX) 250 MG capsule      diltiazem (CARDIZEM) 60 MG tablet      furosemide (LASIX) 40 MG tablet          DISCHARGE INSTRUCTIONS:   Resume dysphagia diet and activity as before; needs feeding assistance Home health PT and RN, needs dressing changes as recommended by podiatry 3 times in a week Outpatient follow-up with podiatry in 1-2 weeks for suture removal and reassessment Follow-up with primary care physician in 5-7days or sooner as needed Recommended dressing change every other day with sterile 4 x 4's and Kerlix wrap  DIET:  Dysphagia - 1 diet , feeding supplements  DISCHARGE CONDITION:  Fair  ACTIVITY:  Activity as tolerated  OXYGEN:  Home Oxygen: No.   Oxygen Delivery: room air  DISCHARGE LOCATION:  Assisted living facility   If you  experience worsening of your admission symptoms, develop shortness of breath, life threatening emergency, suicidal or homicidal thoughts you must seek medical attention immediately by calling 911 or calling your MD immediately  if symptoms less severe.  You Must read complete instructions/literature along with all the possible adverse reactions/side effects for all the Medicines you take and that have been prescribed to you. Take any new Medicines after you have completely understood and accpet all the possible adverse reactions/side effects.   Please note  You were cared for by a hospitalist during your hospital stay. If you have any questions about your discharge medications or the care you received while you were in the hospital after you are discharged, you can call the unit and asked to speak with the hospitalist on call  if the hospitalist that took care of you is not available. Once you are discharged, your primary care physician will handle any further medical issues. Please note that NO REFILLS for any discharge medications will be authorized once you are discharged, as it is imperative that you return to your primary care physician (or establish a relationship with a primary care physician if you do not have one) for your aftercare needs so that they can reassess your need for medications and monitor your lab values.     Today  Chief Complaint  Patient presents with  . Foot Pain   Patient is resting comfortably.   ROS: Unobtainable as the patient is demented  VITAL SIGNS:  Blood pressure 104/68, pulse 67, temperature 98.1 F (36.7 C), temperature source Oral, resp. rate 18, height 5\' 3"  (1.6 m), weight 60.8 kg (134 lb), SpO2 100 %.  I/O:    Intake/Output Summary (Last 24 hours) at 03/03/17 1456 Last data filed at 03/03/17 0400  Gross per 24 hour  Intake                0 ml  Output                0 ml  Net                0 ml    PHYSICAL EXAMINATION:   GENERAL:  83  y.o.-year-old patient lying in the bed with no acute distress.  LUNGS: Normal breath sounds bilaterally, no wheezing, rales,rhonchi or crepitation. No use of accessory muscles of respiration.  CARDIOVASCULAR: S1, S2 normal. No murmurs, rubs, or gallops.  ABDOMEN: Soft, non-tender, non-distended. Bowel sounds present.  NEUROLOGIC: Moves all 4 extremities. Extremitis: status post amputation for gangrenous second toe left foot, Left foot dressing clean PSYCHIATRC: The patient is pleasantly confused, demeted  DATA REVIEW:   CBC No results for input(s): WBC, HGB, HCT, PLT in the last 168 hours.  Chemistries   Recent Labs Lab 02/28/17 1158  NA 147*  K 4.3  CL 117*  CO2 24  GLUCOSE 218*  BUN 13  CREATININE 0.73  CALCIUM 8.7*  MG 2.2    Cardiac Enzymes No results for input(s): TROPONINI in the last 168 hours.  Microbiology Results  Results for orders placed or performed during the hospital encounter of 02/19/17  Blood culture (routine x 2)     Status: None   Collection Time: 02/19/17 12:35 PM  Result Value Ref Range Status   Specimen Description BLOOD BLOOD RIGHT HAND  Final   Special Requests   Final    BOTTLES DRAWN AEROBIC AND ANAEROBIC Blood Culture results may not be optimal due to an inadequate volume of blood received in culture bottles   Culture NO GROWTH 5 DAYS  Final   Report Status 02/24/2017 FINAL  Final  Blood culture (routine x 2)     Status: None   Collection Time: 02/19/17  4:41 PM  Result Value Ref Range Status   Specimen Description BLOOD ARM RIGHT UPPER ARM  Final   Special Requests   Final    BOTTLES DRAWN AEROBIC AND ANAEROBIC Blood Culture adequate volume   Culture NO GROWTH 5 DAYS  Final   Report Status 02/24/2017 FINAL  Final  MRSA PCR Screening     Status: None   Collection Time: 02/19/17  7:54 PM  Result Value Ref Range Status   MRSA by PCR NEGATIVE NEGATIVE Final    Comment:  The GeneXpert MRSA Assay (FDA approved for NASAL  specimens only), is one component of a comprehensive MRSA colonization surveillance program. It is not intended to diagnose MRSA infection nor to guide or monitor treatment for MRSA infections.     RADIOLOGY:  No results found.  EKG:   Orders placed or performed during the hospital encounter of 05/15/16  . ED EKG  . ED EKG  . EKG 12-Lead  . EKG 12-Lead  . EKG      Management plans discussed with the patient, family and they are in agreement.  CODE STATUS:     Code Status Orders        Start     Ordered   02/19/17 1840  Do not attempt resuscitation (DNR)  Continuous    Question Answer Comment  In the event of cardiac or respiratory ARREST Do not call a "code blue"   In the event of cardiac or respiratory ARREST Do not perform Intubation, CPR, defibrillation or ACLS   In the event of cardiac or respiratory ARREST Use medication by any route, position, wound care, and other measures to relive pain and suffering. May use oxygen, suction and manual treatment of airway obstruction as needed for comfort.      02/19/17 1841    Code Status History    Date Active Date Inactive Code Status Order ID Comments User Context   12/10/2015  8:33 AM 12/11/2015  6:33 PM DNR [id number]  Delfino LovettShah, Vipul, MD Inpatient   12/09/2015  3:00 PM 12/09/2015  5:18 PM Full Code [id number]  Auburn BilberryPatel, Shreyang, MD ED    Advance Directive Documentation     Most Recent Value  Type of Advance Directive  Out of facility DNR (pink MOST or yellow form)  Pre-existing out of facility DNR order (yellow form or pink MOST form)  Yellow form placed in chart (order not valid for inpatient use)  "MOST" Form in Place?  -      TOTAL TIME TAKING CARE OF THIS PATIENT: 43 minutes.   Note: This dictation was prepared with Dragon dictation along with smaller phrase technology. Any transcriptional errors that result from this process are unintentional.   @[facility] @  on 03/03/2017 at 2:56 PM  Between 7am to 6pm - Pager -  [phone]  After 6pm go to [email or web address] - password [email or web address] [facility]  [facility]  Office  [phone]  CC: Primary care physician; Angela Coxasanayaka, Gayani Y, MD

## 2017-03-03 NOTE — Progress Notes (Signed)
Patient is discharge to [facility] in a stable condition, summary and f/u care given top floor nurse , awaiting pick up by EMS

## 2017-03-03 NOTE — Clinical Social Work Note (Signed)
Patient to be d/c'ed today to [facility].  Patient and guardian agreeable to plans will transport via ems RN to call [phone].  Windell Moulding, MSW, Theresia Majors [phone]

## 2017-03-10 ENCOUNTER — Emergency Department: Payer: Medicare Other

## 2017-03-10 ENCOUNTER — Emergency Department
Admission: EM | Admit: 2017-03-10 | Discharge: 2017-03-10 | Disposition: A | Discharge status: Home / Self Care | Payer: Medicare Other | Attending: Emergency Medicine | Admitting: Emergency Medicine

## 2017-03-10 DIAGNOSIS — Y939 Activity, unspecified: Secondary | ICD-10-CM | POA: Insufficient documentation

## 2017-03-10 DIAGNOSIS — Z950 Presence of cardiac pacemaker: Secondary | ICD-10-CM | POA: Diagnosis not present

## 2017-03-10 DIAGNOSIS — R040 Epistaxis: Secondary | ICD-10-CM

## 2017-03-10 DIAGNOSIS — Y999 Unspecified external cause status: Secondary | ICD-10-CM | POA: Diagnosis not present

## 2017-03-10 DIAGNOSIS — Z79899 Other long term (current) drug therapy: Secondary | ICD-10-CM | POA: Insufficient documentation

## 2017-03-10 DIAGNOSIS — S0990XA Unspecified injury of head, initial encounter: Secondary | ICD-10-CM | POA: Insufficient documentation

## 2017-03-10 DIAGNOSIS — I2510 Atherosclerotic heart disease of native coronary artery without angina pectoris: Secondary | ICD-10-CM | POA: Insufficient documentation

## 2017-03-10 DIAGNOSIS — E119 Type 2 diabetes mellitus without complications: Secondary | ICD-10-CM | POA: Diagnosis not present

## 2017-03-10 DIAGNOSIS — I10 Essential (primary) hypertension: Secondary | ICD-10-CM | POA: Diagnosis not present

## 2017-03-10 DIAGNOSIS — W19XXXA Unspecified fall, initial encounter: Secondary | ICD-10-CM

## 2017-03-10 DIAGNOSIS — Y92129 Unspecified place in nursing home as the place of occurrence of the external cause: Secondary | ICD-10-CM | POA: Diagnosis not present

## 2017-03-10 DIAGNOSIS — J449 Chronic obstructive pulmonary disease, unspecified: Secondary | ICD-10-CM | POA: Insufficient documentation

## 2017-03-10 DIAGNOSIS — G308 Other Alzheimer's disease: Secondary | ICD-10-CM | POA: Insufficient documentation

## 2017-03-10 DIAGNOSIS — M79642 Pain in left hand: Secondary | ICD-10-CM

## 2017-03-10 DIAGNOSIS — F0280 Dementia in other diseases classified elsewhere without behavioral disturbance: Secondary | ICD-10-CM | POA: Insufficient documentation

## 2017-03-10 DIAGNOSIS — W051XXA Fall from non-moving nonmotorized scooter, initial encounter: Secondary | ICD-10-CM | POA: Diagnosis not present

## 2017-03-10 MED ORDER — OXYMETAZOLINE HCL 0.05 % NA SOLN
NASAL | Status: AC
  Filled 2017-03-10: qty 15

## 2017-03-10 NOTE — ED Notes (Signed)
C/o left hand pain since fall. No reported LOC. Pt hx of dementia at baseline. Full ROM of left hand. Nose bleed that has stopped at this time. Patient arms and face wiped down from dried blood.

## 2017-03-10 NOTE — ED Triage Notes (Signed)
Arrives from [facility] via [facility] after fall from wheelchair. Unsure if fall was witnessed. EMS reports that pt did not have any LOC. Left hand pain. Nose bleed that has stopped at present time. Pt VSS with EMS. CBG WNL with EMS. Dementia at baseline. Pt in NAD at this time. RR even and unlabored, color WNL.

## 2017-03-10 NOTE — ED Notes (Signed)
Called EMS for transport to [facility]   [phone]

## 2017-03-10 NOTE — ED Provider Notes (Signed)
[facility] Emergency Department Provider Note  ____________________________________________  Time seen: Approximately 3:18 PM  I have reviewed the triage vital signs and the nursing notes.   HISTORY  Chief Complaint Fall  Level 5 caveat:  Portions of the history and physical were unable to be obtained due to dementia   HPI [name] [name] is a 83 y.o. female with a history of Alzheimer's, atrial fibrillation on Eliquis who presents for evaluation of a fall. Patient coming from her nursing home after falling from the wheelchair. No LOC. Patient did hit her face on the ground. Patient had epistaxis in route. Patient is complaining of pain in her left hand. Patient denies headache or neck pain, back pain, chest pain, abdominal pain.  Past Medical History:  Diagnosis Date  . A-fib (HCC)   . Allergic rhinitis   . Alzheimer disease   . CAD (coronary artery disease)   . Cardiomyopathy (HCC)   . CHF (congestive heart failure) (HCC)   . COPD (chronic obstructive pulmonary disease) (HCC)   . Dementia   . Diabetes mellitus without complication (HCC)   . Diverticulosis   . Eczema   . Hyperlipemia   . Hypertension   . Urinary incontinence     Patient Active Problem List   Diagnosis Date Noted  . Pressure injury of skin 02/20/2017  . Gangrene of toe of left foot (HCC) 02/19/2017  . Sinus tachycardia 12/09/2015    Past Surgical History:  Procedure Laterality Date  . AMPUTATION TOE Left 02/25/2017   Procedure: AMPUTATION TOE-LEFT 2ND TOE;  Surgeon: Recardo Evangelist, DPM;  Location: [facility] ORS;  Service: Podiatry;  Laterality: Left;  . HYSTEROTOMY    . none    . PACEMAKER PLACEMENT      Prior to Admission medications   Medication Sig Start Date End Date Taking? Authorizing Provider  acetaminophen (TYLENOL) 500 MG tablet Take 1,000 mg by mouth 3 (three) times daily. Take for arthritis pain.    [provider]  albuterol (PROVENTIL HFA;VENTOLIN  HFA) 108 (90 Base) MCG/ACT inhaler Inhale 2 puffs into the lungs every 6 (six) hours as needed for wheezing or shortness of breath.    [provider]  apixaban (ELIQUIS) 2.5 MG TABS tablet Take 1 tablet (2.5 mg total) by mouth 2 (two) times daily. 03/03/17   Ramonita Lab, MD  diclofenac sodium (VOLTAREN) 1 % GEL Apply 2 g topically 2 (two) times daily.    [provider]  fexofenadine (ALLEGRA) 180 MG tablet Take 180 mg by mouth daily.    [provider]  fluticasone (FLONASE) 50 MCG/ACT nasal spray Place 1 spray into both nostrils daily.    [provider]  glipiZIDE (GLUCOTROL) 5 MG tablet Take 5 mg by mouth daily.     [provider]  ipratropium (ATROVENT) 0.02 % nebulizer solution Take 0.5 mg by nebulization daily.    [provider]  metFORMIN (GLUCOPHAGE) 1000 MG tablet Take 1,000 mg by mouth 2 (two) times daily with a meal.    [provider]  metoprolol tartrate (LOPRESSOR) 25 MG tablet Take 1 tablet (25 mg total) by mouth 2 (two) times daily. 03/03/17   Ramonita Lab, MD  metoprolol tartrate (LOPRESSOR) 25 mg/10 mL SUSP Take 50 mg by mouth 2 (two) times daily.    [provider]  mirtazapine (REMERON) 7.5 MG tablet Take 7.5 mg by mouth at bedtime.    [provider]  montelukast (SINGULAIR) 10 MG tablet Take 10  mg by mouth every morning.     [provider]  mupirocin ointment (BACTROBAN) 2 % Apply to affected area 3 times daily 02/12/17 02/12/18  Emily Filbert, MD  omeprazole (PRILOSEC) 20 MG capsule Take 20 mg by mouth 2 (two) times daily before a meal. Take 30 minutes before breakfast.    [provider]  polyethylene glycol (MIRALAX / GLYCOLAX) packet Take 17 g by mouth daily. 03/04/17   Gouru, Deanna Artis, MD  potassium chloride 20 MEQ/15ML (10%) SOLN Take 20 mEq by mouth daily.    [provider]  traMADol (ULTRAM) 50 MG tablet Take 1 tablet (50 mg total) by mouth every 6 (six)  hours as needed for moderate pain. 03/03/17   Ramonita Lab, MD    Allergies Patient has no known allergies.  Family History  Problem Relation Age of Onset  . Hypertension Father     Social History Social History  Substance Use Topics  . Smoking status: Former Games developer  . Smokeless tobacco: Former Neurosurgeon  . Alcohol use No    Review of Systems  Constitutional: Negative for fever. ENT: + epistaxis. No neck injury Cardiovascular: Negative for chest injury. Respiratory: Negative for shortness of breath. Negative for chest wall injury. Gastrointestinal: Negative for abdominal pain or injury. Genitourinary: Negative for dysuria. Musculoskeletal: Negative for back injury, negative for arm or leg pain. + L hand pain  Skin: Negative for laceration/abrasions. Neurological: Negative for head injury.  ____________________________________________   PHYSICAL EXAM:  VITAL SIGNS: ED Triage Vitals  Enc Vitals Group     BP 03/10/17 1424 (!) 107/56     Pulse Rate 03/10/17 1424 86     Resp 03/10/17 1424 18     Temp 03/10/17 1424 97.6 F (36.4 C)     Temp Source 03/10/17 1424 Oral     SpO2 03/10/17 1424 98 %     Weight 03/10/17 1425 135 lb (61.2 kg)     Height 03/10/17 1425 5\' 3"  (1.6 m)     Head Circumference --      Peak Flow --      Pain Score --      Pain Loc --      Pain Edu? --      Excl. in GC? --    Constitutional: Alert and oriented x 2. No acute distress. Does not appear intoxicated. HEENT Head: Normocephalic and atraumatic. Face: No facial bony tenderness. Stable midface Ears: No hemotympanum bilaterally. No Battle sign Eyes: No eye injury. PERRL. No raccoon eyes Nose: Nontender. Clots in b/l nares with no active bleeding. No rhinorrhea Mouth/Throat: Mucous membranes are moist. No oropharyngeal blood. No dental injury. Airway patent without stridor. Normal voice. Neck: no C-collar in place. No midline c-spine tenderness.  Cardiovascular: Normal rate, regular rhythm.  Normal and symmetric distal pulses are present in all extremities. Pulmonary/Chest: Chest wall is stable and nontender to palpation/compression. Normal respiratory effort. Breath sounds are normal. No crepitus.  Abdominal: Soft, nontender, non distended. Musculoskeletal: Nontender with normal full range of motion in all extremities. No deformities. No thoracic or lumbar midline spinal tenderness. Pelvis is stable. ttp over the [facility] joint on the L Skin: Skin is warm, dry and intact.  Psychiatric: Speech and behavior are appropriate. Neurological: Normal speech and language. Moves all extremities to command. No gross focal neurologic deficits are appreciated.  Glascow Coma Score: 4 - Opens eyes on own 6 - Follows simple motor commands 5 - Alert and oriented GCS: 14   ____________________________________________  LABS (all labs ordered are listed, but only abnormal results are displayed)  Labs Reviewed - No data to display ____________________________________________  EKG  none ____________________________________________  RADIOLOGY  Head CT:. No acute intracranial abnormality. 2. Stable severe generalized atrophy and severe chronic microvascular ischemic changes of the white matter. 3. Stable incidental 5 mm calcified meningioma involving the left frontal region.  XR L hand:  No acute osseous abnormality. Scattered degenerative changes, moderate severe at the first [facility] joint. ____________________________________________   PROCEDURES  Procedure(s) performed: None Procedures Critical Care performed:  None ____________________________________________   INITIAL IMPRESSION / ASSESSMENT AND PLAN / ED COURSE  83 y.o. female with a history of Alzheimer's, atrial fibrillation on Eliquis who presents for evaluation of a fallfrom a wheelchair. Patient with epistaxis however no active bleeding at this time. Patient is complaining of pain in her left hand. Patient does remember  falling from the wheelchair. There has been no LOC. However due to history of dementia and the fact the patient is on blood thinners a CT head was done which is negative for any acute bleed. X-ray of the hand with no acute findings. Patient will be discharged back to SNF.      As part of my medical decision making, I reviewed the following data within the electronic MEDICAL RECORD NUMBER Nursing notes reviewed and incorporated, Radiograph reviewed , Notes from prior ED visits and [place] Controlled Substance Database    Pertinent labs & imaging results that were available during my care of the patient were reviewed by me and considered in my medical decision making (see chart for details).    ____________________________________________   FINAL CLINICAL IMPRESSION(S) / ED DIAGNOSES  Final diagnoses:  Fall, initial encounter  Injury of head, initial encounter  Epistaxis  Left hand pain      NEW MEDICATIONS STARTED DURING THIS VISIT:  New Prescriptions   No medications on file     Note:  This document was prepared using Dragon voice recognition software and may include unintentional dictation errors.    Nita Sickle, MD 03/10/17 [phone]

## 2017-03-10 NOTE — Discharge Instructions (Signed)

## 2017-03-10 NOTE — ED Notes (Signed)
ED Provider at bedside. 

## 2017-03-10 NOTE — ED Notes (Signed)
Pt discharged via ems to [facility]. Pt is sleeping soundly, waking to be moved and have vital signs taken before transport. Pt denies pain. NAD noted.

## 2017-03-10 NOTE — ED Notes (Signed)
Patient transported to CT 

## 2017-04-21 ENCOUNTER — Ambulatory Visit: Payer: Medicare Other | Admitting: Internal Medicine

## 2017-04-27 ENCOUNTER — Ambulatory Visit: Payer: Medicare Other | Admitting: Internal Medicine

## 2017-04-30 ENCOUNTER — Encounter: Payer: Medicare Other | Attending: Physician Assistant | Admitting: Physician Assistant

## 2017-04-30 DIAGNOSIS — I429 Cardiomyopathy, unspecified: Secondary | ICD-10-CM | POA: Insufficient documentation

## 2017-04-30 DIAGNOSIS — L97323 Non-pressure chronic ulcer of left ankle with necrosis of muscle: Secondary | ICD-10-CM | POA: Diagnosis not present

## 2017-04-30 DIAGNOSIS — M8580 Other specified disorders of bone density and structure, unspecified site: Secondary | ICD-10-CM | POA: Diagnosis not present

## 2017-04-30 DIAGNOSIS — L97523 Non-pressure chronic ulcer of other part of left foot with necrosis of muscle: Secondary | ICD-10-CM | POA: Diagnosis not present

## 2017-04-30 DIAGNOSIS — I4891 Unspecified atrial fibrillation: Secondary | ICD-10-CM | POA: Diagnosis not present

## 2017-04-30 DIAGNOSIS — Z950 Presence of cardiac pacemaker: Secondary | ICD-10-CM | POA: Insufficient documentation

## 2017-04-30 DIAGNOSIS — F0280 Dementia in other diseases classified elsewhere without behavioral disturbance: Secondary | ICD-10-CM | POA: Diagnosis not present

## 2017-04-30 DIAGNOSIS — Z7984 Long term (current) use of oral hypoglycemic drugs: Secondary | ICD-10-CM | POA: Diagnosis not present

## 2017-04-30 DIAGNOSIS — E11621 Type 2 diabetes mellitus with foot ulcer: Secondary | ICD-10-CM | POA: Insufficient documentation

## 2017-04-30 DIAGNOSIS — I509 Heart failure, unspecified: Secondary | ICD-10-CM | POA: Diagnosis not present

## 2017-04-30 DIAGNOSIS — M069 Rheumatoid arthritis, unspecified: Secondary | ICD-10-CM | POA: Diagnosis not present

## 2017-04-30 DIAGNOSIS — I110 Hypertensive heart disease with heart failure: Secondary | ICD-10-CM | POA: Insufficient documentation

## 2017-04-30 DIAGNOSIS — G301 Alzheimer's disease with late onset: Secondary | ICD-10-CM | POA: Diagnosis not present

## 2017-05-02 NOTE — Progress Notes (Signed)
[name] [name] [name] [name] ([id number]) Visit Report for 04/30/2017 Allergy List Details Patient Name: [name] [name]. Date of Service: 04/30/2017 12:30 PM Medical Record Number: [id number] Patient Account Number: [id number] Date of Birth/Sex: 07-Jul-1929 (83 y.o. Female) Treating RN: [name] [name] Primary Care [name] [name] Other Clinician: Referring [name] [name]: Treating [name] [name]/Extender: [name] [name] Weeks in Treatment: 0 Allergies Active Allergies No Known Allergies Allergy Notes Electronic Signature(s) Signed: 04/30/2017 4:21:05 PM By: [name] [name] Entered By: [name] [name] on 04/30/2017 13:38:31 [name] [name] Creed ([id number]) -------------------------------------------------------------------------------- Arrival Information Details Patient Name: [name] [name]. Date of Service: 04/30/2017 12:30 PM Medical Record Number: [id number] Patient Account Number: [id number] Date of Birth/Sex: 1930-02-04 (83 y.o. Female) Treating RN: [name] [name] Primary Care [name] [name]: [name] [name] Other Clinician: Referring [name] [name]: Treating [name] [name]/Extender: [name] [name] Weeks in Treatment: 0 Visit Information Patient Arrived: Wheel Chair Arrival Time: 13:03 Accompanied By: caregiver Transfer Assistance: None Patient Identification Verified: Yes Secondary Verification Process Completed: Yes History Since Last Visit Electronic Signature(s) Signed: 04/30/2017 4:21:05 PM By: [name] [name] Entered By: [name] [name] on 04/30/2017 13:37:05 [name] [name] Creed ([id number]) -------------------------------------------------------------------------------- Clinic Level of Care Assessment Details Patient Name: [name] [name]. Date of Service: 04/30/2017 12:30 PM Medical Record Number: [id number] Patient Account Number: [id number] Date of Birth/Sex: 28-Sep-1929 (83 y.o. Female) Treating RN: [name] [name] Primary Care Hazyl Marseille: [name] [name] Other Clinician: Referring  [name] [name]: Treating [name] [name]/Extender: [name] [name] Weeks in Treatment: 0 Clinic Level of Care Assessment Items TOOL 2 Quantity Score X - Use when only an EandM is performed on the INITIAL visit 1 0 ASSESSMENTS - Nursing Assessment / Reassessment X - General Physical Exam (combine w/ comprehensive assessment (listed just below) when 1 20 performed on new pt. evals) X- 1 25 Comprehensive Assessment (HX, ROS, Risk Assessments, Wounds Hx, etc.) ASSESSMENTS - Wound and Skin Assessment / Reassessment []  - Simple Wound Assessment / Reassessment - one wound 0 X- 4 5 Complex Wound Assessment / Reassessment - multiple wounds []  - 0 Dermatologic / Skin Assessment (not related to wound area) ASSESSMENTS - Ostomy and/or Continence Assessment and Care []  - Incontinence Assessment and Management 0 []  - 0 Ostomy Care Assessment and Management (repouching, etc.) PROCESS - Coordination of Care []  - Simple Patient / Family Education for ongoing care 0 X- 1 20 Complex (extensive) Patient / Family Education for ongoing care X- 1 10 Staff obtains , Records, Test Results / Process Orders []  - 0 Staff telephones HHA, Nursing Homes / Clarify orders / etc []  - 0 Routine Transfer to another Facility (non-emergent condition) []  - 0 Routine Hospital Admission (non-emergent condition) []  - 0 New Admissions / / Ordering NPWT, Apligraf, etc. []  - 0 Emergency Hospital Admission (emergent condition) []  - 0 Simple Discharge Coordination X- 1 15 Complex (extensive) Discharge Coordination PROCESS - Special Needs []  - Pediatric / Minor Patient Management 0 []  - 0 Isolation Patient Management [name], ( ) []  - 0 Hearing / Language / Visual special needs []  - 0 Assessment of Community assistance (transportation, D/C planning, etc.) []  - 0 Additional assistance / Altered mentation []  - 0 Support Surface(s) Assessment (bed, cushion, seat,  etc.) INTERVENTIONS - Wound Cleansing / Measurement X - Wound Imaging (photographs - any number of wounds) 1 5 []  - 0 Wound Tracing (instead of photographs) []  - 0 Simple Wound Measurement - one wound X- 4 5 Complex Wound Measurement - multiple wounds []  - 0 Simple Wound Cleansing - one wound X- 4 5 Complex Wound Cleansing -  multiple wounds INTERVENTIONS - Wound Dressings []  - Small Wound Dressing one or multiple wounds 0 X- 4 15 Medium Wound Dressing one or multiple wounds []  - 0 Large Wound Dressing one or multiple wounds []  - 0 Application of Medications - injection INTERVENTIONS - Miscellaneous []  - External ear exam 0 []  - 0 Specimen Collection (cultures, biopsies, blood, body fluids, etc.) []  - 0 Specimen(s) / Culture(s) sent or taken to Lab for analysis []  - 0 Patient Transfer (multiple staff / Lift / Similar devices) []  - 0 Simple Staple / Suture removal (25 or less) []  - 0 Complex Staple / Suture removal (26 or more) []  - 0 Hypo / Hyperglycemic Management (close monitor of Blood Glucose) []  - 0 Ankle / Brachial Index (ABI) - do not check if billed separately Has the patient been seen at the hospital within the last three years: Yes Total Score: 215 Level Of Care: New/Established - Level 5 Electronic Signature(s) Signed: 04/30/2017 4:21:05 PM By: Entered By: on 04/30/2017 15:34:13 Deterding, ( ) -------------------------------------------------------------------------------- Encounter Discharge Information Details Patient Name: [name] [name]. Date of Service: 04/30/2017 12:30 PM Medical Record Number: Patient Account Number: Date of Birth/Sex: [birth year] (83 y.o. Female) Treating RN: [name] [name] Primary Care Yaresly Menzel: [name] [name] Other Clinician: Referring Ignacio Lowder: Treating [name] [name]/Extender: 05/02/2017, [name] Weeks in Treatment: 0 Encounter Discharge Information  Items Discharge Pain Level: 0 Discharge Condition: Stable Ambulatory Status: Wheelchair Discharge Destination: Home Transportation: Other Accompanied By: caregiver Schedule Follow-up Appointment: Yes Medication Reconciliation completed and No provided to Patient/Care Daphne Karrer: Provided on Clinical Summary of Care: 04/30/2017 Form Type Recipient Paper Patient LG Electronic Signature(s) Signed: 04/30/2017 3:38:56 PM By: [name] [name] Entered By: 05/02/2017 on 04/30/2017 15:38:56 Wiechmann, [id number] (12/10/1929) -------------------------------------------------------------------------------- Lower Extremity Assessment Details Patient Name: 85. Date of Service: 04/30/2017 12:30 PM Medical Record Number: [name] [name] Patient Account Number: [name] Dibbles Date of Birth/Sex: Nov 23, 1929 (83 y.o. Female) Treating RN: [name] [name] Primary Care Danijah Noh: [name] [name] Other Clinician: Referring Deran Barro: Treating Shatha Hooser/Extender: 05/02/2017, [name] Weeks in Treatment: 0 Edema Assessment Assessed: [Left: No] [Right: No] Edema: [Left: N] [Right: o] Vascular Assessment Claudication: Claudication Assessment [Left:Rest Pain] Pulses: Dorsalis Pedis Palpable: [Left:No] Doppler Audible: [Left:Yes] Posterior Tibial Extremity colors, hair growth, and conditions: Extremity Color: [Left:Hyperpigmented] Hair Growth on Extremity: [Left:No] Temperature of Extremity: [Left:Warm] Capillary Refill: [Left:> 3 seconds] Toe Nail Assessment Left: Right: Thick: Yes Discolored: Yes Deformed: Yes Improper Length and Hygiene: Yes Electronic Signature(s) Signed: 04/30/2017 4:21:05 PM By: [id number] Entered By: [name] [name] on 04/30/2017 13:38:22 Artus, [id number] ([id number]) -------------------------------------------------------------------------------- Multi Wound Chart Details Patient Name: 12/10/1929. Date of Service: 04/30/2017 12:30 PM Medical Record Number:  [name] [name] Patient Account Number: [name] [name] Date of Birth/Sex: 1929/11/06 (83 y.o. Female) Treating RN: [name] [name] Primary Care Lucky Trotta: [name] [name] Other Clinician: Referring Chelcy Bolda: Treating Rodneshia Greenhouse/Extender: 05/02/2017 in Treatment: 0 Vital Signs Height(in): 62 Pulse(bpm): 74 Weight(lbs): 140 Blood Pressure(mmHg): 111/53 Body Mass Index(BMI): 26 Temperature(F): Respiratory Rate 18 (breaths/min): Photos: [2:No Photos] [3:No Photos] [4:No Photos] Wound Location: [2:Left Lower Leg] [3:Left Foot - Lateral] [4:Left Foot - Medial] Wounding Event: [2:Gradually Appeared] [3:Gradually Appeared] [4:Gradually Appeared] Primary Etiology: [2:Arterial Insufficiency Ulcer] [3:Arterial Insufficiency Ulcer] [4:Arterial Insufficiency Ulcer] Comorbid History: [2:Chronic Obstructive Pulmonary Disease (COPD), Pulmonary Disease (COPD), Pulmonary Disease (COPD), Arrhythmia, Congestive Heart Arrhythmia, Congestive Heart Arrhythmia, Congestive Heart Failure, Coronary Artery Disease, Hypertension,  Type II Disease, Hypertension, Type II Disease, Hypertension, Type II Diabetes, Rheumatoid Arthritis,  Dementia] [3:Chronic Obstructive Failure, Coronary Artery Diabetes, Rheumatoid Arthritis, Dementia] [4:Chronic Obstructive Failure, Coronary Artery  Diabetes, Rheumatoid Arthritis, Dementia] Date Acquired: [2:04/02/2017] [3:02/08/2017] [4:04/12/2017] Weeks of Treatment: [2:0] [3:0] [4:0] Wound Status: [2:Open] [3:Open] [4:Open] Pending Amputation on [2:Yes] [3:Yes] [4:Yes] Presentation: Measurements L x W x D [2:0.5x0.5x0.3] [3:2x8.5x0.1] [4:8x5x0.1] (cm) Area (cm) : [2:0.196] [3:13.352] [4:31.416] Volume (cm) : [2:0.059] [3:1.335] [4:3.142] % Reduction in Area: [2:0.00%] [3:0.00%] [4:0.00%] % Reduction in Volume: [2:0.00%] [3:0.00%] [4:0.00%] Classification: [2:Full Thickness Without Exposed Support Structures] [3:Unclassifiable] [4:Unclassifiable] Exudate Amount: [2:Medium]  [3:None Present] [4:None Present] Exudate Type: [2:Serous] [3:N/A] [4:N/A] Exudate Color: [2:amber] [3:N/A] [4:N/A] Wound Margin: [2:Flat and Intact] [3:Indistinct, nonvisible] [4:Indistinct, nonvisible] Granulation Amount: [2:Small (1-33%)] [3:None Present (0%)] [4:None Present (0%)] Necrotic Amount: [2:Large (67-100%)] [3:Large (67-100%)] [4:Large (67-100%)] Exposed Structures: [2:Fat Layer (Subcutaneous Tissue) Exposed: Yes Fascia: No Tendon: No Muscle: No Joint: No Bone: No] [3:Fascia: No Fat Layer (Subcutaneous Tissue) Exposed: No Tendon: No Muscle: No Joint: No] [4:Fascia: No Fat Layer (Subcutaneous Tissue) Exposed: No  Tendon: No Muscle: No Joint: No Bone: No] Bone: No Limited to Skin Breakdown Epithelialization: None None None Periwound Skin Texture: Excoriation: No Excoriation: No No Abnormalities Noted Induration: No Induration: No Callus: No Callus: No Crepitus: No Crepitus: No Rash: No Rash: No Scarring: No Scarring: No Periwound Skin Moisture: Maceration: No Maceration: No No Abnormalities Noted Dry/Scaly: No Dry/Scaly: No Periwound Skin Color: Ecchymosis: Yes Ecchymosis: Yes Ecchymosis: Yes Atrophie Blanche: No Atrophie Blanche: No Hemosiderin Staining: Yes Cyanosis: No Cyanosis: No Erythema: No Erythema: No Hemosiderin Staining: No Hemosiderin Staining: No Mottled: No Mottled: No Pallor: No Pallor: No Rubor: No Rubor: No Temperature: N/A No Abnormality N/A Tenderness on Palpation: No Yes Yes Wound Preparation: Ulcer Cleansing: Ulcer Cleansing: Ulcer Cleansing: Rinsed/Irrigated with Saline Rinsed/Irrigated with Saline Rinsed/Irrigated with Saline Topical Anesthetic Applied: None Wound Number: 5 N/A N/A Photos: No Photos N/A N/A Wound Location: Left Achilles N/A N/A Wounding Event: Gradually Appeared N/A N/A Primary Etiology: Arterial Insufficiency Ulcer N/A N/A Comorbid History: Chronic Obstructive N/A N/A Pulmonary Disease  (COPD), Arrhythmia, Congestive Heart Failure, Coronary Artery Disease, Hypertension, Type II Diabetes, Rheumatoid Arthritis, Dementia Date Acquired: 04/12/2017 N/A N/A Weeks of Treatment: 0 N/A N/A Wound Status: Open N/A N/A Pending Amputation on Yes N/A N/A Presentation: Measurements L x W x D 0.5x0.7x0.5 N/A N/A (cm) Area (cm) : 0.275 N/A N/A Volume (cm) : 0.137 N/A N/A % Reduction in Area: 0.00% N/A N/A % Reduction in Volume: 0.00% N/A N/A Classification: Full Thickness With Exposed N/A N/A Support Structures Exudate Amount: Small N/A N/A Exudate Type: Serous N/A N/A Exudate Color: amber N/A N/A Wound Margin: Flat and Intact N/A N/A Granulation Amount: None Present (0%) N/A N/A Necrotic Amount: None Present (0%) N/A N/A Stell, Synai M. ([id number]) Exposed Structures: Fat Layer (Subcutaneous N/A N/A Tissue) Exposed: Yes Tendon: Yes Fascia: No Muscle: No Joint: No Bone: No Epithelialization: None N/A N/A Periwound Skin Texture: Excoriation: No N/A N/A Induration: No Callus: No Crepitus: No Rash: No Scarring: No Periwound Skin Moisture: Maceration: Yes N/A N/A Dry/Scaly: No Periwound Skin Color: Atrophie Blanche: No N/A N/A Cyanosis: No Ecchymosis: No Erythema: No Hemosiderin Staining: No Mottled: No Pallor: No Rubor: No Temperature: N/A N/A N/A Tenderness on Palpation: No N/A N/A Wound Preparation: Ulcer Cleansing: N/A N/A Rinsed/Irrigated with Saline Treatment Notes Electronic Signature(s) Signed: 04/30/2017 4:21:05 PM By: [name] [name] Entered By: [name] [name] on 04/30/2017 13:41:52 [name] [name] Creed ([id number]) -------------------------------------------------------------------------------- Multi-Disciplinary Care Plan Details Patient Name: [name] [name]. Date of Service:  04/30/2017 12:30 PM Medical Record Number: [id number] Patient Account Number: [id number] Date of Birth/Sex: August 06, 1929 (83 y.o. Female) Treating RN: [name] CriglerFlinchum,  Cheryl Primary Care Arhum Peeples: [name] EinsteinASANAYAKA, GAYANI Other Clinician: Referring Koehn Salehi: Treating Karlen Barbar/Extender: [name] DibblesSTONE III, [name] Weeks in Treatment: 0 Active Inactive ` Orientation to the Wound Care Program Nursing Diagnoses: Knowledge deficit related to the wound healing center program Goals: Patient/caregiver will verbalize understanding of the Wound Healing Center Program Date Initiated: 04/30/2017 Target Resolution Date: 05/31/2017 Goal Status: Active Interventions: Provide education on orientation to the wound center Notes: ` Wound/Skin Impairment Nursing Diagnoses: Impaired tissue integrity Knowledge deficit related to ulceration/compromised skin integrity Goals: Patient/caregiver will verbalize understanding of skin care regimen Date Initiated: 04/30/2017 Target Resolution Date: 05/31/2017 Goal Status: Active Ulcer/skin breakdown will have a volume reduction of 30% by week 4 Date Initiated: 04/30/2017 Target Resolution Date: 05/31/2017 Goal Status: Active Interventions: Assess patient/caregiver ability to obtain necessary supplies Assess patient/caregiver ability to perform ulcer/skin care regimen upon admission and as needed Assess ulceration(s) every visit Provide education on ulcer and skin care Treatment Activities: Skin care regimen initiated : 04/30/2017 Notes: Electronic Signature(s) [name] BatonGREEN, Vinette M. ([id number]) Signed: 04/30/2017 4:21:05 PM By: [name] CriglerFlinchum, Cheryl Entered By: [name] CriglerFlinchum, Cheryl on 04/30/2017 13:41:37 [name] [name] CreedLULA M. ([id number]) -------------------------------------------------------------------------------- Pain Assessment Details Patient Name: [name] BatonGREEN, Khloee M. Date of Service: 04/30/2017 12:30 PM Medical Record Number: [id number] Patient Account Number: [id number] Date of Birth/Sex: August 06, 1929 (83 y.o. Female) Treating RN: [name] CriglerFlinchum, Cheryl Primary Care Philemon Riedesel: [name] EinsteinASANAYAKA, GAYANI Other Clinician: Referring Sheng Pritz: Treating Aviyon Hocevar/Extender:  [name] DibblesSTONE III, [name] Weeks in Treatment: 0 Active Problems Location of Pain Severity and Description of Pain Patient Has Paino Yes Site Locations Rate the pain. Current Pain Level: 6 Pain Management and Medication Current Pain Management: Electronic Signature(s) Signed: 04/30/2017 4:21:05 PM By: [name] CriglerFlinchum, Cheryl Entered By: [name] CriglerFlinchum, Cheryl on 04/30/2017 13:37:46 [name] [name] CreedLULA M. ([id number]) -------------------------------------------------------------------------------- Patient/Caregiver Education Details Patient Name: [name] BatonGREEN, Keaira M. Date of Service: 04/30/2017 12:30 PM Medical Record Number: [id number] Patient Account Number: [id number] Date of Birth/Gender: August 06, 1929 (83 y.o. Female) Treating RN: [name] CriglerFlinchum, Cheryl Primary Care Physician: [name] EinsteinASANAYAKA, GAYANI Other Clinician: Referring Physician: Treating Physician/Extender: Skeet SimmerSTONE III, [name] Weeks in Treatment: 0 Education Assessment Education Provided To: Caregiver Education Topics Provided Welcome To The Wound Care Center: Handouts: Welcome To The Wound Care Center Methods: Explain/Verbal Responses: State content correctly Wound Debridement: Handouts: Wound Debridement Methods: Explain/Verbal Responses: State content correctly Wound/Skin Impairment: Handouts: Caring for Your Ulcer Methods: Explain/Verbal Responses: State content correctly Electronic Signature(s) Signed: 04/30/2017 4:21:05 PM By: [name] CriglerFlinchum, Cheryl Entered By: [name] CriglerFlinchum, Cheryl on 04/30/2017 15:39:21 [name] [name] CreedLULA M. ([id number]) -------------------------------------------------------------------------------- Wound Assessment Details Patient Name: [name] BatonGREEN, Marlette M. Date of Service: 04/30/2017 12:30 PM Medical Record Number: [id number] Patient Account Number: [id number] Date of Birth/Sex: August 06, 1929 (83 y.o. Female) Treating RN: Huel CoventryWoody, Kim Primary Care Tyasia Packard: [name] EinsteinASANAYAKA, GAYANI Other Clinician: Referring Karon Heckendorn: Treating Janiah Devinney/Extender: Altamese CarolinaOBSON, MICHAEL  G Weeks in Treatment: 0 Wound Status Wound Number: 2 Primary Arterial Insufficiency Ulcer Etiology: Wound Location: Left Lower Leg Wound Open Wounding Event: Gradually Appeared Status: Date Acquired: 04/02/2017 Comorbid Chronic Obstructive Pulmonary Disease (COPD), Weeks Of Treatment: 0 History: Arrhythmia, Congestive Heart Failure, Coronary Clustered Wound: No Artery Disease, Hypertension, Type II Diabetes, Pending Amputation On Presentation Rheumatoid Arthritis, Dementia Photos Photo Uploaded By: [name] CriglerFlinchum, Cheryl on 04/30/2017 16:22:49 Wound Measurements Length: (cm) 0.5 Width: (cm) 0.5 Depth: (cm) 0.3 Area: (cm) 0.196 Volume: (cm) 0.059 % Reduction in Area: 0% % Reduction in Volume: 0% Epithelialization: None Tunneling: No Undermining: No Wound Description Full Thickness Without Exposed Support Classification:  Structures Wound Margin: Flat and Intact Exudate Medium Amount: Exudate Type: Serous Exudate Color: amber Foul Odor After Cleansing: No Slough/Fibrino Yes Wound Bed Granulation Amount: Small (1-33%) Exposed Structure Necrotic Amount: Large (67-100%) Fascia Exposed: No Necrotic Quality: Adherent Slough Fat Layer (Subcutaneous Tissue) Exposed: Yes Tendon Exposed: No Muscle Exposed: No Joint Exposed: No [name], Shenekia M. ([id number]) Bone Exposed: No Periwound Skin Texture Texture Color No Abnormalities Noted: No No Abnormalities Noted: No Callus: No Atrophie Blanche: No Crepitus: No Cyanosis: No Excoriation: No Ecchymosis: Yes Induration: No Erythema: No Rash: No Hemosiderin Staining: No Scarring: No Mottled: No Pallor: No Moisture Rubor: No No Abnormalities Noted: No Dry / Scaly: No Maceration: No Wound Preparation Ulcer Cleansing: Rinsed/Irrigated with Saline Topical Anesthetic Applied: None Treatment Notes Wound #2 (Left Lower Leg) 1. Cleansed with: Clean wound with Normal Saline 2. Anesthetic Topical Lidocaine 4% cream to  wound bed prior to debridement 4. Dressing Applied: Santyl Ointment 5. Secondary Dressing Applied ABD Pad Non-Adherent pad Notes kerlix wrap and tape Electronic Signature(s) Signed: 04/30/2017 1:35:34 PM By: Elliot Gurney, BSN, RN, CWS, Kim RN, BSN Entered By: Elliot Gurney, BSN, RN, CWS, Kim on 04/30/2017 13:35:34 Koslow, [name] Creed ([id number]) -------------------------------------------------------------------------------- Wound Assessment Details Patient Name: [name] [name]. Date of Service: 04/30/2017 12:30 PM Medical Record Number: [id number] Patient Account Number: [id number] Date of Birth/Sex: Jan 30, 1930 (83 y.o. Female) Treating RN: Huel Coventry Primary Care Dilara Navarrete: [name] [name] Other Clinician: Referring Kaheem Halleck: Treating Siya Flurry/Extender: Altamese Liscomb in Treatment: 0 Wound Status Wound Number: 3 Primary Arterial Insufficiency Ulcer Etiology: Wound Location: Left Foot - Lateral Wound Open Wounding Event: Gradually Appeared Status: Date Acquired: 02/08/2017 Comorbid Chronic Obstructive Pulmonary Disease (COPD), Weeks Of Treatment: 0 History: Arrhythmia, Congestive Heart Failure, Coronary Clustered Wound: No Artery Disease, Hypertension, Type II Diabetes, Pending Amputation On Presentation Rheumatoid Arthritis, Dementia Photos Photo Uploaded By: [name] [name] on 04/30/2017 16:23:08 Wound Measurements Length: (cm) 2 Width: (cm) 8.5 Depth: (cm) 0.1 Area: (cm) 13.352 Volume: (cm) 1.335 % Reduction in Area: 0% % Reduction in Volume: 0% Epithelialization: None Tunneling: No Undermining: No Wound Description Classification: Unclassifiable Wound Margin: Indistinct, nonvisible Exudate Amount: None Present Foul Odor After Cleansing: No Slough/Fibrino No Wound Bed Granulation Amount: None Present (0%) Exposed Structure Necrotic Amount: Large (67-100%) Fascia Exposed: No Fat Layer (Subcutaneous Tissue) Exposed: No Tendon Exposed: No Muscle Exposed:  No Joint Exposed: No Bone Exposed: No Limited to Skin Breakdown Periwound Skin Texture Mcneish, Ekta M. ([id number]) Texture Color No Abnormalities Noted: No No Abnormalities Noted: No Callus: No Atrophie Blanche: No Crepitus: No Cyanosis: No Excoriation: No Ecchymosis: Yes Induration: No Erythema: No Rash: No Hemosiderin Staining: No Scarring: No Mottled: No Pallor: No Moisture Rubor: No No Abnormalities Noted: No Dry / Scaly: No Temperature / Pain Maceration: No Temperature: No Abnormality Tenderness on Palpation: Yes Wound Preparation Ulcer Cleansing: Rinsed/Irrigated with Saline Treatment Notes Wound #3 (Left, Lateral Foot) 1. Cleansed with: Clean wound with Normal Saline 2. Anesthetic Topical Lidocaine 4% cream to wound bed prior to debridement 4. Dressing Applied: Santyl Ointment 5. Secondary Dressing Applied ABD Pad Non-Adherent pad Notes kerlix wrap and tape Electronic Signature(s) Signed: 04/30/2017 1:37:55 PM By: Elliot Gurney, BSN, RN, CWS, Kim RN, BSN Previous Signature: 04/30/2017 1:36:48 PM Version By: Elliot Gurney, BSN, RN, CWS, Kim RN, BSN Entered By: Elliot Gurney, BSN, RN, CWS, Kim on 04/30/2017 13:37:55 [name] [name] Creed ([id number]) -------------------------------------------------------------------------------- Wound Assessment Details Patient Name: [name] [name]. Date of Service: 04/30/2017 12:30 PM Medical Record Number: [id number] Patient Account Number: [id number] Date of Birth/Sex:  [birth year] (83 y.o. Female) Treating RN: Huel Coventry Primary Care Taffy Delconte: [name] [name] Other Clinician: Referring Rori Goar: Treating Jowan Skillin/Extender: Altamese [place] in Treatment: 0 Wound Status Wound Number: 4 Primary Arterial Insufficiency Ulcer Etiology: Wound Location: Left Foot - Medial Wound Open Wounding Event: Gradually Appeared Status: Date Acquired: 04/12/2017 Comorbid Chronic Obstructive Pulmonary Disease (COPD), Weeks Of Treatment: 0 History:  Arrhythmia, Congestive Heart Failure, Coronary Clustered Wound: No Artery Disease, Hypertension, Type II Diabetes, Pending Amputation On Presentation Rheumatoid Arthritis, Dementia Photos Photo Uploaded By: [name] [name] on 04/30/2017 16:23:32 Wound Measurements Length: (cm) 8 Width: (cm) 5 Depth: (cm) 0.1 Area: (cm) 31.416 Volume: (cm) 3.142 % Reduction in Area: 0% % Reduction in Volume: 0% Epithelialization: None Tunneling: No Undermining: No Wound Description Classification: Unclassifiable Wound Margin: Indistinct, nonvisible Exudate Amount: None Present Foul Odor After Cleansing: No Slough/Fibrino No Wound Bed Granulation Amount: None Present (0%) Exposed Structure Necrotic Amount: Large (67-100%) Fascia Exposed: No Fat Layer (Subcutaneous Tissue) Exposed: No Tendon Exposed: No Muscle Exposed: No Joint Exposed: No Bone Exposed: No Periwound Skin Texture Texture Color Swavely, Georgean M. ([id number]) No Abnormalities Noted: No No Abnormalities Noted: No Ecchymosis: Yes Moisture Hemosiderin Staining: Yes No Abnormalities Noted: No Temperature / Pain Tenderness on Palpation: Yes Wound Preparation Ulcer Cleansing: Rinsed/Irrigated with Saline Treatment Notes Wound #4 (Left, Medial Foot) 1. Cleansed with: Clean wound with Normal Saline 2. Anesthetic Topical Lidocaine 4% cream to wound bed prior to debridement 4. Dressing Applied: Santyl Ointment 5. Secondary Dressing Applied ABD Pad Non-Adherent pad Notes kerlix wrap and tape Electronic Signature(s) Signed: 04/30/2017 1:37:34 PM By: Elliot Gurney, BSN, RN, CWS, Kim RN, BSN Entered By: Elliot Gurney, BSN, RN, CWS, Kim on 04/30/2017 13:37:34 [name] [name] Creed ([id number]) -------------------------------------------------------------------------------- Wound Assessment Details Patient Name: [name] [name]. Date of Service: 04/30/2017 12:30 PM Medical Record Number: [id number] Patient Account Number: [id number] Date of  Birth/Sex: Mar 18, 1930 (83 y.o. Female) Treating RN: Huel Coventry Primary Care Mehmet Scally: [name] [name] Other Clinician: Referring Magdalyn Arenivas: Treating Bonne Whack/Extender: [place] in Treatment: 0 Wound Status Wound Number: 5 Primary Arterial Insufficiency Ulcer Etiology: Wound Location: Left Achilles Wound Open Wounding Event: Gradually Appeared Status: Date Acquired: 04/12/2017 Comorbid Chronic Obstructive Pulmonary Disease (COPD), Weeks Of Treatment: 0 History: Arrhythmia, Congestive Heart Failure, Coronary Clustered Wound: No Artery Disease, Hypertension, Type II Diabetes, Pending Amputation On Presentation Rheumatoid Arthritis, Dementia Photos Photo Uploaded By: [name] [name] on 04/30/2017 16:23:44 Wound Measurements Length: (cm) 0.5 Width: (cm) 0.7 Depth: (cm) 0.5 Area: (cm) 0.275 Volume: (cm) 0.137 % Reduction in Area: 0% % Reduction in Volume: 0% Epithelialization: None Tunneling: No Undermining: No Wound Description Full Thickness With Exposed Support Classification: Structures Wound Margin: Flat and Intact Exudate Small Amount: Exudate Type: Serous Exudate Color: amber Foul Odor After Cleansing: No Slough/Fibrino Yes Wound Bed Granulation Amount: None Present (0%) Exposed Structure Necrotic Amount: None Present (0%) Fascia Exposed: No Fat Layer (Subcutaneous Tissue) Exposed: Yes Tendon Exposed: Yes Muscle Exposed: No Joint Exposed: No Ruder, Kia M. ([id number]) Bone Exposed: No Periwound Skin Texture Texture Color No Abnormalities Noted: No No Abnormalities Noted: No Callus: No Atrophie Blanche: No Crepitus: No Cyanosis: No Excoriation: No Ecchymosis: No Induration: No Erythema: No Rash: No Hemosiderin Staining: No Scarring: No Mottled: No Pallor: No Moisture Rubor: No No Abnormalities Noted: No Dry / Scaly: No Maceration: Yes Wound Preparation Ulcer Cleansing: Rinsed/Irrigated with Saline Treatment  Notes Wound #5 (Left Achilles) 1. Cleansed with: Clean wound with Normal Saline 2. Anesthetic Topical Lidocaine 4% cream to wound bed prior  to debridement 4. Dressing Applied: Other dressing (specify in notes) 5. Secondary Dressing Applied ABD Pad Non-Adherent pad Notes [employer] Electronic Signature(s) Signed: 04/30/2017 1:38:57 PM By: Elliot Gurney, BSN, RN, CWS, Kim RN, BSN Entered By: Elliot Gurney, BSN, RN, CWS, Kim on 04/30/2017 13:38:57 [name] [name] Creed ([id number]) -------------------------------------------------------------------------------- Vitals Details Patient Name: [name] [name]. Date of Service: 04/30/2017 12:30 PM Medical Record Number: [id number] Patient Account Number: [id number] Date of Birth/Sex: December 17, 1929 (83 y.o. Female) Treating RN: [name] [name] Primary Care Anylah Scheib: [name] [name] Other Clinician: Referring Franchelle Foskett: Treating Dorwin Fitzhenry/Extender: [name] [name] Weeks in Treatment: 0 Vital Signs Time Taken: 01:05 Pulse (bpm): 74 Height (in): 62 Respiratory Rate (breaths/min): 18 Source: Stated Blood Pressure (mmHg): 111/53 Weight (lbs): 140 Reference Range: 80 - 120 mg / dl Source: Stated Body Mass Index (BMI): 25.6 Electronic Signature(s) Signed: 04/30/2017 4:21:05 PM By: [name] [name] Entered By: [name] [name] on 04/30/2017 13:37:55

## 2017-05-02 NOTE — Progress Notes (Signed)
[name] [name], Rex M. ([id number]) Visit Report for 04/30/2017 Chief Complaint Document Details Patient Name: [name] [name], [name] M. Date of Service: 04/30/2017 12:30 PM Medical Record Number: [id number] Patient Account Number: [id number] Date of Birth/Sex: 10/15/1929 (83 y.o. Female) Treating RN: Renne CriglerFlinchum, Cheryl Primary Care Provider: Karlene EinsteinASANAYAKA, GAYANI Other Clinician: Referring Provider: Treating Provider/Extender: Linwood DibblesSTONE III, Quantez Schnyder Weeks in Treatment: 0 Information Obtained from: Patient Chief Complaint Left foot ulcers Electronic Signature(s) Signed: 05/02/2017 1:59:17 AM By: Lenda KelpStone III, Panda Crossin PA-C Entered By: Lenda KelpStone III, Revan Gendron on 05/02/2017 01:48:41 Brittingham, Lorinda CreedLULA M. ([id number]) -------------------------------------------------------------------------------- HPI Details Patient Name: [name] [name], [name] M. Date of Service: 04/30/2017 12:30 PM Medical Record Number: [id number] Patient Account Number: [id number] Date of Birth/Sex: 10/15/1929 (83 y.o. Female) Treating RN: Renne CriglerFlinchum, Cheryl Primary Care Provider: Karlene EinsteinASANAYAKA, GAYANI Other Clinician: Referring Provider: Treating Provider/Extender: Linwood DibblesSTONE III, Ein Rijo Weeks in Treatment: 0 History of Present Illness HPI Description: 83 year old patient who has significant dementia had been seen recently in the ED for an infected toe and this was the left second toe on the plantar surface.past medical history significant for atrial fibrillation, Alzheimer's disease, carotid artery disease, cardiomyopathy, CHF, dementia, diabetes mellitus, hypertension, status post hysterectomy and pacemaker placement. x-ray done in the ER showed diffuse osteopenia with no definite evidence of osteomyelitis. the patient had strong dorsalis pedis pulse on Doppler, she was placed on Keflex and Bactroban ointment and was asked to see the wound center Readmission: 04/30/17 on evaluation today patient appears to be doing poorly in regard to her left foot. She has several ulcers noted  on evaluation today which are very degrees of depth although the posterior Achilles ulcer does show evidence of tendon involvement at this point. She does not appear to have any evidence of infection. No fevers, chills, nausea, or vomiting noted at this time. She has been seeing her podiatrist Dr. Orland Jarredroxler who did perform the amputation on her left second toe which was back in October 2018 following her first evaluation and our clinic. The state is patient's legal guardian at this point. Electronic Signature(s) Signed: 05/02/2017 1:59:17 AM By: Lenda KelpStone III, Gabi Mcfate PA-C Entered By: Lenda KelpStone III, Jo-Ann Johanning on 05/02/2017 01:51:41 Rimmer, Lorinda CreedLULA M. ([id number]) -------------------------------------------------------------------------------- Physical Exam Details Patient Name: [name] [name], [name] M. Date of Service: 04/30/2017 12:30 PM Medical Record Number: [id number] Patient Account Number: [id number] Date of Birth/Sex: 10/15/1929 (83 y.o. Female) Treating RN: Renne CriglerFlinchum, Cheryl Primary Care Provider: Karlene EinsteinASANAYAKA, GAYANI Other Clinician: Referring Provider: Treating Provider/Extender: Linwood DibblesSTONE III, Laelani Vasko Weeks in Treatment: 0 Constitutional sitting or standing blood pressure is within target range for patient.. pulse regular and within target range for patient.Marland Kitchen. respirations regular, non-labored and within target range for patient.Marland Kitchen. temperature within target range for patient.. Chronically ill appearing but in no apparent acute distress. Eyes conjunctiva clear no eyelid edema noted. pupils equal round and reactive to light and accommodation. Ears, Nose, Mouth, and Throat no gross abnormality of ear auricles or external auditory canals. mucus membranes moist. Respiratory normal breathing without difficulty. clear to auscultation bilaterally. Cardiovascular regular rate and rhythm with normal S1, S2. Absent posterior tibial and dorsalis pedis pulses bilateral lower extremities. We could only hear her pulses with the  doppler. no clubbing, cyanosis, significant edema, <3 sec cap refill. Gastrointestinal (GI) soft, non-tender, non-distended, +BS. no ventral hernia noted. Musculoskeletal Patient unable to walk without assistance. Patients bilateral LEs are severly contracted. Psychiatric this patient is able to make decisions and demonstrates good insight into disease process. Alert and Oriented x 3. pleasant and cooperative. Notes Patient's wound shows various degrees of tissue breakdown  although the worst seem to be her left Achilles region where the Achilles tendon was actually visible. No sharp debridement was performed on evaluation today. Electronic Signature(s) Signed: 05/02/2017 1:59:17 AM By: Lenda Kelp PA-C Entered By: Lenda Kelp on 05/02/2017 01:54:56 Bender, [name] ([id number]) -------------------------------------------------------------------------------- Physician Orders Details Patient Name: [name] Baton. Date of Service: 04/30/2017 12:30 PM Medical Record Number: [id number] Patient Account Number: [id number] Date of Birth/Sex: [birth year] (83 y.o. Female) Treating RN: Renne Crigler Primary Care Provider: Karlene Einstein Other Clinician: Referring Provider: Treating Provider/Extender: Linwood Dibbles, Maxie Slovacek Weeks in Treatment: 0 Verbal / Phone Orders: No Diagnosis Coding Wound Cleansing Wound #2 Left Lower Leg o Clean wound with Normal Saline. o Cleanse wound with mild soap and water Wound #3 Left,Lateral Foot o Clean wound with Normal Saline. o Cleanse wound with mild soap and water Wound #4 Left,Medial Foot o Clean wound with Normal Saline. o Cleanse wound with mild soap and water Wound #5 Left Achilles o Clean wound with Normal Saline. o Cleanse wound with mild soap and water Anesthetic (add to Medication List) Wound #2 Left Lower Leg o Topical Lidocaine 4% cream applied to wound bed prior to debridement (In Clinic Only). Wound #3 Left,Lateral  Foot o Topical Lidocaine 4% cream applied to wound bed prior to debridement (In Clinic Only). Wound #4 Left,Medial Foot o Topical Lidocaine 4% cream applied to wound bed prior to debridement (In Clinic Only). Wound #5 Left Achilles o Topical Lidocaine 4% cream applied to wound bed prior to debridement (In Clinic Only). Primary Wound Dressing Wound #2 Left Lower Leg o Santyl Ointment Wound #3 Left,Lateral Foot o Santyl Ointment Wound #4 Left,Medial Foot o Santyl Ointment Wound #5 Left Achilles o Other: - silvercell o Other: - silvercell Cromartie, [name] ([id number]) Secondary Dressing Wound #2 Left Lower Leg o ABD pad o Non-adherent pad Wound #3 Left,Lateral Foot o ABD pad o Non-adherent pad Wound #4 Left,Medial Foot o ABD pad o Non-adherent pad Wound #5 Left Achilles o ABD pad o Non-adherent pad Dressing Change Frequency Wound #2 Left Lower Leg o Three times weekly Wound #3 Left,Lateral Foot o Three times weekly Wound #4 Left,Medial Foot o Three times weekly Wound #5 Left Achilles o Three times weekly Follow-up Appointments o Return Appointment in 1 week. Notes light kerlix wrap Electronic Signature(s) Signed: 04/30/2017 4:21:05 PM By: Renne Crigler Signed: 05/02/2017 1:59:17 AM By: Lenda Kelp PA-C Entered By: Renne Crigler on 04/30/2017 16:06:05 Bayon, [name] ([id number]) -------------------------------------------------------------------------------- Problem List Details Patient Name: [name] Baton. Date of Service: 04/30/2017 12:30 PM Medical Record Number: [id number] Patient Account Number: [id number] Date of Birth/Sex: [birth year] (83 y.o. 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 Date of Service: 04/30/2017 12:30 PM Medical Record Number: [id number] Patient Account Number: [id number] Date of Birth/Sex: 03-Dec-1929 (83 y.o. Female) Treating  RN: Renne Crigler Primary Care Provider: Karlene Einstein Other Clinician: Referring Provider: Treating Provider/Extender: Linwood Dibbles, Kaedence Connelly Weeks in Treatment: 0 Subjective Chief Complaint Information obtained from Patient Left foot ulcers History of Present Illness (HPI) 83 year old patient who has significant dementia had been seen recently in the ED for an infected toe and this was the left second toe on the plantar surface.past medical history significant for atrial fibrillation, Alzheimer's disease, carotid artery disease, cardiomyopathy, CHF, dementia, diabetes mellitus, hypertension, status post hysterectomy and pacemaker placement. x-ray done in the ER showed diffuse osteopenia with no definite evidence of osteomyelitis. the patient had strong dorsalis pedis pulse on Doppler, she was placed on Keflex and Bactroban ointment and was asked to see the wound center Readmission: 04/30/17 on evaluation today patient appears to be doing poorly in regard to her left foot. She has several ulcers noted on evaluation today which are very degrees of depth although  the posterior Achilles ulcer does show evidence of tendon involvement at this point. She does not appear to have any evidence of infection. No fevers, chills, nausea, or vomiting noted at this time. She has been seeing her podiatrist Dr. Orland Jarred who did perform the amputation on her left second toe which was back in October 2018 following her first evaluation and our clinic. The state is patient's legal guardian at this point. Wound History Patient presents with 4 open wounds that have been present for approximately one month. Laboratory tests have not been performed in the last month. Patient reportedly has not tested positive for an antibiotic resistant organism. Patient reportedly has not tested positive for osteomyelitis. Patient reportedly has not had testing performed to evaluate circulation in the legs. Patient History Unable to Obtain Patient History due to Dementia. Information obtained from Patient. Allergies No Known Allergies Social History Unknown if ever smoked, Marital Status - Widowed, Alcohol Use - Never, Drug Use - No History, Caffeine Use - Daily. Medical History Eyes Denies history of Cataracts, Glaucoma, Optic Neuritis Respiratory Denies history of Aspiration, Asthma, Pneumothorax, Sleep Apnea, Tuberculosis Cardiovascular Denies history of Angina, Deep Vein Thrombosis, Hypotension, Myocardial Infarction, Peripheral Arterial Disease, Peripheral Venous Disease, Phlebitis, Vasculitis Endocrine Huezo, YVONNIE SCHINKE ([id number]) Denies history of Type I Diabetes Genitourinary Denies history of End Stage Renal Disease Immunological Denies history of Lupus Erythematosus, Raynaud s Musculoskeletal Patient has history of Rheumatoid Arthritis Denies history of Gout, Osteoarthritis, Osteomyelitis Neurologic Denies history of Neuropathy, Quadriplegia, Paraplegia, Seizure Disorder Oncologic Denies history of Received Chemotherapy, Received Radiation Psychiatric Denies history  of Anorexia/bulimia, Confinement Anxiety Patient is treated with Oral Agents. Blood sugar is tested. Review of Systems (ROS) Constitutional Symptoms (General Health) Denies complaints or symptoms of Fatigue, Fever, Chills, Marked Weight Change. Eyes Denies complaints or symptoms of Dry Eyes, Vision Changes, Glasses / Contacts. Respiratory Denies complaints or symptoms of Chronic or frequent coughs, Shortness of Breath. Cardiovascular Denies complaints or symptoms of Chest pain, LE edema. Endocrine Denies complaints or symptoms of Hepatitis, Thyroid disease, Polydypsia (Excessive Thirst). Genitourinary Denies complaints or symptoms of Kidney failure/ Dialysis, Incontinence/dribbling. Immunological Denies complaints or symptoms of Hives, Itching. Musculoskeletal Denies complaints or symptoms of Muscle Pain, Muscle Weakness. Neurologic Denies complaints or symptoms of Numbness/parasthesias, Focal/Weakness. Oncologic The patient has no complaints or symptoms. Psychiatric Denies complaints or symptoms of Anxiety, Claustrophobia. General Notes: patient is contracted with lower extremities Objective Constitutional sitting or standing blood pressure is within target range for patient.. pulse regular and within target range for patient.Marland Kitchen respirations regular, non-labored and within target range  for patient.Marland Kitchen temperature within target range for patient.. Chronically ill appearing but in no apparent acute distress. Vitals Time Taken: 1:05 AM, Height: 62 in, Source: Stated, Weight: 140 lbs, Source: Stated, BMI: 25.6, Pulse: 74 bpm, Respiratory Rate: 18 breaths/min, Blood Pressure: 111/53 mmHg. Dunleavy, [name] ([id number]) Eyes conjunctiva clear no eyelid edema noted. pupils equal round and reactive to light and accommodation. Ears, Nose, Mouth, and Throat no gross abnormality of ear auricles or external auditory canals. mucus membranes moist. Respiratory normal breathing without difficulty.  clear to auscultation bilaterally. Cardiovascular regular rate and rhythm with normal S1, S2. Absent posterior tibial and dorsalis pedis pulses bilateral lower extremities. We could only hear her pulses with the doppler. no clubbing, cyanosis, significant edema, Gastrointestinal (GI) soft, non-tender, non-distended, +BS. no ventral hernia noted. Musculoskeletal Patient unable to walk without assistance. Patients bilateral LEs are severly contracted. Psychiatric this patient is able to make decisions and demonstrates good insight into disease process. Alert and Oriented x 3. pleasant and cooperative. General Notes: Patient's wound shows various degrees of tissue breakdown although the worst seem to be her left Achilles region where the Achilles tendon was actually visible. No sharp debridement was performed on evaluation today. Integumentary (Hair, Skin) Wound #2 status is Open. Original cause of wound was Gradually Appeared. The wound is located on the Left Lower Leg. The wound measures 0.5cm length x 0.5cm width x 0.3cm depth; 0.196cm^2 area and 0.059cm^3 volume. There is Fat Layer (Subcutaneous Tissue) Exposed exposed. There is no tunneling or undermining noted. There is a medium amount of serous drainage noted. The wound margin is flat and intact. There is small (1-33%) granulation within the wound bed. There is a large (67-100%) amount of necrotic tissue within the wound bed including Adherent Slough. The periwound skin appearance exhibited: Ecchymosis. The periwound skin appearance did not exhibit: Callus, Crepitus, Excoriation, Induration, Rash, Scarring, Dry/Scaly, Maceration, Atrophie Blanche, Cyanosis, Hemosiderin Staining, Mottled, Pallor, Rubor, Erythema. Wound #3 status is Open. Original cause of wound was Gradually Appeared. The wound is located on the Left,Lateral Foot. The wound measures 2cm length x 8.5cm width x 0.1cm depth; 13.352cm^2 area and 1.335cm^3 volume. The wound  is limited to skin breakdown. There is no tunneling or undermining noted. There is a none present amount of drainage noted. The wound margin is indistinct and nonvisible. There is no granulation within the wound bed. There is a large (67-100%) amount of necrotic tissue within the wound bed. The periwound skin appearance exhibited: Ecchymosis. The periwound skin appearance did not exhibit: Callus, Crepitus, Excoriation, Induration, Rash, Scarring, Dry/Scaly, Maceration, Atrophie Blanche, Cyanosis, Hemosiderin Staining, Mottled, Pallor, Rubor, Erythema. Periwound temperature was noted as No Abnormality. The periwound has tenderness on palpation. Wound #4 status is Open. Original cause of wound was Gradually Appeared. The wound is located on the Left,Medial Foot. The wound measures 8cm length x 5cm width x 0.1cm depth; 31.416cm^2 area and 3.142cm^3 volume. There is no tunneling or undermining noted. There is a none present amount of drainage noted. The wound margin is indistinct and nonvisible. There is no granulation within the wound bed. There is a large (67-100%) amount of necrotic tissue within the wound bed. The periwound skin appearance exhibited: Ecchymosis, Hemosiderin Staining. The periwound has tenderness on palpation. Wound #5 status is Open. Original cause of wound was Gradually Appeared. The wound is located on the Left Achilles. The wound measures 0.5cm length x 0.7cm width x 0.5cm depth; 0.275cm^2 area and 0.137cm^3 volume. There is tendon and Fat Layer (  Subcutaneous Tissue) Exposed exposed. There is no tunneling or undermining noted. There is a small amount of serous drainage noted. The wound margin is flat and intact. There is no granulation within the wound bed. There is no necrotic tissue within the wound bed. The periwound skin appearance exhibited: Maceration. The periwound skin appearance did not exhibit: Callus, Crepitus, Excoriation, Induration, Rash, Scarring, Dry/Scaly,  Atrophie Blanche, Cyanosis, Ecchymosis, Hemosiderin Staining, Mottled, Pallor, Rubor, Erythema. 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 Cleanse wound with mild soap and water Wound #3 Left,Lateral Foot: Clean wound with Normal Saline. Cleanse wound with mild soap and water Wound #4 Left,Medial Foot: Clean wound with Normal Saline. Cleanse wound with mild soap and water Wound #5 Left Achilles: Clean wound with Normal Saline. Cleanse wound with mild soap and water Anesthetic (add to Medication List): Wound #2 Left Lower Leg: Topical Lidocaine 4% cream applied to wound bed prior to debridement (In Clinic Only). Wound #3 Left,Lateral Foot: Topical Lidocaine 4% cream applied to wound bed prior to debridement (In Clinic Only). Wound #4 Left,Medial Foot: Topical Lidocaine 4% cream applied to wound bed prior to debridement (In Clinic Only). Wound #5 Left Achilles: Topical Lidocaine 4% cream applied to wound bed prior to debridement (In Clinic Only). Primary Wound Dressing: Wound #2 Left Lower Leg: Santyl Ointment Wound #3 Left,Lateral Foot: Santyl Ointment Wound #4 Left,Medial Foot: Santyl Ointment Wound #5 Left Achilles: Other: - silvercell Other: - silvercell Secondary Dressing: Wound #2 Left Lower Leg: ABD pad Non-adherent pad Wound #3 Left,Lateral Foot: ABD pad Feild, Marnita M. ([id number]) Non-adherent pad Wound #4 Left,Medial Foot: ABD pad Non-adherent pad Wound #5 Left Achilles: ABD pad Non-adherent pad Dressing Change Frequency: Wound #2 Left Lower Leg: Three times  weekly Wound #3 Left,Lateral Foot: Three times weekly Wound #4 Left,Medial Foot: Three times weekly Wound #5 Left Achilles: Three times weekly Follow-up Appointments: Return Appointment in 1 week. General Notes: light kerlix wrap At this point I'm gonna recommend that we initiate central ointment treatment with the wounds all except for the left Achilles wound where we will treat this with silver alginate dressings. Will cover this with a light Kerlex wrap not for compression but just to hold everything in place. We will see how things do over the next week. In the interim I would like for her to have an x-ray of this left foot to evaluate the possibility of osteomyelitis due to the severity and number he has. Please see above for specific wound care orders. We will see patient for re-evaluation in 1 week(s) here in the clinic. If anything worsens or changes patient will contact our office for additional recommendations. Electronic Signature(s) Signed: 05/02/2017 1:59:17 AM By: Lenda Kelp PA-C Entered By: Lenda Kelp on 05/02/2017 01:56:50 Gaetz, [name] ([id number]) -------------------------------------------------------------------------------- ROS/PFSH Details Patient Name: [name] Baton. Date of Service: 04/30/2017 12:30 PM Medical Record Number: [id number] Patient Account Number: [id number] Date of Birth/Sex: Feb 07, 1930 (83 y.o. Female) Treating RN: Renne Crigler Primary Care Provider: Karlene Einstein Other Clinician: Referring Provider: Treating Provider/Extender: Linwood Dibbles, Machaela Caterino Weeks in Treatment: 0 Unable to Obtain Patient History due to oo Dementia  Information Obtained From Patient Wound History Do you currently have one or more open woundso Yes How many open wounds do you currently haveo 4 Approximately how long have you had your woundso one month Has your wound(s) ever healed and then re-openedo No Have you had any lab work done in the past montho  No Have you tested positive for an antibiotic resistant organism (MRSA, VRE)o No Have you tested positive for osteomyelitis (bone infection)o No Have you had any tests for circulation on your legso No Constitutional Symptoms (General Health) Complaints and Symptoms: Negative for: Fatigue; Fever; Chills; Marked Weight Change Eyes Complaints and Symptoms: Negative for: Dry Eyes; Vision Changes; Glasses / Contacts Medical History: Negative for: Cataracts; Glaucoma; Optic Neuritis Respiratory Complaints and Symptoms: Negative for: Chronic or frequent coughs; Shortness of Breath Medical History: Positive for: Chronic Obstructive Pulmonary Disease (COPD) Negative for: Aspiration; Asthma; Pneumothorax; Sleep Apnea; Tuberculosis Cardiovascular Complaints and Symptoms: Negative for: Chest pain; LE edema Medical History: Positive for: Arrhythmia - a-fib, sinus tach; Congestive Heart Failure; Coronary Artery Disease; Hypertension Negative for: Angina; Deep Vein Thrombosis; Hypotension; Myocardial Infarction; Peripheral Arterial Disease; Peripheral Venous Disease; Phlebitis; Vasculitis Endocrine Craw, LACHLYN VANDERSTELT. ([id number]) Complaints and Symptoms: Negative for: Hepatitis; Thyroid disease; Polydypsia (Excessive Thirst) Medical History: Positive for: Type II Diabetes Negative for: Type I Diabetes Treated with: Oral agents Blood sugar tested every day: Yes Tested : Genitourinary Complaints and Symptoms: Negative for: Kidney failure/ Dialysis; Incontinence/dribbling Medical History: Negative for: End Stage Renal Disease Immunological Complaints and Symptoms: Negative for: Hives; Itching Medical History: Negative for: Lupus Erythematosus; Raynaudos Musculoskeletal Complaints and Symptoms: Negative for: Muscle Pain; Muscle Weakness Medical History: Positive for: Rheumatoid Arthritis Negative for: Gout; Osteoarthritis; Osteomyelitis Neurologic Complaints and Symptoms: Negative for:  Numbness/parasthesias; Focal/Weakness Medical History: Positive for: Dementia Negative for: Neuropathy; Quadriplegia; Paraplegia; Seizure Disorder Psychiatric Complaints and Symptoms: Negative for: Anxiety; Claustrophobia Medical History: Negative for: Anorexia/bulimia; Confinement Anxiety Oncologic Complaints and Symptoms: No Complaints or Symptoms Medical History: Negative for: Received Chemotherapy; Received Radiation JOEL, MERICLE ([id number]) Immunizations Pneumococcal Vaccine: Received Pneumococcal Vaccination: No Implantable Devices Family and Social History Unknown if ever smoked; Marital Status - Widowed; Alcohol Use: Never; Drug Use: No History; Caffeine Use: Daily; Financial Concerns: No; Food, Clothing or Shelter Needs: No; Support System Lacking: No; Transportation Concerns: No; Advanced Directives: No; Patient does not want information on Advanced Directives; Do not resuscitate: No; Living Will: No; Medical Power of Attorney: Yes (Not Provided) Notes patient is contracted with lower extremities Electronic Signature(s) Signed: 04/30/2017 4:21:05 PM By: Renne Crigler Signed: 05/02/2017 1:59:17 AM By: Lenda Kelp PA-C Entered By: Renne Crigler on 04/30/2017 13:38:42 Cappiello, [name] ([id number]) -------------------------------------------------------------------------------- SuperBill Details Patient Name: [name] Baton. Date of Service: 04/30/2017 Medical Record Number: [id number] Patient Account Number: [id number] Date of Birth/Sex: Nov 25, 1929 (83 y.o. 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 G30.1 Alzheimer's disease with late onset Z95.0 Presence of cardiac pacemaker Facility Procedures CPT4 Code: 35329924 Description: [id number] - WOUND CARE VISIT-LEV 5 EST PT Modifier: Quantity: 1 Physician Procedures CPT4 Code: [id number] Description: 99214 - WC PHYS LEVEL 4 - EST PT ICD-10 Diagnosis Description E11.621 Type 2 diabetes mellitus with foot ulcer L97.523  Non-pressure chronic ulcer of other part of left foot with necr 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

## 2017-05-02 NOTE — Progress Notes (Signed)
ARABELL, NERIA ([id number]) Visit Report for 04/30/2017 Abuse/Suicide Risk Screen Details Patient Name: [name] [name], [name] [name]. Date of Service: 04/30/2017 12:30 PM Medical Record Number: [id number] Patient Account Number: [id number] Date of Birth/Sex: [birth year] (83 y.o. Female) Treating RN: Renne Crigler Primary Care Tariya Morrissette: Karlene Einstein Other Clinician: Referring Darnella Zeiter: Treating Keiton Cosma/Extender: Linwood Dibbles, HOYT Weeks in Treatment: 0 Abuse/Suicide Risk Screen Items Answer ABUSE/SUICIDE RISK SCREEN: Has anyone close to you tried to hurt or harm you recentlyo No Do you feel uncomfortable with anyone in your familyo No Has anyone forced you do things that you didnot want to doo No Do you have any thoughts of harming yourselfo No Patient displays signs or symptoms of abuse and/or neglect. No Electronic Signature(s) Signed: 04/30/2017 4:21:05 PM By: Renne Crigler Entered By: Renne Crigler on 04/30/2017 13:40:17 Yeagle, [name] ([id number]) -------------------------------------------------------------------------------- Activities of Daily Living Details Patient Name: PRIMA, RAYNER. Date of Service: 04/30/2017 12:30 PM Medical Record Number: [id number] Patient Account Number: [id number] Date of Birth/Sex: 05-Apr-1930 (83 y.o. Female) Treating RN: Renne Crigler Primary Care Linsy Ehresman: Karlene Einstein Other Clinician: Referring Tyreece Gelles: Treating Chapel Silverthorn/Extender: Linwood Dibbles, HOYT Weeks in Treatment: 0 Activities of Daily Living Items Answer Activities of Daily Living (Please select one for each item) Drive Automobile Not Able Take Medications Not Able Use Telephone Not Able Care for Appearance Not Able Use Toilet Not Able Bath / Shower Not Able Dress Self Not Able Feed Self Not Able Walk Not Able Get In / Out Bed Not Able Housework Not Able Prepare Meals Not Able Handle Money Not Able Shop for Self Not Able Electronic Signature(s) Signed: 04/30/2017 4:21:05  PM By: Renne Crigler Entered By: Renne Crigler on 04/30/2017 13:40:23 Meda, [name] ([id number]) -------------------------------------------------------------------------------- Education Assessment Details Patient Name: [name]. Date of Service: 04/30/2017 12:30 PM Medical Record Number: [id number] Patient Account Number: [id number] Date of Birth/Sex: 1929-12-06 (83 y.o. Female) Treating RN: Renne Crigler Primary Care Josefa [place]: Karlene Einstein Other Clinician: Referring Estelene Carmack: Treating Tabari Volkert/Extender: Linwood Dibbles, HOYT Weeks in Treatment: 0 Primary Learner Assessed: Caregiver Reason Patient is not Primary Learner: dementia Learning Preferences/Education Level/Primary Language Preferred Language: English Cognitive Barrier Assessment/Beliefs Language Barrier: No Translator Needed: No Memory Deficit: No Emotional Barrier: No Cultural/Religious Beliefs Affecting Medical Care: No Physical Barrier Assessment Impaired Vision: No Impaired Hearing: No Decreased Hand dexterity: No Knowledge/Comprehension Assessment Knowledge Level: Low Comprehension Level: Low Ability to understand written Low instructions: Ability to understand verbal Low instructions: Motivation Assessment Anxiety Level: Calm Cooperation: Cooperative Education Importance: Acknowledges Need Interest in Health Problems: Uninterested Willingness to Engage in Self- Low Management Activities: Readiness to Engage in Self- Low Management Activities: Electronic Signature(s) Signed: 04/30/2017 4:21:05 PM By: Renne Crigler Entered By: Renne Crigler on 04/30/2017 13:40:32 Garate, [name] ([id number]) -------------------------------------------------------------------------------- Fall Risk Assessment Details Patient Name: [name]. Date of Service: 04/30/2017 12:30 PM Medical Record Number: [id number] Patient Account Number: [id number] Date of Birth/Sex: 06-25-1929 (83 y.o.  Female) Treating RN: Renne Crigler Primary Care Nikkia Devoss: Karlene Einstein Other Clinician: Referring Shenekia Riess: Treating Jobina Maita/Extender: Linwood Dibbles, HOYT Weeks in Treatment: 0 Fall Risk Assessment Items Have you had 2 or more falls in the last 12 monthso 0 No Have you had any fall that resulted in injury in the last 12 monthso 0 No FALL RISK ASSESSMENT: History of falling - immediate or within 3 months 0 No Secondary diagnosis 0 No Ambulatory aid None/bed rest/wheelchair/nurse 0 Yes Crutches/cane/walker 0 No Furniture 0 No IV Access/Saline Lock 0 No Gait/Training Normal/bed rest/immobile 0 No Weak 0  No Impaired 0 No Mental Status Oriented to own ability 0 No Electronic Signature(s) Signed: 04/30/2017 4:21:05 PM By: Renne Crigler Entered By: Renne Crigler on 04/30/2017 13:40:41 Zurita, [name] ([id number]) -------------------------------------------------------------------------------- Foot Assessment Details Patient Name: [name]. Date of Service: 04/30/2017 12:30 PM Medical Record Number: [id number] Patient Account Number: [id number] Date of Birth/Sex: [birth year] (83 y.o. Female) Treating RN: Renne Crigler Primary Care Xavyer Steenson: Karlene Einstein Other Clinician: Referring Ayris Carano: Treating Maricia Scotti/Extender: Linwood Dibbles, HOYT Weeks in Treatment: 0 Foot Assessment Items Site Locations + = Sensation present, - = Sensation absent, C = Callus, U = Ulcer R = Redness, W = Warmth, M = Maceration, PU = Pre-ulcerative lesion F = Fissure, S = Swelling, D = Dryness Assessment Right: Left: Other Deformity: No No Prior Foot Ulcer: No Yes Prior Amputation: No No Charcot Joint: No No Ambulatory Status: Non-ambulatory Assistance Device: Wheelchair Gait: Electronic Signature(s) Signed: 04/30/2017 4:21:05 PM By: Renne Crigler Entered By: Renne Crigler on 04/30/2017 13:41:31 Davoli, [name]  ([id number]) -------------------------------------------------------------------------------- Nutrition Risk Assessment Details Patient Name: [name]. Date of Service: 04/30/2017 12:30 PM Medical Record Number: [id number] Patient Account Number: [id number] Date of Birth/Sex: August 14, 1929 (84 y.o. Female) Treating RN: Renne Crigler Primary Care Virga Haltiwanger: Karlene Einstein Other Clinician: Referring Donnajean Chesnut: Treating Ricarda Atayde/Extender: Linwood Dibbles, HOYT Weeks in Treatment: 0 Height (in): 62 Weight (lbs): 140 Body Mass Index (BMI): 25.6 Nutrition Risk Assessment Items NUTRITION RISK SCREEN: I have an illness or condition that made me change the kind and/or amount of 0 No food I eat I eat fewer than two meals per day 0 No I eat few fruits and vegetables, or milk products 0 No I have three or more drinks of beer, liquor or wine almost every day 0 No I have tooth or mouth problems that make it hard for me to eat 0 No I don't always have enough money to buy the food I need 0 No I eat alone most of the time 0 No I take three or more different prescribed or over-the-counter drugs a day 0 No Without wanting to, I have lost or gained 10 pounds in the last six months 0 No I am not always physically able to shop, cook and/or feed myself 0 No Nutrition Protocols Good Risk Protocol 0 No interventions needed Moderate Risk Protocol Electronic Signature(s) Signed: 04/30/2017 4:21:05 PM By: Renne Crigler Entered By: Renne Crigler on 04/30/2017 13:40:48

## 2017-05-06 ENCOUNTER — Ambulatory Visit
Admission: RE | Admit: 2017-05-06 | Discharge: 2017-05-06 | Disposition: A | Discharge status: Home / Self Care | Payer: Medicare Other | Source: Ambulatory Visit | Attending: Physician Assistant | Admitting: Physician Assistant

## 2017-05-06 ENCOUNTER — Other Ambulatory Visit: Payer: Self-pay | Admitting: Physician Assistant

## 2017-05-06 DIAGNOSIS — M869 Osteomyelitis, unspecified: Secondary | ICD-10-CM

## 2017-05-06 DIAGNOSIS — M85872 Other specified disorders of bone density and structure, left ankle and foot: Secondary | ICD-10-CM | POA: Insufficient documentation

## 2017-05-10 ENCOUNTER — Encounter: Payer: Medicare Other | Admitting: Physician Assistant

## 2017-05-10 DIAGNOSIS — E11621 Type 2 diabetes mellitus with foot ulcer: Secondary | ICD-10-CM | POA: Diagnosis not present

## 2017-05-12 NOTE — Progress Notes (Signed)
[name], [name] ([id number]) Visit Report for 05/10/2017 Arrival Information Details Patient Name: [name] [name], [name] [name]. Date of Service: 05/10/2017 2:45 PM Medical Record Number: [id number] Patient Account Number: [id number] Date of Birth/Sex: 1929/12/23 (84 y.o. Female) Treating RN: [name] [name] Primary Care [name] [name]: [name] [name] Other Clinician: Referring [name] [name]: Treating [name] [name]/Extender: [name] [name], [name] [name]: 1 Visit Information History Since Last Visit Added or deleted any medications: No Patient Arrived: Wheel Chair Any new allergies or adverse reactions: No Arrival Time: 14:33 Had a fall or experienced change in No Accompanied By: staff activities of daily living that may affect Transfer Assistance: [name] [name] Lift risk of falls: Patient Identification Verified: Yes Signs or symptoms of abuse/neglect since No Secondary Verification Process Completed: Yes last visito Hospitalized since last visit: No Has Dressing in Place as Prescribed: Yes Pain Present Now: Unable to Respond Electronic Signature(s) Signed: 05/10/2017 4:34:32 PM By: [name] [name] Entered By: [name] [name] on 05/10/2017 14:33:49 [name] [name], [name] [name] ([id number]) -------------------------------------------------------------------------------- Clinic Level of Care Assessment Details Patient Name: [name] [name]. Date of Service: 05/10/2017 2:45 PM Medical Record Number: [id number] Patient Account Number: [id number] Date of Birth/Sex: Sep 19, 1929 (84 y.o. Female) Treating RN: [name] [name] Primary Care [name] [name]: [name] [name] Other Clinician: Referring [name] [name]: Treating [name] [name]/Extender: [name] [name], [name] [name]: 1 Clinic Level of Care Assessment Items TOOL 4 Quantity Score []  - Use when only an EandM is performed on FOLLOW-UP visit 0 ASSESSMENTS - Nursing Assessment / Reassessment X - Reassessment of Co-morbidities (includes updates in patient status) 1 10 X- 1 5 Reassessment  of Adherence to [name] Plan ASSESSMENTS - Wound and Skin Assessment / Reassessment []  - Simple Wound Assessment / Reassessment - one wound 0 X- 4 5 Complex Wound Assessment / Reassessment - multiple wounds []  - 0 Dermatologic / Skin Assessment (not related to wound area) ASSESSMENTS - Focused Assessment []  - Circumferential Edema Measurements - multi extremities 0 []  - 0 Nutritional Assessment / Counseling / Intervention X- 1 5 Lower Extremity Assessment (monofilament, tuning fork, pulses) []  - 0 Peripheral Arterial Disease Assessment (using hand held doppler) ASSESSMENTS - Ostomy and/or Continence Assessment and Care []  - Incontinence Assessment and Management 0 []  - 0 Ostomy Care Assessment and Management (repouching, etc.) PROCESS - Coordination of Care X - Simple Patient / Family Education for ongoing care 1 15 []  - 0 Complex (extensive) Patient / Family Education for ongoing care []  - 0 Staff obtains Chiropractor, Records, Test Results / Process Orders []  - 0 Staff telephones HHA, Nursing Homes / Clarify orders / etc []  - 0 Routine Transfer to another Facility (non-emergent condition) []  - 0 Routine Hospital Admission (non-emergent condition) []  - 0 New Admissions / Manufacturing engineer / Ordering NPWT, Apligraf, etc. []  - 0 Emergency Hospital Admission (emergent condition) X- 1 10 Simple Discharge Coordination [name] [name], [name] [name] ([id number]) []  - 0 Complex (extensive) Discharge Coordination PROCESS - Special Needs []  - Pediatric / Minor Patient Management 0 []  - 0 Isolation Patient Management []  - 0 Hearing / Language / Visual special needs []  - 0 Assessment of Community assistance (transportation, D/C planning, etc.) X- 1 15 Additional assistance / Altered mentation []  - 0 Support Surface(s) Assessment (bed, cushion, seat, etc.) INTERVENTIONS - Wound Cleansing / Measurement []  - Simple Wound Cleansing - one wound 0 X- 4 5 Complex Wound Cleansing - multiple  wounds X- 1 5 Wound Imaging (photographs - any number of wounds) []  - 0 Wound Tracing (instead of photographs) []  - 0 Simple Wound Measurement -  one wound X- 4 5 Complex Wound Measurement - multiple wounds INTERVENTIONS - Wound Dressings X - Small Wound Dressing one or multiple wounds 4 10 []  - 0 Medium Wound Dressing one or multiple wounds []  - 0 Large Wound Dressing one or multiple wounds []  - 0 Application of Medications - topical []  - 0 Application of Medications - injection INTERVENTIONS - Miscellaneous []  - External ear exam 0 []  - 0 Specimen Collection (cultures, biopsies, blood, body fluids, etc.) []  - 0 Specimen(s) / Culture(s) sent or taken to Lab for analysis X- 1 10 Patient Transfer (multiple staff / [name] [name] Lift / Similar devices) []  - 0 Simple Staple / Suture removal (25 or less) []  - 0 Complex Staple / Suture removal (26 or more) []  - 0 Hypo / Hyperglycemic Management (close monitor of Blood Glucose) []  - 0 Ankle / Brachial Index (ABI) - do not check if billed separately X- 1 5 Vital Signs [name] [name], [name] M. ([id number]) Has the patient been seen at the hospital within the last three years: Yes Total Score: 180 Level Of Care: New/Established - Level 5 Electronic Signature(s) Signed: 05/10/2017 4:34:32 PM By: [name] [name] Entered By: [name] [name] on 05/10/2017 15:56:33 [name] [name], [name] [name] ([id number]) -------------------------------------------------------------------------------- Encounter Discharge Information Details Patient Name: [name] [name]. Date of Service: 05/10/2017 2:45 PM Medical Record Number: [id number] Patient Account Number: [id number] Date of Birth/Sex: Sep 24, 1929 (84 y.o. Female) Treating RN: [name] [name] Primary Care [name] [name]: [name] [name] Other Clinician: Referring [name] [name]: Treating [name] [name]/Extender: [name] [name], [name] [name]: 1 Encounter Discharge Information Items Discharge Pain Level: 0 Discharge Condition:  Stable Ambulatory Status: Wheelchair Discharge Destination: Home Transportation: Private Auto Accompanied By: staff Schedule Follow-up Appointment: Yes Medication Reconciliation completed and No provided to Patient/Care Mitchael Luckey: Provided on Clinical Summary of Care: 05/10/2017 Form Type Recipient Paper Patient LG Electronic Signature(s) Signed: 05/10/2017 3:58:28 PM By: [name] [name] Entered By: [name] [name] on 05/10/2017 15:58:28 Bardin, [name] [name] ([id number]) -------------------------------------------------------------------------------- Lower Extremity Assessment Details Patient Name: [name] [name]. Date of Service: 05/10/2017 2:45 PM Medical Record Number: [id number] Patient Account Number: [id number] Date of Birth/Sex: 09-Mar-1930 (84 y.o. Female) Treating RN: [name] [name] Primary Care Porsha Skilton: [name] [name] Other Clinician: Referring Kaelob Persky: Treating Jeidi Gilles/Extender: [name] [name], [name] [name]: 1 Vascular Assessment Pulses: Dorsalis Pedis Palpable: [Left:Yes] Posterior Tibial Extremity colors, hair growth, and conditions: Extremity Color: [Left:Hyperpigmented] Hair Growth on Extremity: [Left:No] Temperature of Extremity: [Left:Warm] Capillary Refill: [Left:< 3 seconds] Toe Nail Assessment Left: Right: Thick: Yes Discolored: Yes Deformed: Yes Improper Length and Hygiene: Yes Electronic Signature(s) Signed: 05/10/2017 4:34:32 PM By: [name] [name] Entered By: [name] [name] on 05/10/2017 14:57:25 Burrowes, [name] [name] ([id number]) -------------------------------------------------------------------------------- Multi Wound Chart Details Patient Name: [name] [name]. Date of Service: 05/10/2017 2:45 PM Medical Record Number: [id number] Patient Account Number: [id number] Date of Birth/Sex: 20-Sep-1929 (84 y.o. Female) Treating RN: [name] [name] Primary Care Ebrima Ranta: [name] [name] Other Clinician: Referring Tawny Raspberry: Treating Kjirsten Bloodgood/Extender:  [name] [name], [name] [name]: 1 Vital Signs Height(in): 62 Pulse(bpm): 79 Weight(lbs): 140 Blood Pressure(mmHg): 92/71 Body Mass Index(BMI): 26 Temperature(F): 97.8 Respiratory Rate 18 (breaths/min): Photos: [2:No Photos] [3:No Photos] [4:No Photos] Wound Location: [2:Left Toe Great] [3:Left Foot - Lateral] [4:Left Foot - Medial] Wounding Event: [2:Gradually Appeared] [3:Gradually Appeared] [4:Gradually Appeared] Primary Etiology: [2:Arterial Insufficiency Ulcer] [3:Arterial Insufficiency Ulcer] [4:Arterial Insufficiency Ulcer] Comorbid History: [2:Chronic Obstructive Pulmonary Disease (COPD), Pulmonary Disease (COPD), Pulmonary Disease (COPD), Arrhythmia, Congestive Heart Arrhythmia, Congestive Heart Arrhythmia, Congestive Heart Failure, Coronary Artery Disease, Hypertension,  Type II Disease, Hypertension,  Type II Disease, Hypertension, Type II Diabetes, Rheumatoid Arthritis, Dementia] [3:Chronic Obstructive Failure, Coronary Artery Diabetes, Rheumatoid Arthritis, Dementia] [4:Chronic Obstructive Failure, Coronary Artery  Diabetes, Rheumatoid Arthritis, Dementia] Date Acquired: [2:04/02/2017] [3:02/08/2017] [4:04/12/2017] Weeks of [name]: [2:1] [3:1] [4:1] Wound Status: [2:Open] [3:Open] [4:Open] Pending Amputation on [2:Yes] [3:Yes] [4:Yes] Presentation: Measurements L x W x D [2:0.5x0.8x0.3] [3:2.3x4.5x0.1] [4:3.2x6x0.3] (cm) Area (cm) : [2:0.314] [3:8.129] [4:15.08] Volume (cm) : [2:0.094] [3:0.813] [4:4.524] % Reduction in Area: [2:-60.20%] [3:39.10%] [4:52.00%] % Reduction in Volume: [2:-59.30%] [3:39.10%] [4:-44.00%] Classification: [2:Full Thickness With Exposed Support Structures] [3:Unclassifiable] [4:Unclassifiable] Exudate Amount: [2:Medium] [3:Large] [4:Large] Exudate Type: [2:Serous] [3:Purulent] [4:Purulent] Exudate Color: [2:amber] [3:yellow, brown, Pavlik] [4:yellow, brown, Blissett] Wound Margin: [2:Flat and Intact] [3:Indistinct, nonvisible]  [4:Indistinct, nonvisible] Granulation Amount: [2:Small (1-33%)] [3:None Present (0%)] [4:None Present (0%)] Necrotic Amount: [2:Large (67-100%)] [3:Large (67-100%)] [4:Large (67-100%)] Exposed Structures: [2:Fat Layer (Subcutaneous Tissue) Exposed: Yes Fascia: No Tendon: No Muscle: No Joint: No Bone: No] [3:Fascia: No Fat Layer (Subcutaneous Tissue) Exposed: No Tendon: No Muscle: No Joint: No] [4:Fat Layer (Subcutaneous Tissue) Exposed: Yes Fascia: No  Tendon: No Muscle: No Joint: No Bone: No] Bone: No Limited to Skin Breakdown Epithelialization: None Small (1-33%) None Periwound Skin Texture: Excoriation: No Excoriation: No No Abnormalities Noted Induration: No Induration: No Callus: No Callus: No Crepitus: No Crepitus: No Rash: No Rash: No Scarring: No Scarring: No Periwound Skin Moisture: Maceration: No Maceration: No No Abnormalities Noted Dry/Scaly: No Dry/Scaly: No Periwound Skin Color: Ecchymosis: Yes Ecchymosis: Yes Ecchymosis: Yes Atrophie Blanche: No Atrophie Blanche: No Hemosiderin Staining: Yes Cyanosis: No Cyanosis: No Erythema: No Erythema: No Hemosiderin Staining: No Hemosiderin Staining: No Mottled: No Mottled: No Pallor: No Pallor: No Rubor: No Rubor: No Temperature: No Abnormality No Abnormality N/A Tenderness on Palpation: Yes Yes Yes Wound Preparation: Ulcer Cleansing: Ulcer Cleansing: Ulcer Cleansing: Rinsed/Irrigated with Saline Rinsed/Irrigated with Saline Rinsed/Irrigated with Saline Topical Anesthetic Applied: Topical Anesthetic Applied: Topical Anesthetic Applied: Other: lidocaine 4% Other: lidocaine 4% Other: lidocaine 4% Wound Number: 5 N/A N/A Photos: No Photos N/A N/A Wound Location: Left Achilles N/A N/A Wounding Event: Gradually Appeared N/A N/A Primary Etiology: Arterial Insufficiency Ulcer N/A N/A Comorbid History: Chronic Obstructive N/A N/A Pulmonary Disease (COPD), Arrhythmia, Congestive Heart Failure, Coronary  Artery Disease, Hypertension, Type II Diabetes, Rheumatoid Arthritis, Dementia Date Acquired: 04/12/2017 N/A N/A Weeks of [name]: 1 N/A N/A Wound Status: Open N/A N/A Pending Amputation on Yes N/A N/A Presentation: Measurements L x W x D 0.5x0.9x0.4 N/A N/A (cm) Area (cm) : 0.353 N/A N/A Volume (cm) : 0.141 N/A N/A % Reduction in Area: -28.40% N/A N/A % Reduction in Volume: -2.90% N/A N/A Classification: Full Thickness With Exposed N/A N/A Support Structures Exudate Amount: Medium N/A N/A Exudate Type: Purulent N/A N/A Exudate Color: yellow, brown, Gernert N/A N/A Wound Margin: Flat and Intact N/A N/A Granulation Amount: None Present (0%) N/A N/A Necrotic Amount: None Present (0%) N/A N/A [name] [name], [name] M. ([id number]) Exposed Structures: Fat Layer (Subcutaneous N/A N/A Tissue) Exposed: Yes Tendon: Yes Fascia: No Muscle: No Joint: No Bone: No Epithelialization: None N/A N/A Periwound Skin Texture: Excoriation: No N/A N/A Induration: No Callus: No Crepitus: No Rash: No Scarring: No Periwound Skin Moisture: Maceration: Yes N/A N/A Dry/Scaly: No Periwound Skin Color: Atrophie Blanche: No N/A N/A Cyanosis: No Ecchymosis: No Erythema: No Hemosiderin Staining: No Mottled: No Pallor: No Rubor: No Temperature: No Abnormality N/A N/A Tenderness on Palpation: Yes N/A N/A Wound Preparation: Ulcer Cleansing: N/A N/A Rinsed/Irrigated with Saline Topical Anesthetic Applied: Other: lidocaine  4% [name] Notes Electronic Signature(s) Signed: 05/10/2017 4:34:32 PM By: [name] [name] Previous Signature: 05/10/2017 2:59:08 PM Version By: [name] [name] Entered By: [name] [name] on 05/10/2017 15:16:51 [name] [name], [name] [name] ([id number]) -------------------------------------------------------------------------------- Multi-Disciplinary Care Plan Details Patient Name: [name] [name]. Date of Service: 05/10/2017 2:45 PM Medical Record Number: [id number] Patient Account Number:  [id number] Date of Birth/Sex: 05/10/1930 (84 y.o. Female) Treating RN: [name] [name] Primary Care Viana Sleep: [name] [name] Other Clinician: Referring Bedelia Pong: Treating Summers Buendia/Extender: [name] [name], [name] [name]: 1 Active Inactive ` Orientation to the Wound Care Program Nursing Diagnoses: Knowledge deficit related to the wound healing center program Goals: Patient/caregiver will verbalize understanding of the Wound Healing Center Program Date Initiated: 04/30/2017 Target Resolution Date: 05/31/2017 Goal Status: Active Interventions: Provide education on orientation to the wound center Notes: ` Wound/Skin Impairment Nursing Diagnoses: Impaired tissue integrity Knowledge deficit related to ulceration/compromised skin integrity Goals: Patient/caregiver will verbalize understanding of skin care regimen Date Initiated: 04/30/2017 Target Resolution Date: 05/31/2017 Goal Status: Active Ulcer/skin breakdown will have a volume reduction of 30% by week 4 Date Initiated: 04/30/2017 Target Resolution Date: 05/31/2017 Goal Status: Active Interventions: Assess patient/caregiver ability to obtain necessary supplies Assess patient/caregiver ability to perform ulcer/skin care regimen upon admission and as needed Assess ulceration(s) every visit Provide education on ulcer and skin care [name] Activities: Skin care regimen initiated : 04/30/2017 Notes: Electronic Signature(s) UNICE, KOELZER ([id number]) Signed: 05/10/2017 2:59:01 PM By: [name] [name] Entered By: [name] [name] on 05/10/2017 14:59:01 [name] [name], [name] [name] ([id number]) -------------------------------------------------------------------------------- Pain Assessment Details Patient Name: [name] [name]. Date of Service: 05/10/2017 2:45 PM Medical Record Number: [id number] Patient Account Number: [id number] Date of Birth/Sex: 01-Aug-1929 (84 y.o. Female) Treating RN: [name] [name] Primary Care Kim Oki: [name] [name] Other Clinician: Referring Shanedra Lave: Treating Phenix Grein/Extender: [name] [name], [name] [name]: 1 Active Problems Location of Pain Severity and Description of Pain Patient Has Paino Patient Unable to Respond Site Locations Pain Management and Medication Current Pain Management: Notes Topical or injectable lidocaine is offered to patient for acute pain when surgical debridement is performed. If needed, Patient is instructed to use over the counter pain medication for the following 24-48 hours after debridement. Wound care MDs do not prescribed pain medications. Patient has chronic pain or uncontrolled pain. Patient has been instructed to make an appointment with their Primary Care Physician for pain management. Electronic Signature(s) Signed: 05/10/2017 4:34:32 PM By: [name] [name] Entered By: [name] [name] on 05/10/2017 14:35:51 [name] [name], [name] [name] ([id number]) -------------------------------------------------------------------------------- Patient/Caregiver Education Details Patient Name: [name] [name]. Date of Service: 05/10/2017 2:45 PM Medical Record Number: [id number] Patient Account Number: [id number] Date of Birth/Gender: 23-Feb-1930 (84 y.o. Female) Treating RN: [name] [name] Primary Care Physician: [name] [name] Other Clinician: Referring Physician: Treating Physician/Extender: Skeet Simmer in [name]: 1 Education Assessment Education Provided To: Caregiver Education Topics Provided Wound/Skin Impairment: Handouts: Other: wound care as ordered Methods: Explain/Verbal, Printed Responses: State content correctly Electronic Signature(s) Signed: 05/10/2017 4:34:32 PM By: [name] [name] Entered By: [name] [name] on 05/10/2017 15:58:50 [name] [name], [name] [name] ([id number]) -------------------------------------------------------------------------------- Wound Assessment Details Patient Name: [name] [name]. Date of Service: 05/10/2017 2:45 PM Medical Record  Number: [id number] Patient Account Number: [id number] Date of Birth/Sex: 11/24/1929 (84 y.o. Female) Treating RN: [name] [name] Primary Care Tamey Wanek: [name] [name] Other Clinician: Referring Chaney Maclaren: Treating Tishie Altmann/Extender: [name] [name], [name] [name]: 1 Wound Status Wound Number: 2 Primary Arterial Insufficiency Ulcer Etiology: Wound Location: Left Toe Great Wound Open Wounding Event: Gradually Appeared Status: Date Acquired: 04/02/2017 Comorbid Chronic  Obstructive Pulmonary Disease (COPD), Weeks Of [name]: 1 History: Arrhythmia, Congestive Heart Failure, Coronary Clustered Wound: No Artery Disease, Hypertension, Type II Diabetes, Pending Amputation On Presentation Rheumatoid Arthritis, Dementia Photos Photo Uploaded By: [name] [name] on 05/10/2017 16:32:34 Wound Measurements Length: (cm) 0.5 Width: (cm) 0.8 Depth: (cm) 0.3 Area: (cm) 0.314 Volume: (cm) 0.094 % Reduction in Area: -60.2% % Reduction in Volume: -59.3% Epithelialization: None Tunneling: No Undermining: No Wound Description Full Thickness With Exposed Support Classification: Structures Wound Margin: Flat and Intact Exudate Medium Amount: Exudate Type: Serous Exudate Color: amber Foul Odor After Cleansing: No Slough/Fibrino Yes Wound Bed Granulation Amount: Small (1-33%) Exposed Structure Necrotic Amount: Large (67-100%) Fascia Exposed: No Necrotic Quality: Adherent Slough Fat Layer (Subcutaneous Tissue) Exposed: Yes Tendon Exposed: No Muscle Exposed: No Joint Exposed: No [name] [name], [name] M. ([id number]) Bone Exposed: No Periwound Skin Texture Texture Color No Abnormalities Noted: No No Abnormalities Noted: No Callus: No Atrophie Blanche: No Crepitus: No Cyanosis: No Excoriation: No Ecchymosis: Yes Induration: No Erythema: No Rash: No Hemosiderin Staining: No Scarring: No Mottled: No Pallor: No Moisture Rubor: No No Abnormalities Noted: No Dry / Scaly: No  Temperature / Pain Maceration: No Temperature: No Abnormality Tenderness on Palpation: Yes Wound Preparation Ulcer Cleansing: Rinsed/Irrigated with Saline Topical Anesthetic Applied: Other: lidocaine 4%, [name] Notes Wound #2 (Left Toe Great) 1. Cleansed with: Clean wound with Normal Saline 2. Anesthetic Topical Lidocaine 4% cream to wound bed prior to debridement 4. Dressing Applied: Santyl Ointment 5. Secondary Dressing Applied ABD Pad Kerlix/Conform Non-Adherent pad 7. Secured with Tape Notes netting Electronic Signature(s) Signed: 05/10/2017 4:34:32 PM By: [name] [name] Entered By: [name] [name] on 05/10/2017 15:16:13 Kerce, [name] [name] ([id number]) -------------------------------------------------------------------------------- Wound Assessment Details Patient Name: [name] [name]. Date of Service: 05/10/2017 2:45 PM Medical Record Number: [id number] Patient Account Number: [id number] Date of Birth/Sex: 1929-09-07 (84 y.o. Female) Treating RN: [name] [name] Primary Care Joshau Code: [name] [name] Other Clinician: Referring Lianette Broussard: Treating Buford Gayler/Extender: [name] [name], [name] [name]: 1 Wound Status Wound Number: 3 Primary Arterial Insufficiency Ulcer Etiology: Wound Location: Left Foot - Lateral Wound Open Wounding Event: Gradually Appeared Status: Date Acquired: 02/08/2017 Comorbid Chronic Obstructive Pulmonary Disease (COPD), Weeks Of [name]: 1 History: Arrhythmia, Congestive Heart Failure, Coronary Clustered Wound: No Artery Disease, Hypertension, Type II Diabetes, Pending Amputation On Presentation Rheumatoid Arthritis, Dementia Photos Photo Uploaded By: [name] [name] on 05/10/2017 16:32:34 Wound Measurements Length: (cm) 2.3 Width: (cm) 4.5 Depth: (cm) 0.1 Area: (cm) 8.129 Volume: (cm) 0.813 % Reduction in Area: 39.1% % Reduction in Volume: 39.1% Epithelialization: Small (1-33%) Tunneling: No Undermining: No Wound  Description Classification: Unclassifiable Foul O Wound Margin: Indistinct, nonvisible Slough Exudate Amount: Large Exudate Type: Purulent Exudate Color: yellow, brown, Rhyner dor After Cleansing: No /Fibrino No Wound Bed Granulation Amount: None Present (0%) Exposed Structure Necrotic Amount: Large (67-100%) Fascia Exposed: No Fat Layer (Subcutaneous Tissue) Exposed: No Tendon Exposed: No Muscle Exposed: No Joint Exposed: No Bone Exposed: No Limited to Skin Breakdown Elster, Eulanda M. ([id number]) Periwound Skin Texture Texture Color No Abnormalities Noted: No No Abnormalities Noted: No Callus: No Atrophie Blanche: No Crepitus: No Cyanosis: No Excoriation: No Ecchymosis: Yes Induration: No Erythema: No Rash: No Hemosiderin Staining: No Scarring: No Mottled: No Pallor: No Moisture Rubor: No No Abnormalities Noted: No Dry / Scaly: No Temperature / Pain Maceration: No Temperature: No Abnormality Tenderness on Palpation: Yes Wound Preparation Ulcer Cleansing: Rinsed/Irrigated with Saline Topical Anesthetic Applied: Other: lidocaine 4%, [name] Notes Wound #3 (Left, Lateral Foot) 1. Cleansed with: Clean  wound with Normal Saline 2. Anesthetic Topical Lidocaine 4% cream to wound bed prior to debridement 4. Dressing Applied: Santyl Ointment 5. Secondary Dressing Applied ABD Pad Kerlix/Conform Non-Adherent pad 7. Secured with Tape Notes netting Electronic Signature(s) Signed: 05/10/2017 4:34:32 PM By: [name] [name] Entered By: [name] [name] on 05/10/2017 14:51:09 [name] [name], [name] [name] ([id number]) -------------------------------------------------------------------------------- Wound Assessment Details Patient Name: [name] [name]. Date of Service: 05/10/2017 2:45 PM Medical Record Number: [id number] Patient Account Number: [id number] Date of Birth/Sex: [birth year] (84 y.o. Female) Treating RN: [name] [name] Primary Care Candyce Gambino: [name] [name] Other  Clinician: Referring Meleny Tregoning: Treating Leylah Tarnow/Extender: [name] [name], [name] [name]: 1 Wound Status Wound Number: 4 Primary Arterial Insufficiency Ulcer Etiology: Wound Location: Left Foot - Medial Wound Open Wounding Event: Gradually Appeared Status: Date Acquired: 04/12/2017 Comorbid Chronic Obstructive Pulmonary Disease (COPD), Weeks Of [name]: 1 History: Arrhythmia, Congestive Heart Failure, Coronary Clustered Wound: No Artery Disease, Hypertension, Type II Diabetes, Pending Amputation On Presentation Rheumatoid Arthritis, Dementia Photos Photo Uploaded By: [name] [name] on 05/10/2017 16:32:56 Wound Measurements Length: (cm) 3.2 Width: (cm) 6 Depth: (cm) 0.3 Area: (cm) 15.08 Volume: (cm) 4.524 % Reduction in Area: 52% % Reduction in Volume: -44% Epithelialization: None Tunneling: No Undermining: No Wound Description Classification: Unclassifiable Wound Margin: Indistinct, nonvisible Exudate Amount: Large Exudate Type: Purulent Exudate Color: yellow, brown, Kutch Foul Odor After Cleansing: No Slough/Fibrino No Wound Bed Granulation Amount: None Present (0%) Exposed Structure Necrotic Amount: Large (67-100%) Fascia Exposed: No Fat Layer (Subcutaneous Tissue) Exposed: Yes Tendon Exposed: No Muscle Exposed: No Joint Exposed: No Bone Exposed: No [name] [name], [name] M. ([id number]) Periwound Skin Texture Texture Color No Abnormalities Noted: No No Abnormalities Noted: No Ecchymosis: Yes Moisture Hemosiderin Staining: Yes No Abnormalities Noted: No Temperature / Pain Tenderness on Palpation: Yes Wound Preparation Ulcer Cleansing: Rinsed/Irrigated with Saline Topical Anesthetic Applied: Other: lidocaine 4%, [name] Notes Wound #4 (Left, Medial Foot) 1. Cleansed with: Clean wound with Normal Saline 2. Anesthetic Topical Lidocaine 4% cream to wound bed prior to debridement 4. Dressing Applied: Santyl Ointment 5. Secondary Dressing  Applied ABD Pad Kerlix/Conform Non-Adherent pad 7. Secured with Tape Notes netting Electronic Signature(s) Signed: 05/10/2017 4:34:32 PM By: [name] [name] Entered By: [name] [name] on 05/10/2017 14:52:23 [name] [name], [name] [name] ([id number]) -------------------------------------------------------------------------------- Wound Assessment Details Patient Name: [name] [name]. Date of Service: 05/10/2017 2:45 PM Medical Record Number: [id number] Patient Account Number: [id number] Date of Birth/Sex: [birth year] (84 y.o. Female) Treating RN: [name] [name] Primary Care Korissa Horsford: [name] [name] Other Clinician: Referring Crystalee Ventress: Treating Khloei Spiker/Extender: [name] [name], [name] [name]: 1 Wound Status Wound Number: 5 Primary Arterial Insufficiency Ulcer Etiology: Wound Location: Left Achilles Wound Open Wounding Event: Gradually Appeared Status: Date Acquired: 04/12/2017 Comorbid Chronic Obstructive Pulmonary Disease (COPD), Weeks Of [name]: 1 History: Arrhythmia, Congestive Heart Failure, Coronary Clustered Wound: No Artery Disease, Hypertension, Type II Diabetes, Pending Amputation On Presentation Rheumatoid Arthritis, Dementia Photos Photo Uploaded By: [name] [name] on 05/10/2017 16:32:56 Wound Measurements Length: (cm) 0.5 Width: (cm) 0.9 Depth: (cm) 0.4 Area: (cm) 0.353 Volume: (cm) 0.141 % Reduction in Area: -28.4% % Reduction in Volume: -2.9% Epithelialization: None Tunneling: No Undermining: No Wound Description Full Thickness With Exposed Support Classification: Structures Wound Margin: Flat and Intact Exudate Medium Amount: Exudate Type: Purulent Exudate Color: yellow, brown, [place] Foul Odor After Cleansing: No Slough/Fibrino Yes Wound Bed Granulation Amount: None Present (0%) Exposed Structure Necrotic Amount: None Present (0%) Fascia Exposed: No Fat Layer (Subcutaneous Tissue) Exposed: Yes Tendon Exposed: Yes Muscle Exposed: No Joint  Exposed: No Scholer,  [name] [name] ([id number]) Bone Exposed: No Periwound Skin Texture Texture Color No Abnormalities Noted: No No Abnormalities Noted: No Callus: No Atrophie Blanche: No Crepitus: No Cyanosis: No Excoriation: No Ecchymosis: No Induration: No Erythema: No Rash: No Hemosiderin Staining: No Scarring: No Mottled: No Pallor: No Moisture Rubor: No No Abnormalities Noted: No Dry / Scaly: No Temperature / Pain Maceration: Yes Temperature: No Abnormality Tenderness on Palpation: Yes Wound Preparation Ulcer Cleansing: Rinsed/Irrigated with Saline Topical Anesthetic Applied: Other: lidocaine 4%, [name] Notes Wound #5 (Left Achilles) 1. Cleansed with: Clean wound with Normal Saline 2. Anesthetic Topical Lidocaine 4% cream to wound bed prior to debridement 4. Dressing Applied: Other dressing (specify in notes) 5. Secondary Dressing Applied Dry Gauze Kerlix/Conform Notes silvercell, netting Electronic Signature(s) Signed: 05/10/2017 4:34:32 PM By: [name] [name] Entered By: [name] [name] on 05/10/2017 15:16:41 Bucknam, [name] [name] ([id number]) -------------------------------------------------------------------------------- Vitals Details Patient Name: [name] [name]. Date of Service: 05/10/2017 2:45 PM Medical Record Number: [id number] Patient Account Number: [id number] Date of Birth/Sex: 1930-02-05 (84 y.o. Female) Treating RN: [name] [name] Primary Care Suren Payne: [name] [name] Other Clinician: Referring Philipp Callegari: Treating Jonael Paradiso/Extender: [name] [name], [name] [name]: 1 Vital Signs Time Taken: 14:35 Temperature (F): 97.8 Height (in): 62 Pulse (bpm): 79 Weight (lbs): 140 Respiratory Rate (breaths/min): 18 Body Mass Index (BMI): 25.6 Blood Pressure (mmHg): 92/71 Reference Range: 80 - 120 mg / dl Electronic Signature(s) Signed: 05/10/2017 4:34:32 PM By: [name] [name] Entered By: [name] [name] on 05/10/2017 14:37:09

## 2017-05-12 NOTE — Progress Notes (Signed)
[name] [name] ([id number]) Visit Report for 05/10/2017 Chief Complaint Document Details Patient Name: [name] [name], [name] [name]. Date of Service: 05/10/2017 2:45 PM Medical Record Number: [id number] Patient Account Number: [id number] Date of Birth/Sex: 02/27/1930 (84 y.o. Female) Treating RN: [name] [name] Primary Care Provider: Karlene Einstein Other Clinician: Referring Provider: Treating Provider/Extender: Linwood Dibbles, Edmonia Gonser Weeks in Treatment: 1 Information Obtained from: Patient Chief Complaint Left foot ulcers Electronic Signature(s) Signed: 05/12/2017 11:24:19 AM By: Lenda Kelp PA-C Entered By: Lenda Kelp on 05/10/2017 15:03:59 [name], [name] [name] ([id number]) -------------------------------------------------------------------------------- HPI Details Patient Name: [name] [name]. Date of Service: 05/10/2017 2:45 PM Medical Record Number: [id number] Patient Account Number: [id number] Date of Birth/Sex: [birth year] (84 y.o. Female) Treating RN: [name] [name] Primary Care Provider: Karlene Einstein Other Clinician: Referring Provider: Treating Provider/Extender: Linwood Dibbles, Parsa Rickett Weeks in Treatment: 1 History of Present Illness HPI Description: 84 year old patient who has significant dementia had been seen recently in the ED for an infected toe and this was the left second toe on the plantar surface.past medical history significant for atrial fibrillation, Alzheimer's disease, carotid artery disease, cardiomyopathy, CHF, dementia, diabetes mellitus, hypertension, status post hysterectomy and pacemaker placement. x-ray done in the ER showed diffuse osteopenia with no definite evidence of osteomyelitis. the patient had strong dorsalis pedis pulse on Doppler, she was placed on Keflex and Bactroban ointment and was asked to see the wound center Readmission: 04/30/17 on evaluation today patient appears to be doing poorly in regard to her left foot. She has several ulcers noted on evaluation  today which are very degrees of depth although the posterior Achilles ulcer does show evidence of tendon involvement at this point. She does not appear to have any evidence of infection. No fevers, chills, nausea, or vomiting noted at this time. She has been seeing her podiatrist Dr. Orland Jarred who did perform the amputation on her left second toe which was back in October 2018 following her first evaluation and our clinic. The state is patient's legal guardian at this point. 05/10/17 on evaluation today I did have patientos left foot and left ankle x-rays for review. Left foot x-ray revealed osteopenia with sub optimal evaluation of the distal digits due to positioning no definite osseous abnormality. Left ankle x-ray revealed diffuse osteopenia with no definite osseous anomaly. There was no obvious osteomyelitis noted of either site per the reports. With that being said patient's wounds in general do not appear to have shown signs of significant improvement compared to my last evaluation roughly 2 weeks ago. She again has previously seen Dr. Orland Jarred though not specifically for everything that is going on at this time. In regard to the x-rays he was difficult to position her due to her severe contraction and therefore I'm also unsure whether or not Dr. Orland Jarred would even recommend MRI I do not believe she would be able to hold still for an MRI. Nonetheless at this point the Santyl does seem to be helping with loosening up some of the eschar on the medial aspect of the foot wound. The lateral aspect still is fairly firmly attached. The area on the lateral aspect of the first toe seems to be cleaning up a bit but still has slough noted. No fevers, chills, nausea, or vomiting noted at this time. Electronic Signature(s) Signed: 05/12/2017 11:24:19 AM By: Lenda Kelp PA-C Entered By: Lenda Kelp on 05/10/2017 15:23:32 [name], [name] [name]  ([id number]) -------------------------------------------------------------------------------- Physical Exam Details Patient Name: [name] [name]. Date of Service: 05/10/2017 2:45 PM Medical  Record Number: [id number] Patient Account Number: [id number] Date of Birth/Sex: [birth year] (84 y.o. Female) Treating RN: [name] [name] Primary Care Provider: Karlene Einstein Other Clinician: Referring Provider: Treating Provider/Extender: Linwood Dibbles, [name] Weeks in Treatment: 1 Constitutional Chronically ill appearing but in no apparent acute distress. Respiratory normal breathing without difficulty. clear to auscultation bilaterally. Cardiovascular regular rate and rhythm with normal S1, S2. Psychiatric Patient is not able to cooperate in decision making regarding care. Patient has dementia. patient is confused and aggitated due to pain. Notes At this point patient still has eschar covering the lateral and medial foot wounds. There is still slough noted in the great toe wound laterally but no sharp debridement was performed at this point. Patient was slightly combative when attempting to clean the wounds due to pain and therefore I not even sure that she would be able to tolerate debridement. There is no evidence of infection significantly. Electronic Signature(s) Signed: 05/12/2017 11:24:19 AM By: Lenda Kelp PA-C Entered By: Lenda Kelp on 05/10/2017 15:24:59 [name], [name] [name] ([id number]) -------------------------------------------------------------------------------- Physician Orders Details Patient Name: [name] [name]. Date of Service: 05/10/2017 2:45 PM Medical Record Number: [id number] Patient Account Number: [id number] Date of Birth/Sex: 16-Mar-1930 (84 y.o. 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 o Cleanse wound with mild soap and water o May Shower, gently pat wound dry prior to applying new dressing. Wound #3 Left,Lateral Foot o Clean wound with Normal Saline. o Cleanse wound with mild soap and water o May Shower, gently pat wound dry prior to applying new dressing. Wound #4 Left,Medial Foot o Clean wound with Normal Saline. o Cleanse wound with mild soap and water o May Shower, gently pat wound dry prior to applying new dressing. Wound #5 Left Achilles o Clean wound with Normal Saline. o Cleanse wound with mild soap and water o May Shower, gently pat wound dry prior to applying new dressing. Anesthetic (add to Medication List) Wound #2 Left Toe Great o Topical Lidocaine 4% cream applied to wound bed prior to debridement (In Clinic Only). Wound #3 Left,Lateral Foot o Topical Lidocaine 4% cream applied to wound bed prior to debridement (In Clinic Only). Wound #4 Left,Medial Foot o Topical Lidocaine 4% cream applied to wound bed prior to debridement (In Clinic Only). Wound #5 Left Achilles o Topical Lidocaine 4% cream applied to wound bed prior to debridement (In Clinic Only). Primary Wound Dressing MALLOREE, RABOIN. ([id number]) Wound #2 Left Toe Great o Santyl Ointment Wound #3 Left,Lateral Foot o Santyl Ointment Wound #4 Left,Medial Foot o Santyl Ointment Wound #5 Left Achilles o Silvercel Non-Adherent - or silver alginate Secondary Dressing Wound #2 Left Toe Great o ABD pad o Non-adherent pad Wound #3 Left,Lateral Foot o ABD  pad o Non-adherent pad Wound #4 Left,Medial Foot o ABD pad o Non-adherent pad Wound #5 Left Achilles o ABD pad o Non-adherent pad Dressing Change Frequency Wound #2 Left Toe Great o Three times weekly Wound #3 Left,Lateral Foot o Three times weekly Wound #4 Left,Medial Foot   o Three times weekly Wound #5 Left Achilles o Three times weekly Follow-up Appointments Wound #2 Left Toe Great o Return Appointment in 1 week. Wound #3 Left,Lateral Foot o Return Appointment in 1 week. Wound #4 Left,Medial Foot o Return Appointment in 1 week. Wound #5 Left Achilles o Return Appointment in 1 week. Home Health LUCI, BELLUCCI ([id number]) Wound #2 Left Toe Doretha Sou Continue [facility] Vis[facility] - [facility] o Home Health Nurse may visit PRN to address patientos wound care needs. o FACE TO FACE ENCOUNTER: MEDICARE and MEDICAID PATIENTS: I certify that this patient is under my care and that I had a face-to-face encounter that meets the physician face-to-face encounter requirements with this patient on this date. The encounter with the patient was in whole or in part for the following MEDICAL CONDITION: (primary reason for Home Healthcare) MEDICAL NECESSITY: I certify, that based on my findings, NURSING services are a medically necessary home health service. HOME BOUND STATUS: I certify that my clinical findings support that this patient is homebound (i.e., Due to illness or injury, pt requires aid of supportive devices such as crutches, cane, wheelchairs, walkers, the use of special transportation or the assistance of another person to leave their place of residence. There is a normal inability to leave the home and doing so requires considerable and taxing effort. Other absences are for medical reasons / religious services and are infrequent or of short duration when for other reasons). o If current dressing causes regression in wound condition, may D/C ordered  dressing product/s and apply Normal Saline Moist Dressing daily until next Wound Healing Center / Other MD appointment. Notify Wound Healing Center of regression in wound condition at [phone]. o Please direct any NON-WOUND related issues/requests for orders to patient's Primary Care Physician Wound #3 Left,Lateral Foot o Continue [facility] Vis[facility] - [facility] o Home Health Nurse may visit PRN to address patientos wound care needs. o FACE TO FACE ENCOUNTER: MEDICARE and MEDICAID PATIENTS: I certify that this patient is under my care and that I had a face-to-face encounter that meets the physician face-to-face encounter requirements with this patient on this date. The encounter with the patient was in whole or in part for the following MEDICAL CONDITION: (primary reason for Home Healthcare) MEDICAL NECESSITY: I certify, that based on my findings, NURSING services are a medically necessary home health service. HOME BOUND STATUS: I certify that my clinical findings support that this patient is homebound (i.e., Due to illness or injury, pt requires aid of supportive devices such as crutches, cane, wheelchairs, walkers, the use of special transportation or the assistance of another person to leave their place of residence. There is a normal inability to leave the home and doing so requires considerable and taxing effort. Other absences are for medical reasons / religious services and are infrequent or of short duration when for other reasons). o If current dressing causes regression in wound condition, may D/C ordered dressing product/s and apply Normal Saline Moist Dressing daily until next Wound Healing Center / Other MD appointment. Notify Wound Healing Center of regression in wound condition at [phone]. o Please direct any NON-WOUND related issues/requests for orders to patient's Primary Care Physician Wound #4 Left,Medial Foot o Continue [facility] Vis[facility] - [facility] o  Home Health Nurse may visit PRN to address patientos wound care needs. o FACE TO FACE ENCOUNTER: MEDICARE and MEDICAID PATIENTS: I certify that this patient is under my care and that I had a face-to-face encounter that  meets the physician face-to-face encounter requirements with this patient on this date. The encounter with the patient was in whole or in part for the following MEDICAL CONDITION: (primary reason for Home Healthcare) MEDICAL NECESSITY: I certify, that based on my findings, NURSING services are a medically necessary home health service. HOME BOUND STATUS: I certify that my clinical findings support that this patient is homebound (i.e., Due to illness or injury, pt requires aid of supportive devices such as crutches, cane, wheelchairs, walkers, the use of special transportation or the assistance of another person to leave their place of residence. There is a normal inability to leave the home and doing so requires considerable and taxing effort. Other absences are for medical reasons / religious services and are infrequent or of short duration when for other reasons). o If current dressing causes regression in wound condition, may D/C ordered dressing product/s and apply Normal Saline Moist Dressing daily until next Wound Healing Center / Other MD appointment. Notify Wound Healing Center of regression in wound condition at [phone]. o Please direct any NON-WOUND related issues/requests for orders to patient's Primary Care Physician Wound #5 Left Achilles o Continue [facility] Vis[facility] - [facility] o Home Health Nurse may visit PRN to address patientos wound care needs. BRANDIN, DILDAY ([id number]) o FACE TO FACE ENCOUNTER: MEDICARE and MEDICAID PATIENTS: I certify that this patient is under my care and that I had a face-to-face encounter that meets the physician face-to-face encounter requirements with this patient on this date. The encounter with the patient was in whole  or in part for the following MEDICAL CONDITION: (primary reason for Home Healthcare) MEDICAL NECESSITY: I certify, that based on my findings, NURSING services are a medically necessary home health service. HOME BOUND STATUS: I certify that my clinical findings support that this patient is homebound (i.e., Due to illness or injury, pt requires aid of supportive devices such as crutches, cane, wheelchairs, walkers, the use of special transportation or the assistance of another person to leave their place of residence. There is a normal inability to leave the home and doing so requires considerable and taxing effort. Other absences are for medical reasons / religious services and are infrequent or of short duration when for other reasons). o If current dressing causes regression in wound condition, may D/C ordered dressing product/s and apply Normal Saline Moist Dressing daily until next Wound Healing Center / Other MD appointment. Notify Wound Healing Center of regression in wound condition at [phone]. o Please direct any NON-WOUND related issues/requests for orders to patient's Primary Care Physician Medications-please add to medication list. Wound #2 Left Toe Great o Santyl Enzymatic Ointment Wound #3 Left,Lateral Foot o Santyl Enzymatic Ointment Wound #4 Left,Medial Foot o Santyl Enzymatic Ointment Consults o Water quality scientist) Signed: 05/10/2017 4:34:32 PM By: [name] [name] Signed: 05/12/2017 11:24:19 AM By: Lenda Kelp PA-C Entered By: [name] [name] on 05/10/2017 15:19:14 Claar, [name] [name] ([id number]) -------------------------------------------------------------------------------- Problem List Details Patient Name: [name] [name]. Date of Service: 05/10/2017 2:45 PM Medical Record Number: [id number] Patient Account Number: [id number] Date of Birth/Sex: Aug 22, 1929 (84 y.o. Female) Treating RN: [name] [name] Primary Care Provider: Karlene Einstein  Other Clinician: Referring Provider: Treating Provider/Extender: Skeet Simmer in Treatment: 1 Active Problems ICD-10 Encounter Code Description Active Date Diagnosis E11.621 Type 2 diabetes mellitus with foot ulcer 05/02/2017 Yes L97.523 Non-pressure chronic ulcer of other part of left foot with necrosis of 05/02/2017 Yes muscle L97.323 Non-pressure chronic ulcer of left ankle with necrosis  of muscle 05/02/2017 Yes G30.1 Alzheimer's disease with late onset 05/02/2017 Yes Z95.0 Presence of cardiac pacemaker 05/02/2017 Yes Inactive Problems Resolved Problems Electronic Signature(s) Signed: 05/12/2017 11:24:19 AM By: Lenda Kelp PA-C Entered By: Lenda Kelp on 05/10/2017 15:03:53 Arenson, [name] [name] ([id number]) -------------------------------------------------------------------------------- Progress Note Details Patient Name: [name] [name]. Date of Service: 05/10/2017 2:45 PM Medical Record Number: [id number] Patient Account Number: [id number] Date of Birth/Sex: [birth year] (84 y.o. Female) Treating RN: [name] [name] Primary Care Provider: Karlene Einstein Other Clinician: Referring Provider: Treating Provider/Extender: Linwood Dibbles, Jenaveve Fenstermaker Weeks in Treatment: 1 Subjective Chief Complaint Information obtained from Patient Left foot ulcers History of Present Illness (HPI) 84 year old patient who has significant dementia had been seen recently in the ED for an infected toe and this was the left second toe on the plantar surface.past medical history significant for atrial fibrillation, Alzheimer's disease, carotid artery disease, cardiomyopathy, CHF, dementia, diabetes mellitus, hypertension, status post hysterectomy and pacemaker placement. x-ray done in the ER showed diffuse osteopenia with no definite evidence of osteomyelitis. the patient had strong dorsalis pedis pulse on Doppler, she was placed on Keflex and Bactroban ointment and was asked to see the wound  center Readmission: 04/30/17 on evaluation today patient appears to be doing poorly in regard to her left foot. She has several ulcers noted on evaluation today which are very degrees of depth although the posterior Achilles ulcer does show evidence of tendon involvement at this point. She does not appear to have any evidence of infection. No fevers, chills, nausea, or vomiting noted at this time. She has been seeing her podiatrist Dr. Orland Jarred who did perform the amputation on her left second toe which was back in October 2018 following her first evaluation and our clinic. The state is patient's legal guardian at this point. 05/10/17 on evaluation today I did have patient s left foot and left ankle x-rays for review. Left foot x-ray revealed osteopenia with sub optimal evaluation of the distal digits due to positioning no definite osseous abnormality. Left ankle x-ray revealed diffuse osteopenia with no definite osseous anomaly. There was no obvious osteomyelitis noted of either site per the reports. With that being said patient's wounds in general do not appear to have shown signs of significant improvement compared to my last evaluation roughly 2 weeks ago. She again has previously seen Dr. Orland Jarred though not specifically for everything that is going on at this time. In regard to the x-rays he was difficult to position her due to her severe contraction and therefore I'm also unsure whether or not Dr. Orland Jarred would even recommend MRI I do not believe she would be able to hold still for an MRI. Nonetheless at this point the Santyl does seem to be helping with loosening up some of the eschar on the medial aspect of the foot wound. The lateral aspect still is fairly firmly attached. The area on the lateral aspect of the first toe seems to be cleaning up a bit but still has slough noted. No fevers, chills, nausea, or vomiting noted at this time. Patient History Unable to Obtain Patient History due  to Dementia. Information obtained from Patient. Social History Unknown if ever smoked, Marital Status - Widowed, Alcohol Use - Never, Drug Use - No History, Caffeine Use - Daily. Review of Systems (ROS) Constitutional Symptoms (General Health) Starzyk, [name] [name] ([id number]) Denies complaints or symptoms of Fever, Chills. Respiratory The patient has no complaints or symptoms. Cardiovascular The patient has no complaints or symptoms. Psychiatric The  patient has no complaints or symptoms. Objective Constitutional Chronically ill appearing but in no apparent acute distress. Vitals Time Taken: 2:35 PM, Height: 62 in, Weight: 140 lbs, BMI: 25.6, Temperature: 97.8 F, Pulse: 79 bpm, Respiratory Rate: 18 breaths/min, Blood Pressure: 92/71 mmHg. Respiratory normal breathing without difficulty. clear to auscultation bilaterally. Cardiovascular regular rate and rhythm with normal S1, S2. Psychiatric Patient is not able to cooperate in decision making regarding care. Patient has dementia. patient is confused and aggitated due to pain. General Notes: At this point patient still has eschar covering the lateral and medial foot wounds. There is still slough noted in the great toe wound laterally but no sharp debridement was performed at this point. Patient was slightly combative when attempting to clean the wounds due to pain and therefore I not even sure that she would be able to tolerate debridement. There is no evidence of infection significantly. Integumentary (Hair, Skin) Wound #2 status is Open. Original cause of wound was Gradually Appeared. The wound is located on the Left Toe Great. The wound measures 0.5cm length x 0.8cm width x 0.3cm depth; 0.314cm^2 area and 0.094cm^3 volume. There is Fat Layer (Subcutaneous Tissue) Exposed exposed. There is no tunneling or undermining noted. There is a medium amount of serous drainage noted. The wound margin is flat and intact. There is small (1-33%)  granulation within the wound bed. There is a large (67-100%) amount of necrotic tissue within the wound bed including Adherent Slough. The periwound skin appearance exhibited: Ecchymosis. The periwound skin appearance did not exhibit: Callus, Crepitus, Excoriation, Induration, Rash, Scarring, Dry/Scaly, Maceration, Atrophie Blanche, Cyanosis, Hemosiderin Staining, Mottled, Pallor, Rubor, Erythema. Periwound temperature was noted as No Abnormality. The periwound has tenderness on palpation. Wound #3 status is Open. Original cause of wound was Gradually Appeared. The wound is located on the Left,Lateral Foot. The wound measures 2.3cm length x 4.5cm width x 0.1cm depth; 8.129cm^2 area and 0.813cm^3 volume. The wound is limited to skin breakdown. There is no tunneling or undermining noted. There is a large amount of purulent drainage noted. The wound margin is indistinct and nonvisible. There is no granulation within the wound bed. There is a large (67-100%) amount of necrotic tissue within the wound bed. The periwound skin appearance exhibited: Ecchymosis. The periwound skin appearance did not exhibit: Callus, Crepitus, Excoriation, Induration, Rash, Scarring, Dry/Scaly, Maceration, Atrophie Blanche, Cyanosis, Hemosiderin Staining, Mottled, Pallor, Rubor, Erythema. Periwound temperature was noted as No Abnormality. The periwound has tenderness on palpation. ENA, DEMARY ([id number]) Wound #4 status is Open. Original cause of wound was Gradually Appeared. The wound is located on the Left,Medial Foot. The wound measures 3.2cm length x 6cm width x 0.3cm depth; 15.08cm^2 area and 4.524cm^3 volume. There is Fat Layer (Subcutaneous Tissue) Exposed exposed. There is no tunneling or undermining noted. There is a large amount of purulent drainage noted. The wound margin is indistinct and nonvisible. There is no granulation within the wound bed. There is a large (67-100%) amount of necrotic tissue within the  wound bed. The periwound skin appearance exhibited: Ecchymosis, Hemosiderin Staining. The periwound has tenderness on palpation. Wound #5 status is Open. Original cause of wound was Gradually Appeared. The wound is located on the Left Achilles. The wound measures 0.5cm length x 0.9cm width x 0.4cm depth; 0.353cm^2 area and 0.141cm^3 volume. There is tendon and Fat Layer (Subcutaneous Tissue) Exposed exposed. There is no tunneling or undermining noted. There is a medium amount of purulent drainage noted. The wound margin is flat and  intact. There is no granulation within the wound bed. There is no necrotic tissue within the wound bed. The periwound skin appearance exhibited: Maceration. The periwound skin appearance did not exhibit: Callus, Crepitus, Excoriation, Induration, Rash, Scarring, Dry/Scaly, Atrophie Blanche, Cyanosis, Ecchymosis, Hemosiderin Staining, Mottled, Pallor, Rubor, Erythema. Periwound temperature was noted as No Abnormality. The periwound has tenderness on palpation. 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 Cleanse wound with mild soap and water May Shower, gently pat wound dry prior to applying new dressing. Wound #3 Left,Lateral Foot: Clean wound with Normal Saline. Cleanse wound with mild soap and water May Shower, gently pat wound dry prior to applying new dressing. Wound #4 Left,Medial Foot: Clean wound with Normal Saline. Cleanse wound with mild soap and water May Shower, gently pat wound dry prior to applying new dressing. Wound #5 Left Achilles: Clean wound with Normal Saline. Cleanse wound  with mild soap and water May Shower, gently pat wound dry prior to applying new dressing. Anesthetic (add to Medication List): Wound #2 Left Toe Great: Topical Lidocaine 4% cream applied to wound bed prior to debridement (In Clinic Only). ZULEY, KAREL ([id number]) Wound #3 Left,Lateral Foot: Topical Lidocaine 4% cream applied to wound bed prior to debridement (In Clinic Only). Wound #4 Left,Medial Foot: Topical Lidocaine 4% cream applied to wound bed prior to debridement (In Clinic Only). Wound #5 Left Achilles: Topical Lidocaine 4% cream applied to wound bed prior to debridement (In Clinic Only). Primary Wound Dressing: Wound #2 Left Toe Great: Santyl Ointment Wound #3 Left,Lateral Foot: Santyl Ointment Wound #4 Left,Medial Foot: Santyl Ointment Wound #5 Left Achilles: Silvercel Non-Adherent - or silver alginate Secondary Dressing: Wound #2 Left Toe Great: ABD pad Non-adherent pad Wound #3 Left,Lateral Foot: ABD pad Non-adherent pad Wound #4 Left,Medial Foot: ABD pad Non-adherent pad Wound #5 Left Achilles: ABD pad Non-adherent pad Dressing Change Frequency: Wound #2 Left Toe Great: Three times weekly Wound #3 Left,Lateral Foot: Three times weekly Wound #4 Left,Medial Foot: Three times weekly Wound #5 Left Achilles: Three times weekly Follow-up Appointments: Wound #2 Left Toe Great: Return Appointment in 1 week. Wound #3 Left,Lateral Foot: Return Appointment in 1 week. Wound #4 Left,Medial Foot: Return Appointment in 1 week. Wound #5 Left Achilles: Return Appointment in 1 week. Home Health: Wound #2 Left Toe Great: Continue Home Health Visits - [facility] Nurse may visit PRN to address patient s wound care needs. FACE TO FACE ENCOUNTER: MEDICARE and MEDICAID PATIENTS: I certify that this patient is under my care and that I had a face-to-face encounter that meets the physician face-to-face encounter requirements with this patient on this date.  The encounter with the patient was in whole or in part for the following MEDICAL CONDITION: (primary reason for Home Healthcare) MEDICAL NECESSITY: I certify, that based on my findings, NURSING services are a medically necessary home health service. HOME BOUND STATUS: I certify that my clinical findings support that this patient is homebound (i.e., Due to illness or injury, pt requires aid of supportive devices such as crutches, cane, wheelchairs, walkers, the use of special transportation or the assistance of another person to leave their place of residence.  There is a normal inability to leave the home and doing so requires considerable and taxing effort. Other absences are for medical reasons / religious services and are infrequent or of short duration when for other reasons). If current dressing causes regression in wound condition, may D/C ordered dressing product/s and apply Normal Saline Villarin, Jassmin M. ([id number]) Moist Dressing daily until next Wound Healing Center / Other MD appointment. Notify Wound Healing Center of regression in wound condition at [phone]. Please direct any NON-WOUND related issues/requests for orders to patient's Primary Care Physician Wound #3 Left,Lateral Foot: Continue Home Health Visits - [facility] Nurse may visit PRN to address patient s wound care needs. FACE TO FACE ENCOUNTER: MEDICARE and MEDICAID PATIENTS: I certify that this patient is under my care and that I had a face-to-face encounter that meets the physician face-to-face encounter requirements with this patient on this date. The encounter with the patient was in whole or in part for the following MEDICAL CONDITION: (primary reason for Home Healthcare) MEDICAL NECESSITY: I certify, that based on my findings, NURSING services are a medically necessary home health service. HOME BOUND STATUS: I certify that my clinical findings support that this patient is homebound (i.e., Due to illness or  injury, pt requires aid of supportive devices such as crutches, cane, wheelchairs, walkers, the use of special transportation or the assistance of another person to leave their place of residence. There is a normal inability to leave the home and doing so requires considerable and taxing effort. Other absences are for medical reasons / religious services and are infrequent or of short duration when for other reasons). If current dressing causes regression in wound condition, may D/C ordered dressing product/s and apply Normal Saline Moist Dressing daily until next Wound Healing Center / Other MD appointment. Notify Wound Healing Center of regression in wound condition at [phone]. Please direct any NON-WOUND related issues/requests for orders to patient's Primary Care Physician Wound #4 Left,Medial Foot: Continue Home Health Visits - [facility] Nurse may visit PRN to address patient s wound care needs. FACE TO FACE ENCOUNTER: MEDICARE and MEDICAID PATIENTS: I certify that this patient is under my care and that I had a face-to-face encounter that meets the physician face-to-face encounter requirements with this patient on this date. The encounter with the patient was in whole or in part for the following MEDICAL CONDITION: (primary reason for Home Healthcare) MEDICAL NECESSITY: I certify, that based on my findings, NURSING services are a medically necessary home health service. HOME BOUND STATUS: I certify that my clinical findings support that this patient is homebound (i.e., Due to illness or injury, pt requires aid of supportive devices such as crutches, cane, wheelchairs, walkers, the use of special transportation or the assistance of another person to leave their place of residence. There is a normal inability to leave the home and doing so requires considerable and taxing effort. Other absences are for medical reasons / religious services and are infrequent or of short duration  when for other reasons). If current dressing causes regression in wound condition, may D/C ordered dressing product/s and apply Normal Saline Moist Dressing daily until next Wound Healing Center / Other MD appointment. Notify Wound Healing Center of regression in wound condition at [phone]. Please direct any NON-WOUND related issues/requests for orders to patient's Primary Care Physician Wound #5 Left Achilles: Continue Home Health Visits - [facility] Nurse may visit PRN to address patient s wound care needs. FACE TO FACE  ENCOUNTER: MEDICARE and MEDICAID PATIENTS: I certify that this patient is under my care and that I had a face-to-face encounter that meets the physician face-to-face encounter requirements with this patient on this date. The encounter with the patient was in whole or in part for the following MEDICAL CONDITION: (primary reason for Home Healthcare) MEDICAL NECESSITY: I certify, that based on my findings, NURSING services are a medically necessary home health service. HOME BOUND STATUS: I certify that my clinical findings support that this patient is homebound (i.e., Due to illness or injury, pt requires aid of supportive devices such as crutches, cane, wheelchairs, walkers, the use of special transportation or the assistance of another person to leave their place of residence. There is a normal inability to leave the home and doing so requires considerable and taxing effort. Other absences are for medical reasons / religious services and are infrequent or of short duration when for other reasons). If current dressing causes regression in wound condition, may D/C ordered dressing product/s and apply Normal Saline Moist Dressing daily until next Wound Healing Center / Other MD appointment. Notify Wound Healing Center of regression in wound condition at [phone]. Please direct any NON-WOUND related issues/requests for orders to patient's Primary Care  Physician Medications-please add to medication list.: Wound #2 Left Toe Great: Santyl Enzymatic Ointment Wound #3 Left,Lateral Foot: Santyl Enzymatic Ointment Wound #4 Left,Medial Foot: Santyl Enzymatic Ointment Consults ordered were: Podiatry Mcelreath, [name] [name] ([id number]) I am going to recommend that we continue with the Current wound care measures for the next week. Hopefully the eschar areas will continue to loosen out. I'm also going to suggest a follow-up appointment with Dr. Orland Jarred to see if there is anything he would recommend from a surgery standpoint. I do not feel I'm going to be able to perform any debridement in office due to patient's significant pain more than anything else. Please see above for specific wound care orders. We will see patient for re-evaluation in 1 week(s) here in the clinic. If anything worsens or changes patient will contact our office for additional recommendations. Electronic Signature(s) Signed: 05/12/2017 11:24:19 AM By: Lenda Kelp PA-C Entered By: Lenda Kelp on 05/10/2017 15:25:44 Karnik, [name] [name] ([id number]) -------------------------------------------------------------------------------- ROS/PFSH Details Patient Name: [name] [name]. Date of Service: 05/10/2017 2:45 PM Medical Record Number: [id number] Patient Account Number: [id number] Date of Birth/Sex: August 23, 1929 (84 y.o. Female) Treating RN: [name] [name] Primary Care Provider: Karlene Einstein Other Clinician: Referring Provider: Treating Provider/Extender: Linwood Dibbles, Joseth Weigel Weeks in Treatment: 1 Unable to Obtain Patient History due to oo Dementia Information Obtained From Patient Wound History Do you currently have one or more open woundso Yes How many open wounds do you currently haveo 4 Approximately how long have you had your woundso one month Has your wound(s) ever healed and then re-openedo No Have you had any lab work done in the past montho No Have you tested positive for  an antibiotic resistant organism (MRSA, VRE)o No Have you tested positive for osteomyelitis (bone infection)o No Have you had any tests for circulation on your legso No Constitutional Symptoms (General Health) Complaints and Symptoms: Negative for: Fever; Chills Eyes Medical History: Negative for: Cataracts; Glaucoma; Optic Neuritis Respiratory Complaints and Symptoms: No Complaints or Symptoms Medical History: Positive for: Chronic Obstructive Pulmonary Disease (COPD) Negative for: Aspiration; Asthma; Pneumothorax; Sleep Apnea; Tuberculosis Cardiovascular Complaints and Symptoms: No Complaints or Symptoms Medical History: Positive for: Arrhythmia - a-fib, sinus tach; Congestive Heart Failure; Coronary Artery Disease; Hypertension Negative  for: Angina; Deep Vein Thrombosis; Hypotension; Myocardial Infarction; Peripheral Arterial Disease; Peripheral Venous Disease; Phlebitis; Vasculitis Endocrine Medical History: Positive for: Type II Diabetes Negative for: Type I Diabetes Kernes, Manal M. ([id number]) Treated with: Oral agents Blood sugar tested every day: Yes Tested : Genitourinary Medical History: Negative for: End Stage Renal Disease Immunological Medical History: Negative for: Lupus Erythematosus; Raynaudos Musculoskeletal Medical History: Positive for: Rheumatoid Arthritis Negative for: Gout; Osteoarthritis; Osteomyelitis Neurologic Medical History: Positive for: Dementia Negative for: Neuropathy; Quadriplegia; Paraplegia; Seizure Disorder Oncologic Medical History: Negative for: Received Chemotherapy; Received Radiation Psychiatric Complaints and Symptoms: No Complaints or Symptoms Medical History: Negative for: Anorexia/bulimia; Confinement Anxiety Immunizations Pneumococcal Vaccine: Received Pneumococcal Vaccination: No Implantable Devices Family and Social History Unknown if ever smoked; Marital Status - Widowed; Alcohol Use: Never; Drug Use: No History;  Caffeine Use: Daily; Financial Concerns: No; Food, Clothing or Shelter Needs: No; Support System Lacking: No; Transportation Concerns: No; Advanced Directives: No; Patient does not want information on Advanced Directives; Do not resuscitate: No; Living Will: No; Medical Power of Attorney: Yes (Not Provided) Physician Affirmation I have reviewed and agree with the above information. Electronic Signature(s) Signed: 05/10/2017 4:34:32 PM By: [name] [name] Signed: 05/12/2017 11:24:19 AM By: Lenda Kelp PA-C Entered By: Lenda Kelp on 05/10/2017 15:24:27 [place], [name] [name] ([id number] Si, [name] [name] ([id number]) -------------------------------------------------------------------------------- SuperBill Details Patient Name: [name] [name]. Date of Service: 05/10/2017 Medical Record Number: [id number] Patient Account Number: [id number] Date of Birth/Sex: 1929-06-03 (84 y.o. 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 G30.1 Alzheimer's disease with late onset Z95.0 Presence of cardiac pacemaker Facility Procedures CPT4 Code: [id number] Description: [phone] - WOUND CARE VISIT-LEV 5 EST PT Modifier: Quantity: 1 Physician Procedures CPT4 Code: [id number] Description: 99214 - WC PHYS LEVEL 4 - EST PT ICD-10 Diagnosis Description E11.621 Type 2 diabetes mellitus with foot ulcer L97.523 Non-pressure chronic ulcer of other part of left foot with necr L97.323 Non-pressure chronic ulcer of left ankle with  necrosis of muscl G30.1 Alzheimer's disease with late onset Modifier: osis of muscle e Quantity: 1 Electronic  Signature(s) Signed: 05/10/2017 3:56:40 PM By: [name] [name] Signed: 05/12/2017 11:24:19 AM By: Lenda Kelp PA-C Entered By: [name] [name] on 05/10/2017 15:56:40

## 2017-05-17 ENCOUNTER — Encounter: Payer: Medicare Other | Attending: Physician Assistant | Admitting: Physician Assistant

## 2017-05-17 DIAGNOSIS — I4891 Unspecified atrial fibrillation: Secondary | ICD-10-CM | POA: Insufficient documentation

## 2017-05-17 DIAGNOSIS — I509 Heart failure, unspecified: Secondary | ICD-10-CM | POA: Insufficient documentation

## 2017-05-17 DIAGNOSIS — I429 Cardiomyopathy, unspecified: Secondary | ICD-10-CM | POA: Diagnosis not present

## 2017-05-17 DIAGNOSIS — G301 Alzheimer's disease with late onset: Secondary | ICD-10-CM | POA: Insufficient documentation

## 2017-05-17 DIAGNOSIS — I110 Hypertensive heart disease with heart failure: Secondary | ICD-10-CM | POA: Insufficient documentation

## 2017-05-17 DIAGNOSIS — Z950 Presence of cardiac pacemaker: Secondary | ICD-10-CM | POA: Diagnosis not present

## 2017-05-17 DIAGNOSIS — J449 Chronic obstructive pulmonary disease, unspecified: Secondary | ICD-10-CM | POA: Insufficient documentation

## 2017-05-17 DIAGNOSIS — E11621 Type 2 diabetes mellitus with foot ulcer: Secondary | ICD-10-CM | POA: Diagnosis not present

## 2017-05-17 DIAGNOSIS — L97523 Non-pressure chronic ulcer of other part of left foot with necrosis of muscle: Secondary | ICD-10-CM | POA: Insufficient documentation

## 2017-05-17 DIAGNOSIS — F0280 Dementia in other diseases classified elsewhere without behavioral disturbance: Secondary | ICD-10-CM | POA: Insufficient documentation

## 2017-05-17 DIAGNOSIS — Z7984 Long term (current) use of oral hypoglycemic drugs: Secondary | ICD-10-CM | POA: Insufficient documentation

## 2017-05-17 DIAGNOSIS — L97323 Non-pressure chronic ulcer of left ankle with necrosis of muscle: Secondary | ICD-10-CM | POA: Diagnosis not present

## 2017-05-17 DIAGNOSIS — M069 Rheumatoid arthritis, unspecified: Secondary | ICD-10-CM | POA: Diagnosis not present

## 2017-05-18 NOTE — Progress Notes (Signed)
[name], [name] ([id number]) Visit Report for 05/17/2017 Arrival Information Details Patient Name: [name] [name], [name] [name]. Date of Service: 05/17/2017 12:30 PM Medical Record Number: [id number] Patient Account Number: [id number] Date of Birth/Sex: 02-08-1930 (84 y.o. Female) Treating RN: Huel Coventry Primary Care Kasai Beltran: Karlene Einstein Other Clinician: Referring Adrain Butrick: Treating Matin Mattioli/Extender: Linwood Dibbles, HOYT Weeks in Treatment: 2 Visit Information History Since Last Visit Added or deleted any medications: No Patient Arrived: Wheel Chair Any new allergies or adverse reactions: No Arrival Time: 12:49 Had a fall or experienced change in No Accompanied By: caregiver activities of daily living that may affect Transfer Assistance: Michiel Sites Lift risk of falls: Patient Identification Verified: Yes Signs or symptoms of abuse/neglect since No Secondary Verification Process Completed: Yes last visito Hospitalized since last visit: No Has Dressing in Place as Prescribed: Yes Pain Present Now: Unable to [employer]) Signed: 05/17/2017 5:15:14 PM By: Elliot Gurney, BSN, RN, CWS, Kim RN, BSN Entered By: Elliot Gurney, BSN, RN, CWS, Kim on 05/17/2017 12:49:53 [name] [name] ([id number]) -------------------------------------------------------------------------------- Clinic Level of Care Assessment Details Patient Name: [name] [name]. Date of Service: 05/17/2017 12:30 PM Medical Record Number: [id number] Patient Account Number: [id number] Date of Birth/Sex: 05-31-1929 (84 y.o. Female) Treating RN: Huel Coventry Primary Care Amiri Riechers: Karlene Einstein Other Clinician: Referring Darriel Sinquefield: Treating Carnelia Oscar/Extender: Linwood Dibbles, HOYT Weeks in Treatment: 2 Clinic Level of Care Assessment Items TOOL 4 Quantity Score []  - Use when only an EandM is performed on FOLLOW-UP visit 0 ASSESSMENTS - Nursing Assessment / Reassessment []  - Reassessment of Co-morbidities (includes updates in patient status) 0 X- 1  5 Reassessment of Adherence to Treatment Plan ASSESSMENTS - Wound and Skin Assessment / Reassessment []  - Simple Wound Assessment / Reassessment - one wound 0 X- 4 5 Complex Wound Assessment / Reassessment - multiple wounds []  - 0 Dermatologic / Skin Assessment (not related to wound area) ASSESSMENTS - Focused Assessment []  - Circumferential Edema Measurements - multi extremities 0 []  - 0 Nutritional Assessment / Counseling / Intervention []  - 0 Lower Extremity Assessment (monofilament, tuning fork, pulses) []  - 0 Peripheral Arterial Disease Assessment (using hand held doppler) ASSESSMENTS - Ostomy and/or Continence Assessment and Care []  - Incontinence Assessment and Management 0 []  - 0 Ostomy Care Assessment and Management (repouching, etc.) PROCESS - Coordination of Care []  - Simple Patient / Family Education for ongoing care 0 X- 1 20 Complex (extensive) Patient / Family Education for ongoing care X- 1 10 Staff obtains , Records, Test Results / Process Orders []  - 0 Staff telephones HHA, Nursing Homes / Clarify orders / etc []  - 0 Routine Transfer to another Facility (non-emergent condition) []  - 0 Routine Hospital Admission (non-emergent condition) []  - 0 New Admissions / / Ordering NPWT, Apligraf, etc. []  - 0 Emergency Hospital Admission (emergent condition) X- 1 10 Simple Discharge Coordination [name] [name] ( ) []  - 0 Complex (extensive) Discharge Coordination PROCESS - Special Needs []  - Pediatric / Minor Patient Management 0 []  - 0 Isolation Patient Management []  - 0 Hearing / Language / Visual special needs []  - 0 Assessment of Community assistance (transportation, D/C planning, etc.) []  - 0 Additional assistance / Altered mentation []  - 0 Support Surface(s) Assessment (bed, cushion, seat, etc.) INTERVENTIONS - Wound Cleansing / Measurement []  - Simple Wound Cleansing - one wound 0 X- 4 5 Complex Wound  Cleansing - multiple wounds X- 1 5 Wound Imaging (photographs - any number of wounds) []  - 0 Wound Tracing (instead of photographs) []  -  0 Simple Wound Measurement - one wound X- 4 5 Complex Wound Measurement - multiple wounds INTERVENTIONS - Wound Dressings []  - Small Wound Dressing one or multiple wounds 0 []  - 0 Medium Wound Dressing one or multiple wounds X- 1 20 Large Wound Dressing one or multiple wounds []  - 0 Application of Medications - topical []  - 0 Application of Medications - injection INTERVENTIONS - Miscellaneous []  - External ear exam 0 []  - 0 Specimen Collection (cultures, biopsies, blood, body fluids, etc.) []  - 0 Specimen(s) / Culture(s) sent or taken to Lab for analysis []  - 0 Patient Transfer (multiple staff / Nurse, adult / Similar devices) []  - 0 Simple Staple / Suture removal (25 or less) []  - 0 Complex Staple / Suture removal (26 or more) []  - 0 Hypo / Hyperglycemic Management (close monitor of Blood Glucose) []  - 0 Ankle / Brachial Index (ABI) - do not check if billed separately X- 1 5 Vital Signs Allende, Chaunice M. ([id number]) Has the patient been seen at the hospital within the last three years: Yes Total Score: 135 Level Of Care: New/Established - Level 4 Electronic Signature(s) Signed: 05/17/2017 5:15:14 PM By: Elliot Gurney, BSN, RN, CWS, Kim RN, BSN Entered By: Elliot Gurney, BSN, RN, CWS, Kim on 05/17/2017 13:21:29 [name] [name] ([id number]) -------------------------------------------------------------------------------- Encounter Discharge Information Details Patient Name: [name] [name]. Date of Service: 05/17/2017 12:30 PM Medical Record Number: [id number] Patient Account Number: [id number] Date of Birth/Sex: 19-Feb-1930 (84 y.o. Female) Treating RN: Huel Coventry Primary Care Vannia Pola: Karlene Einstein Other Clinician: Referring Odyssey Vasbinder: Treating Muhsin Doris/Extender: Skeet Simmer in Treatment: 2 Encounter Discharge Information Items Discharge  Condition: Stable Ambulatory Status: Wheelchair Discharge Destination: Nursing Home Transportation: Private Auto Accompanied By: caregiver Schedule Follow-up Appointment: Yes Medication Reconciliation completed and Yes provided to Patient/Care Turner Baillie: Provided on Clinical Summary of Care: 05/17/2017 Form Type Recipient Paper Patient LG Electronic Signature(s) Signed: 05/17/2017 5:15:14 PM By: Elliot Gurney, BSN, RN, CWS, Kim RN, BSN Entered By: Elliot Gurney, BSN, RN, CWS, Kim on 05/17/2017 13:22:31 Roberson, Lorinda [name] ([id number]) -------------------------------------------------------------------------------- Lower Extremity Assessment Details Patient Name: [name] [name]. Date of Service: 05/17/2017 12:30 PM Medical Record Number: [id number] Patient Account Number: [id number] Date of Birth/Sex: 09-May-1930 (84 y.o. Female) Treating RN: Huel Coventry Primary Care Jossue Rubenstein: Karlene Einstein Other Clinician: Referring Linday Rhodes: Treating Mikia Delaluz/Extender: Linwood Dibbles, HOYT Weeks in Treatment: 2 Vascular Assessment Pulses: Dorsalis Pedis Palpable: [Left:No] Doppler Audible: [Left:Yes] Posterior Tibial Extremity colors, hair growth, and conditions: Extremity Color: [Left:Dusky] Hair Growth on Extremity: [Left:No] Temperature of Extremity: [Left:Warm] Capillary Refill: [Left:> 3 seconds] Toe Nail Assessment Left: Right: Thick: Yes Discolored: Yes Deformed: Yes Improper Length and Hygiene: Yes Electronic Signature(s) Signed: 05/17/2017 5:15:14 PM By: Elliot Gurney, BSN, RN, CWS, Kim RN, BSN Entered By: Elliot Gurney, BSN, RN, CWS, Kim on 05/17/2017 12:57:23 Fujimoto, Lorinda [name] ([id number]) -------------------------------------------------------------------------------- Multi Wound Chart Details Patient Name: [name] [name]. Date of Service: 05/17/2017 12:30 PM Medical Record Number: [id number] Patient Account Number: [id number] Date of Birth/Sex: [birth year] (84 y.o. Female) Treating RN: Huel Coventry Primary Care Zacherie Honeyman:  Karlene Einstein Other Clinician: Referring Morgann Woodburn: Treating Geoge Lawrance/Extender: Linwood Dibbles, HOYT Weeks in Treatment: 2 Vital Signs Height(in): 62 Pulse(bpm): 70 Weight(lbs): 140 Blood Pressure(mmHg): 109/91 Body Mass Index(BMI): 26 Temperature(F): 97.6 Respiratory Rate 16 (breaths/min): Photos: Wound Location: Left Toe Great Left, Lateral Foot Left, Medial Foot Wounding Event: Gradually Appeared Gradually Appeared Gradually Appeared Primary Etiology: Arterial Insufficiency Ulcer Arterial Insufficiency Ulcer Arterial Insufficiency Ulcer Date Acquired: 04/02/2017 02/08/2017 04/12/2017 Weeks of Treatment: 2 2 2  Wound  Status: Open Open Open Pending Amputation on Yes Yes Yes Presentation: Measurements L x W x D 1x1x0.4 2.2x8.5x0.1 6.7x4.5x0.4 (cm) Area (cm) : 0.785 14.687 23.68 Volume (cm) : 0.314 1.469 9.472 % Reduction in Area: -300.50% -10.00% 24.60% % Reduction in Volume: -432.20% -10.00% -201.50% Classification: Full Thickness With Exposed Unclassifiable Unclassifiable Support Structures Periwound Skin Texture: No Abnormalities Noted No Abnormalities Noted No Abnormalities Noted Periwound Skin Moisture: No Abnormalities Noted No Abnormalities Noted No Abnormalities Noted Periwound Skin Color: No Abnormalities Noted No Abnormalities Noted No Abnormalities Noted Tenderness on Palpation: No No No Wound Number: 5 N/A N/A Photos: N/A N/A Wound Location: Left Achilles N/A N/A Wounding Event: Gradually Appeared N/A N/A Primary Etiology: Arterial Insufficiency Ulcer N/A N/A JHERI, MITTER ([id number]) Date Acquired: 04/12/2017 N/A N/A Weeks of Treatment: 2 N/A N/A Wound Status: Open N/A N/A Pending Amputation on Yes N/A N/A Presentation: Measurements L x W x D 0.6x1x0.4 N/A N/A (cm) Area (cm) : 0.471 N/A N/A Volume (cm) : 0.188 N/A N/A % Reduction in Area: -71.30% N/A N/A % Reduction in Volume: -37.20% N/A N/A Classification: Full Thickness With Exposed N/A  N/A Support Structures Periwound Skin Texture: No Abnormalities Noted N/A N/A Periwound Skin Moisture: No Abnormalities Noted N/A N/A Periwound Skin Color: No Abnormalities Noted N/A N/A Tenderness on Palpation: No N/A N/A Treatment Notes Electronic Signature(s) Signed: 05/17/2017 5:15:14 PM By: Elliot Gurney, BSN, RN, CWS, Kim RN, BSN Entered By: Elliot Gurney, BSN, RN, CWS, Kim on 05/17/2017 13:20:44 Kight, Lorinda [name] ([id number]) -------------------------------------------------------------------------------- Multi-Disciplinary Care Plan Details Patient Name: [name] [name]. Date of Service: 05/17/2017 12:30 PM Medical Record Number: [id number] Patient Account Number: [id number] Date of Birth/Sex: 1930-01-24 (84 y.o. Female) Treating RN: Huel Coventry Primary Care Lyana Asbill: Karlene Einstein Other Clinician: Referring Varun Jourdan: Treating Taraoluwa Thakur/Extender: Linwood Dibbles, HOYT Weeks in Treatment: 2 Active Inactive ` Orientation to the Wound Care Program Nursing Diagnoses: Knowledge deficit related to the wound healing center program Goals: Patient/caregiver will verbalize understanding of the Wound Healing Center Program Date Initiated: 04/30/2017 Target Resolution Date: 05/31/2017 Goal Status: Active Interventions: Provide education on orientation to the wound center Notes: ` Tissue Oxygenation Nursing Diagnoses: Actual ineffective tissue perfusion; peripheral (select once diagnosis is confirmed) Goals: Patient/caregiver will verbalize understanding of disease process and disease management Date Initiated: 05/17/2017 Target Resolution Date: 06/07/2017 Goal Status: Active Interventions: Assess peripheral arterial status upon admission and as needed Treatment Activities: Test ordered outside of clinic : 05/17/2017 Notes: ` Wound/Skin Impairment Nursing Diagnoses: Impaired tissue integrity Knowledge deficit related to ulceration/compromised skin integrity Goals: Patient/caregiver will verbalize  understanding of skin care regimen IKRAN, PATMAN ([id number]) Date Initiated: 04/30/2017 Target Resolution Date: 05/31/2017 Goal Status: Active Ulcer/skin breakdown will have a volume reduction of 30% by week 4 Date Initiated: 04/30/2017 Target Resolution Date: 05/31/2017 Goal Status: Active Interventions: Assess patient/caregiver ability to obtain necessary supplies Assess patient/caregiver ability to perform ulcer/skin care regimen upon admission and as needed Assess ulceration(s) every visit Provide education on ulcer and skin care Treatment Activities: Skin care regimen initiated : 04/30/2017 Notes: Electronic Signature(s) Signed: 05/17/2017 5:15:14 PM By: Elliot Gurney, BSN, RN, CWS, Kim RN, BSN Entered By: Elliot Gurney, BSN, RN, CWS, Kim on 05/17/2017 12:58:06 Plaugher, Lorinda [name] ([id number]) -------------------------------------------------------------------------------- Pain Assessment Details Patient Name: [name] [name]. Date of Service: 05/17/2017 12:30 PM Medical Record Number: [id number] Patient Account Number: [id number] Date of Birth/Sex: June 16, 1929 (84 y.o. Female) Treating RN: Huel Coventry Primary Care Chrisha Vogel: Karlene Einstein Other Clinician: Referring Belenda Alviar: Treating Elwanda Moger/Extender: Linwood Dibbles, HOYT Weeks in Treatment: 2  Active Problems Location of Pain Severity and Description of Pain Patient Has Paino Patient Unable to Respond Site Locations Pain Management and Medication Current Pain Management: Goals for Pain Management Topical or injectable lidocaine is offered to patient for acute pain when surgical debridement is performed. If needed, Patient is instructed to use over the counter pain medication for the following 24-48 hours after debridement. Wound care MDs do not prescribed pain medications. Patient has chronic pain or uncontrolled pain. Patient has been instructed to make an appointment with their Primary Care Physician for pain management. Electronic  Signature(s) Signed: 05/17/2017 5:15:14 PM By: Elliot Gurney, BSN, RN, CWS, Kim RN, BSN Entered By: Elliot Gurney, BSN, RN, CWS, Kim on 05/17/2017 12:50:05 [name] [name] ([id number]) -------------------------------------------------------------------------------- Patient/Caregiver Education Details Patient Name: [name] [name]. Date of Service: 05/17/2017 12:30 PM Medical Record Number: [id number] Patient Account Number: [id number] Date of Birth/Gender: [birth year] (84 y.o. Female) Treating RN: Huel Coventry Primary Care Physician: Karlene Einstein Other Clinician: Referring Physician: Treating Physician/Extender: Skeet Simmer in Treatment: 2 Education Assessment Education Provided To: Patient Education Topics Provided Wound/Skin Impairment: Handouts: Other: wound care as prescribed Methods: Demonstration, Explain/Verbal Responses: State content correctly Electronic Signature(s) Signed: 05/17/2017 5:15:14 PM By: Elliot Gurney, BSN, RN, CWS, Kim RN, BSN Entered By: Elliot Gurney, BSN, RN, CWS, Kim on 05/17/2017 13:22:48 [name] [name] ([id number]) -------------------------------------------------------------------------------- Wound Assessment Details Patient Name: [name] [name]. Date of Service: 05/17/2017 12:30 PM Medical Record Number: [id number] Patient Account Number: [id number] Date of Birth/Sex: [birth year] (84 y.o. Female) Treating RN: Huel Coventry Primary Care Averi Kilty: Karlene Einstein Other Clinician: Referring Sadhana Frater: Treating Tamantha Saline/Extender: Linwood Dibbles, HOYT Weeks in Treatment: 2 Wound Status Wound Number: 2 Primary Etiology: Arterial Insufficiency Ulcer Wound Location: Left Toe Great Wound Status: Open Wounding Event: Gradually Appeared Date Acquired: 04/02/2017 Weeks Of Treatment: 2 Clustered Wound: No Pending Amputation On Presentation Photos Photo Uploaded By: Elliot Gurney, BSN, RN, CWS, Kim on 05/17/2017 13:16:26 Wound Measurements Length: (cm) 1 Width: (cm) 1 Depth: (cm) 0.4 Area:  (cm) 0.785 Volume: (cm) 0.314 % Reduction in Area: -300.5% % Reduction in Volume: -432.2% Wound Description Full Thickness With Exposed Support Classification: Structures Periwound Skin Texture Texture Color No Abnormalities Noted: No No Abnormalities Noted: No Moisture No Abnormalities Noted: No Treatment Notes Wound #2 (Left Toe Great) 1. Cleansed with: Clean wound with Normal Saline 2. Anesthetic Topical Lidocaine 4% cream to wound bed prior to debridement 4. Dressing Applied: [address] TANYELL, BRISCOE. ([id number]) 5. Secondary Dressing Applied ABD Pad Kerlix/Conform Non-Adherent pad 7. Secured with Secretary/administrator) Signed: 05/17/2017 5:15:14 PM By: Elliot Gurney, BSN, RN, CWS, Kim RN, BSN Entered By: Elliot Gurney, BSN, RN, CWS, Kim on 05/17/2017 12:55:12 [name] [name] ([id number]) -------------------------------------------------------------------------------- Wound Assessment Details Patient Name: [name] [name]. Date of Service: 05/17/2017 12:30 PM Medical Record Number: [id number] Patient Account Number: [id number] Date of Birth/Sex: 1929-06-11 (84 y.o. Female) Treating RN: Huel Coventry Primary Care Ashan Cueva: Karlene Einstein Other Clinician: Referring Sharese Manrique: Treating Bhavika Schnider/Extender: Linwood Dibbles, HOYT Weeks in Treatment: 2 Wound Status Wound Number: 3 Primary Etiology: Arterial Insufficiency Ulcer Wound Location: Left, Lateral Foot Wound Status: Open Wounding Event: Gradually Appeared Date Acquired: 02/08/2017 Weeks Of Treatment: 2 Clustered Wound: No Pending Amputation On Presentation Photos Photo Uploaded By: Elliot Gurney, BSN, RN, CWS, Kim on 05/17/2017 13:16:26 Wound Measurements Length: (cm) 2.2 Width: (cm) 8.5 Depth: (cm) 0.1 Area: (cm) 14.687 Volume: (cm) 1.469 % Reduction in Area: -10% % Reduction in Volume: -10% Wound Description Classification: Unclassifiable Periwound Skin Texture Texture Color No Abnormalities Noted:  No No  Abnormalities Noted: No Moisture No Abnormalities Noted: No Treatment Notes Wound #3 (Left, Lateral Foot) 1. Cleansed with: Clean wound with Normal Saline 2. Anesthetic Topical Lidocaine 4% cream to wound bed prior to debridement 4. Dressing Applied: Santyl Ointment 5. Secondary Dressing Applied [place], Lorinda [name] ([id number]) ABD Pad Kerlix/Conform Non-Adherent pad 7. Secured with Secretary/administrator) Signed: 05/17/2017 5:15:14 PM By: Elliot Gurney, BSN, RN, CWS, Kim RN, BSN Entered By: Elliot Gurney, BSN, RN, CWS, Kim on 05/17/2017 12:55:12 [name] [name] ([id number]) -------------------------------------------------------------------------------- Wound Assessment Details Patient Name: [name] [name]. Date of Service: 05/17/2017 12:30 PM Medical Record Number: [id number] Patient Account Number: [id number] Date of Birth/Sex: 1930-03-28 (84 y.o. Female) Treating RN: Huel Coventry Primary Care Lyvia Mondesir: Karlene Einstein Other Clinician: Referring Nivia Gervase: Treating Papa Piercefield/Extender: Linwood Dibbles, HOYT Weeks in Treatment: 2 Wound Status Wound Number: 4 Primary Etiology: Arterial Insufficiency Ulcer Wound Location: Left, Medial Foot Wound Status: Open Wounding Event: Gradually Appeared Date Acquired: 04/12/2017 Weeks Of Treatment: 2 Clustered Wound: No Pending Amputation On Presentation Photos Photo Uploaded By: Elliot Gurney, BSN, RN, CWS, Kim on 05/17/2017 13:16:49 Wound Measurements Length: (cm) 6.7 Width: (cm) 4.5 Depth: (cm) 0.4 Area: (cm) 23.68 Volume: (cm) 9.472 % Reduction in Area: 24.6% % Reduction in Volume: -201.5% Wound Description Classification: Unclassifiable Periwound Skin Texture Texture Color No Abnormalities Noted: No No Abnormalities Noted: No Moisture No Abnormalities Noted: No Treatment Notes Wound #4 (Left, Medial Foot) 1. Cleansed with: Clean wound with Normal Saline 2. Anesthetic Topical Lidocaine 4% cream to wound bed prior to debridement 4. Dressing  Applied: Santyl Ointment 5. Secondary Dressing Applied Sickman, Lorinda [name] ([id number]) ABD Pad Kerlix/Conform Non-Adherent pad 7. Secured with Secretary/administrator) Signed: 05/17/2017 5:15:14 PM By: Elliot Gurney, BSN, RN, CWS, Kim RN, BSN Entered By: Elliot Gurney, BSN, RN, CWS, Kim on 05/17/2017 12:55:12 [name] [name] ([id number]) -------------------------------------------------------------------------------- Wound Assessment Details Patient Name: [name] [name]. Date of Service: 05/17/2017 12:30 PM Medical Record Number: [id number] Patient Account Number: [id number] Date of Birth/Sex: June 14, 1929 (84 y.o. Female) Treating RN: Huel Coventry Primary Care Troy Kanouse: Karlene Einstein Other Clinician: Referring Maansi Wike: Treating Kaj Vasil/Extender: Linwood Dibbles, HOYT Weeks in Treatment: 2 Wound Status Wound Number: 5 Primary Etiology: Arterial Insufficiency Ulcer Wound Location: Left Achilles Wound Status: Open Wounding Event: Gradually Appeared Date Acquired: 04/12/2017 Weeks Of Treatment: 2 Clustered Wound: No Pending Amputation On Presentation Photos Photo Uploaded By: Elliot Gurney, BSN, RN, CWS, Kim on 05/17/2017 13:16:49 Wound Measurements Length: (cm) 0.6 Width: (cm) 1 Depth: (cm) 0.4 Area: (cm) 0.471 Volume: (cm) 0.188 % Reduction in Area: -71.3% % Reduction in Volume: -37.2% Wound Description Full Thickness With Exposed Support Classification: Structures Periwound Skin Texture Texture Color No Abnormalities Noted: No No Abnormalities Noted: No Moisture No Abnormalities Noted: No Treatment Notes Wound #5 (Left Achilles) 1. Cleansed with: Clean wound with Normal Saline 2. Anesthetic Topical Lidocaine 4% cream to wound bed prior to debridement 4. Dressing Applied: Other dressing (specify in notes) Panetta, Johanne M. ([id number]) 5. Secondary Dressing Applied Dry Gauze Kerlix/Conform Notes silvercell, netting Electronic Signature(s) Signed: 05/17/2017 5:15:14 PM By: Elliot Gurney, BSN, RN,  CWS, Kim RN, BSN Entered By: Elliot Gurney, BSN, RN, CWS, Kim on 05/17/2017 12:55:12 [name] [name] ([id number]) -------------------------------------------------------------------------------- Vitals Details Patient Name: [name] [name]. Date of Service: 05/17/2017 12:30 PM Medical Record Number: [id number] Patient Account Number: [id number] Date of Birth/Sex: 08-06-1929 (84 y.o. Female) Treating RN: Huel Coventry Primary Care Aengus Sauceda: Karlene Einstein Other Clinician: Referring Xiong Haidar: Treating Anabelen Kaminsky/Extender: Linwood Dibbles, HOYT Weeks in Treatment: 2 Vital Signs Time Taken:  12:50 Temperature (F): 97.6 Height (in): 62 Pulse (bpm): 70 Weight (lbs): 140 Respiratory Rate (breaths/min): 16 Body Mass Index (BMI): 25.6 Blood Pressure (mmHg): 109/91 Reference Range: 80 - 120 mg / dl Notes Patient is contracted, moving around while BP taken. Measured BP is not reliable Electronic Signature(s) Signed: 05/17/2017 5:15:14 PM By: Elliot Gurney, BSN, RN, CWS, Kim RN, BSN Entered By: Elliot Gurney, BSN, RN, CWS, Kim on 05/17/2017 12:51:16

## 2017-05-18 NOTE — Progress Notes (Signed)
[name] [name] ([id number]) Visit Report for 05/17/2017 Chief Complaint Document Details Patient Name: [name] [name], [name] [name]. Date of Service: 05/17/2017 12:30 PM Medical Record Number: [id number] Patient Account Number: [id number] Date of Birth/Sex: 1929/10/22 (84 y.o. Female) Treating RN: Huel Coventry Primary Care Provider: Karlene Einstein Other Clinician: Referring Provider: Treating Provider/Extender: Linwood Dibbles, HOYT Weeks in Treatment: 2 Information Obtained from: Patient Chief Complaint Left foot ulcers Electronic Signature(s) Signed: 05/17/2017 5:30:29 PM By: Lenda Kelp PA-C Entered By: Lenda Kelp on 05/17/2017 12:58:43 Regal, [name] [name] ([id number]) -------------------------------------------------------------------------------- HPI Details Patient Name: [name] [name]. Date of Service: 05/17/2017 12:30 PM Medical Record Number: [id number] Patient Account Number: [id number] Date of Birth/Sex: July 17, 1929 (84 y.o. Female) Treating RN: Huel Coventry Primary Care Provider: Karlene Einstein Other Clinician: Referring Provider: Treating Provider/Extender: Linwood Dibbles, HOYT Weeks in Treatment: 2 History of Present Illness HPI Description: 84 year old patient who has significant dementia had been seen recently in the ED for an infected toe and this was the left second toe on the plantar surface.past medical history significant for atrial fibrillation, Alzheimer's disease, carotid artery disease, cardiomyopathy, CHF, dementia, diabetes mellitus, hypertension, status post hysterectomy and pacemaker placement. x-ray done in the ER showed diffuse osteopenia with no definite evidence of osteomyelitis. the patient had strong dorsalis pedis pulse on Doppler, she was placed on Keflex and Bactroban ointment and was asked to see the wound center Readmission: 04/30/17 on evaluation today patient appears to be doing poorly in regard to her left foot. She has several ulcers noted on evaluation today which  are very degrees of depth although the posterior Achilles ulcer does show evidence of tendon involvement at this point. She does not appear to have any evidence of infection. No fevers, chills, nausea, or vomiting noted at this time. She has been seeing her podiatrist Dr. Orland Jarred who did perform the amputation on her left second toe which was back in October 2018 following her first evaluation and our clinic. The state is patient's legal guardian at this point. 05/10/17 on evaluation today I did have patientos left foot and left ankle x-rays for review. Left foot x-ray revealed osteopenia with sub optimal evaluation of the distal digits due to positioning no definite osseous abnormality. Left ankle x-ray revealed diffuse osteopenia with no definite osseous anomaly. There was no obvious osteomyelitis noted of either site per the reports. With that being said patient's wounds in general do not appear to have shown signs of significant improvement compared to my last evaluation roughly 2 weeks ago. She again has previously seen Dr. Orland Jarred though not specifically for everything that is going on at this time. In regard to the x-rays he was difficult to position her due to her severe contraction and therefore I'm also unsure whether or not Dr. Orland Jarred would even recommend MRI I do not believe she would be able to hold still for an MRI. Nonetheless at this point the Santyl does seem to be helping with loosening up some of the eschar on the medial aspect of the foot wound. The lateral aspect still is fairly firmly attached. The area on the lateral aspect of the first toe seems to be cleaning up a bit but still has slough noted. No fevers, chills, nausea, or vomiting noted at this time. 05/17/17 on evaluation today patient appears to be doing a little bit better concerning her wounds. They really are not measuring any smaller but the overall appearance does seem to be somewhat better. She has not had the  appointment  with the podiatrist as of yet although she will be seen Dr. Graciela Husbands soon. She continues to have discomfort at all wound sites unfortunately. She also is severely contracted. Electronic Signature(s) Signed: 05/17/2017 5:30:29 PM By: Lenda Kelp PA-C Entered By: Lenda Kelp on 05/17/2017 13:32:00 Spieth, [name] [name] ([id number]) -------------------------------------------------------------------------------- Physical Exam Details Patient Name: [name] [name]. Date of Service: 05/17/2017 12:30 PM Medical Record Number: [id number] Patient Account Number: [id number] Date of Birth/Sex: 11-05-1929 (84 y.o. Female) Treating RN: Huel Coventry Primary Care Provider: Karlene Einstein Other Clinician: Referring Provider: Treating Provider/Extender: Linwood Dibbles, HOYT Weeks in Treatment: 2 Constitutional Chronically ill appearing but in no apparent acute distress. Respiratory normal breathing without difficulty. clear to auscultation bilaterally. Cardiovascular regular rate and rhythm with normal S1, S2. Psychiatric Patient is not able to cooperate in decision making regarding care. Patient has dementia. patient is agitated. Notes Patient's wounds appeared to be slough and eschar covered for the most part although some of the eschar over the medial portion of her left foot does seem to be loosening in fact came off today during cleansing. The Achilles tendon is still present in the posterior wound but fortunately this does not appear to be drying out and does not appear to be any worse. No evidence of infection such as purulent discharge noted at this point. Electronic Signature(s) Signed: 05/17/2017 5:30:29 PM By: Lenda Kelp PA-C Entered By: Lenda Kelp on 05/17/2017 13:33:24 Asher, [name] [name] ([id number]) -------------------------------------------------------------------------------- Physician Orders Details Patient Name: [name] [name]. Date of Service: 05/17/2017 12:30 PM Medical  Record Number: [id number] Patient Account Number: [id number] Date of Birth/Sex: 02-17-1930 (84 y.o. 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 o Cleanse wound with mild soap and water o May Shower, gently pat wound dry prior to applying new dressing. Wound #3 Left,Lateral Foot o Clean wound with Normal Saline. o Cleanse wound with mild soap and water o May Shower, gently pat wound dry prior to applying new dressing. Wound #4 Left,Medial Foot o Clean wound with Normal Saline. o Cleanse wound with mild soap and water o May Shower, gently pat wound dry prior to applying new dressing. Wound #5 Left Achilles o Clean wound with Normal Saline. o Cleanse wound with mild soap and water o May Shower, gently pat wound dry prior to applying new dressing. Primary Wound Dressing Wound #2 Left Toe Great o Santyl Ointment Wound #3 Left,Lateral Foot o Santyl Ointment Wound #4 Left,Medial Foot o Santyl Ointment Wound #5 Left Achilles o Silvercel Non-Adherent Secondary Dressing [name] [name], [name] [name] ([id number]) Wound #2 Left Toe Great o ABD pad o Conform/Kerlix Wound #3 Left,Lateral Foot o ABD pad o Conform/Kerlix Wound #4 Left,Medial Foot o ABD pad o Conform/Kerlix Wound #5 Left Achilles o ABD pad o Conform/Kerlix Dressing Change Frequency Wound #2 Left Toe Great o Three times  weekly Wound #3 Left,Lateral Foot o Three times weekly Wound #4 Left,Medial Foot o Three times weekly Wound #5 Left Achilles o Three times weekly Follow-up Appointments Wound #2 Left  Toe Great o Return Appointment in 1 week. Wound #3 Left,Lateral Foot o Return Appointment in 1 week. Wound #4 Left,Medial Foot o Return Appointment in 1 week. Wound #5 Left Achilles o Return Appointment in 1 week. Home Health Wound #2 Left Toe Great o [facility] Vis[facility] - [facility] o Home Health Nurse may visit PRN to address patientos wound care needs. o FACE TO FACE ENCOUNTER: MEDICARE and MEDICAID PATIENTS: I certify that this patient is under my care and that I had a face-to-face encounter that meets the physician face-to-face encounter requirements with this patient on this date. The encounter with the patient was in whole or in part for the following MEDICAL CONDITION: (primary reason for Home Healthcare) MEDICAL NECESSITY: I certify, that based on my findings, NURSING services are a medically necessary home health service. HOME BOUND STATUS: I certify that my clinical findings support that this patient is homebound (i.e., Due to illness or injury, pt requires aid of supportive devices such as crutches, cane, wheelchairs, walkers, the use of special transportation or the assistance of another person to leave their place of residence. There is a normal inability to leave the home and doing so requires considerable and taxing effort. Other absences are for medical reasons / religious services and are infrequent or of short duration when for other reasons). [name] [name], [name] [name] ([id number]) o If current dressing causes regression in wound condition, may D/C ordered dressing product/s and apply Normal Saline Moist Dressing daily until next Wound Healing Center / Other MD appointment. Notify Wound Healing Center of regression in wound condition at [phone]. o Please direct  any NON-WOUND related issues/requests for orders to patient's Primary Care Physician Wound #3 Left,Lateral Foot o Continue [facility] Visits - [facility] o Home Health Nurse may visit PRN to address patientos wound care needs. o FACE TO FACE ENCOUNTER: MEDICARE and MEDICAID PATIENTS: I certify that this patient is under my care and that I had a face-to-face encounter that meets the physician face-to-face encounter requirements with this patient on this date. The encounter with the patient was in whole or in part for the following MEDICAL CONDITION: (primary reason for Home Healthcare) MEDICAL NECESSITY: I certify, that based on my findings, NURSING services are a medically necessary home health service. HOME BOUND STATUS: I certify that my clinical findings support that this patient is homebound (i.e., Due to illness or injury, pt requires aid of supportive devices such as crutches, cane, wheelchairs, walkers, the use of special transportation or the assistance of another person to leave their place of residence. There is a normal inability to leave the home and doing so requires considerable and taxing effort. Other absences are for medical reasons / religious services and are infrequent or of short duration when for other reasons). o If current dressing causes regression in wound condition, may D/C ordered dressing product/s and apply Normal Saline Moist Dressing daily until next Wound Healing Center / Other MD appointment. Notify Wound Healing Center of regression in wound condition at [phone]. o Please direct any NON-WOUND related issues/requests for orders to patient's Primary Care Physician Wound #4 Left,Medial Foot o Continue [facility] Vis[facility] - [facility] o Home Health Nurse may visit PRN to address patientos wound care needs. o FACE TO FACE ENCOUNTER: MEDICARE and MEDICAID PATIENTS: I certify that this patient is under my care and that I had a face-to-face encounter  that meets the physician face-to-face encounter requirements with this patient on this date. The encounter with the patient  was in whole or in part for the following MEDICAL CONDITION: (primary reason for Home Healthcare) MEDICAL NECESSITY: I certify, that based on my findings, NURSING services are a medically necessary home health service. HOME BOUND STATUS: I certify that my clinical findings support that this patient is homebound (i.e., Due to illness or injury, pt requires aid of supportive devices such as crutches, cane, wheelchairs, walkers, the use of special transportation or the assistance of another person to leave their place of residence. There is a normal inability to leave the home and doing so requires considerable and taxing effort. Other absences are for medical reasons / religious services and are infrequent or of short duration when for other reasons). o If current dressing causes regression in wound condition, may D/C ordered dressing product/s and apply Normal Saline Moist Dressing daily until next Wound Healing Center / Other MD appointment. Notify Wound Healing Center of regression in wound condition at [phone]. o Please direct any NON-WOUND related issues/requests for orders to patient's Primary Care Physician Wound #5 Left Achilles o Continue [facility] Visits - [facility] o Home Health Nurse may visit PRN to address patientos wound care needs. o FACE TO FACE ENCOUNTER: MEDICARE and MEDICAID PATIENTS: I certify that this patient is under my care and that I had a face-to-face encounter that meets the physician face-to-face encounter requirements with this patient on this date. The encounter with the patient was in whole or in part for the following MEDICAL CONDITION: (primary reason for Home Healthcare) MEDICAL NECESSITY: I certify, that based on my findings, NURSING services are a medically necessary home health service. HOME BOUND STATUS: I certify that  my clinical findings support that this patient is homebound (i.e., Due to illness or injury, pt requires aid of supportive devices such as crutches, cane, wheelchairs, walkers, the use of special transportation or the assistance of another person to leave their place of residence. There is a normal inability to leave the home and doing so requires considerable and taxing effort. Other absences are for medical reasons / religious services and are infrequent or of short duration when for other reasons). o If current dressing causes regression in wound condition, may D/C ordered dressing product/s and apply Normal Saline Moist Dressing daily until next Wound Healing Center / Other MD appointment. Notify Wound Healing Center of regression in wound condition at [phone]. o Please direct any NON-WOUND related issues/requests for orders to patient's Primary Care Physician [name] [name], MOSELY ([id number]) Medications-please add to medication list. Wound #2 Left Toe Great o Santyl Enzymatic Ointment Wound #3 Left,Lateral Foot o Santyl Enzymatic Ointment Wound #4 Left,Medial Foot o Santyl Enzymatic Ointment Electronic Signature(s) Signed: 05/17/2017 5:15:14 PM By: Elliot Gurney, BSN, RN, CWS, Kim RN, BSN Signed: 05/17/2017 5:30:29 PM By: Lenda Kelp PA-C Entered By: Elliot Gurney, BSN, RN, CWS, Kim on 05/17/2017 13:06:52 [name] [name], [name] [name] ([id number]) -------------------------------------------------------------------------------- Problem List Details Patient Name: [name] [name]. Date of Service: 05/17/2017 12:30 PM Medical Record Number: [id number] Patient Account Number: [id number] Date of Birth/Sex: November 26, 1929 (84 y.o. 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 Date of Service: 05/17/2017 12:30 PM Medical Record Number: [id number] Patient Account Number: [id number] Date of Birth/Sex: 1930-04-04 (84 y.o. Female) Treating RN: Huel Coventry Primary Care Provider: Karlene Einstein Other Clinician: Referring Provider: Treating Provider/Extender: Linwood Dibbles, HOYT Weeks in Treatment: 2 Subjective Chief Complaint Information obtained from Patient Left foot ulcers History of Present Illness (HPI) 84 year old patient who has significant dementia had been seen recently in the ED for an infected toe and this was the left second toe on the plantar surface.past medical history significant for atrial fibrillation, Alzheimer's disease, carotid artery disease, cardiomyopathy, CHF, dementia, diabetes mellitus, hypertension, status post hysterectomy and pacemaker placement. x-ray done in the ER showed diffuse osteopenia with no definite evidence of osteomyelitis. the patient had strong dorsalis pedis pulse on Doppler, she was placed on Keflex and Bactroban ointment and was asked to see the wound center Readmission: 04/30/17 on evaluation today patient appears to be doing poorly in regard to her left foot. She has several ulcers noted on evaluation today which are very degrees of depth although the posterior Achilles ulcer does  show evidence of tendon involvement at this point. She does not appear to have any evidence of infection. No fevers, chills, nausea, or vomiting noted at this time. She has been seeing her podiatrist Dr. Orland Jarred who did perform the amputation on her left second toe which was back in October 2018 following her first evaluation and our clinic. The state is patient's legal guardian at this point. 05/10/17 on evaluation today I did have patient s left foot and left ankle x-rays for review. Left foot x-ray revealed osteopenia with sub optimal evaluation of the distal digits due to positioning no definite osseous abnormality. Left ankle x-ray revealed diffuse osteopenia with no definite osseous anomaly. There was no obvious osteomyelitis noted of either site per the reports. With that being said patient's wounds in general do not appear to have shown signs of significant improvement compared to my last evaluation roughly 2 weeks ago. She again has previously seen Dr. Orland Jarred though not specifically for everything that is going on at this time. In regard to the x-rays he was difficult to position her due to her severe contraction and therefore I'm also unsure whether or not Dr. Orland Jarred would even recommend MRI I do not believe she would be able to hold still for an MRI. Nonetheless at this point the Santyl does seem to be helping with loosening up some of the eschar on the medial aspect of the foot wound. The lateral aspect still is fairly firmly attached. The area on the lateral aspect of the first toe seems to be cleaning up a bit but still has slough noted. No fevers, chills, nausea, or vomiting noted at this time. 05/17/17 on evaluation today patient appears to be doing a little bit better concerning her wounds. They really are not measuring any smaller but the overall appearance does seem to be somewhat better. She has not had the appointment with the podiatrist as of yet although she will be seen Dr.  Graciela Husbands soon. She continues to have discomfort at all wound sites unfortunately. She also is severely contracted. Patient History Unable to Obtain Patient History due to Dementia. Information obtained from Patient. [name] [name], [name] [name] ([id number])  Social History Unknown if ever smoked, Marital Status - Widowed, Alcohol Use - Never, Drug Use - No History, Caffeine Use - Daily. Review of Systems (ROS) Constitutional Symptoms (General Health) The patient has no complaints or symptoms. Respiratory The patient has no complaints or symptoms. Cardiovascular The patient has no complaints or symptoms. Psychiatric The patient has no complaints or symptoms. Objective Constitutional Chronically ill appearing but in no apparent acute distress. Vitals Time Taken: 12:50 PM, Height: 62 in, Weight: 140 lbs, BMI: 25.6, Temperature: 97.6 F, Pulse: 70 bpm, Respiratory Rate: 16 breaths/min, Blood Pressure: 109/91 mmHg. General Notes: Patient is contracted, moving around while BP taken. Measured BP is not reliable Respiratory normal breathing without difficulty. clear to auscultation bilaterally. Cardiovascular regular rate and rhythm with normal S1, S2. Psychiatric Patient is not able to cooperate in decision making regarding care. Patient has dementia. patient is agitated. General Notes: Patient's wounds appeared to be slough and eschar covered for the most part although some of the eschar over the medial portion of her left foot does seem to be loosening in fact came off today during cleansing. The Achilles tendon is still present in the posterior wound but fortunately this does not appear to be drying out and does not appear to be any worse. No evidence of infection such as purulent discharge noted at this point. Integumentary (Hair, Skin) Wound #2 status is Open. Original cause of wound was Gradually Appeared. The wound is located on the Left Toe Great. The wound measures 1cm length x 1cm width x 0.4cm  depth; 0.785cm^2 area and 0.314cm^3 volume. Wound #3 status is Open. Original cause of wound was Gradually Appeared. The wound is located on the Left,Lateral Foot. The wound measures 2.2cm length x 8.5cm width x 0.1cm depth; 14.687cm^2 area and 1.469cm^3 volume. Wound #4 status is Open. Original cause of wound was Gradually Appeared. The wound is located on the Left,Medial Foot. The wound measures 6.7cm length x 4.5cm width x 0.4cm depth; 23.68cm^2 area and 9.472cm^3 volume. Wound #5 status is Open. Original cause of wound was Gradually Appeared. The wound is located on the Left Achilles. The wound measures 0.6cm length x 1cm width x 0.4cm depth; 0.471cm^2 area and 0.188cm^3 volume. 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 Cleanse wound with mild soap and water May Shower, gently pat wound dry prior to applying new dressing. Wound #3 Left,Lateral Foot: Clean wound with Normal Saline. Cleanse wound with mild soap and water May Shower, gently pat wound dry prior to applying new dressing. Wound #4 Left,Medial Foot: Clean wound with Normal Saline. Cleanse wound with mild soap and water May Shower, gently pat wound dry prior to applying new dressing. Wound #5 Left Achilles: Clean wound with Normal Saline. Cleanse wound with mild soap and water May Shower, gently pat wound dry prior to applying new dressing. Primary Wound Dressing: Wound #2 Left Toe Great: Santyl Ointment Wound #3 Left,Lateral Foot: Santyl Ointment Wound #4 Left,Medial Foot: Santyl Ointment Wound #5 Left Achilles: Silvercel  Non-Adherent Secondary Dressing: Wound #2 Left Toe Great: ABD pad Conform/Kerlix Wound #3 Left,Lateral Foot: ABD pad Conform/Kerlix Wound #4 Left,Medial Foot: ABD  pad Conform/Kerlix Wound #5 Left Achilles: ABD pad [name] [name], [name] M. ([id number]) Conform/Kerlix Dressing Change Frequency: Wound #2 Left Toe Great: Three times weekly Wound #3 Left,Lateral Foot: Three times weekly Wound #4 Left,Medial Foot: Three times weekly Wound #5 Left Achilles: Three times weekly Follow-up Appointments: Wound #2 Left Toe Great: Return Appointment in 1 week. Wound #3 Left,Lateral Foot: Return Appointment in 1 week. Wound #4 Left,Medial Foot: Return Appointment in 1 week. Wound #5 Left Achilles: Return Appointment in 1 week. Home Health: Wound #2 Left Toe Great: Continue Home Health Visits - [facility] Nurse may visit PRN to address patient s wound care needs. FACE TO FACE ENCOUNTER: MEDICARE and MEDICAID PATIENTS: I certify that this patient is under my care and that I had a face-to-face encounter that meets the physician face-to-face encounter requirements with this patient on this date. The encounter with the patient was in whole or in part for the following MEDICAL CONDITION: (primary reason for Home Healthcare) MEDICAL NECESSITY: I certify, that based on my findings, NURSING services are a medically necessary home health service. HOME BOUND STATUS: I certify that my clinical findings support that this patient is homebound (i.e., Due to illness or injury, pt requires aid of supportive devices such as crutches, cane, wheelchairs, walkers, the use of special transportation or the assistance of another person to leave their place of residence. There is a normal inability to leave the home and doing so requires considerable and taxing effort. Other absences are for medical reasons / religious services and are infrequent or of short duration when for other reasons). If current dressing  causes regression in wound condition, may D/C ordered dressing product/s and apply Normal Saline Moist Dressing daily until next Wound Healing Center / Other MD appointment. Notify Wound Healing Center of regression in wound condition at [phone]. Please direct any NON-WOUND related issues/requests for orders to patient's Primary Care Physician Wound #3 Left,Lateral Foot: Continue Home Health Visits - [facility] Nurse may visit PRN to address patient s wound care needs. FACE TO FACE ENCOUNTER: MEDICARE and MEDICAID PATIENTS: I certify that this patient is under my care and that I had a face-to-face encounter that meets the physician face-to-face encounter requirements with this patient on this date. The encounter with the patient was in whole or in part for the following MEDICAL CONDITION: (primary reason for Home Healthcare) MEDICAL NECESSITY: I certify, that based on my findings, NURSING services are a medically necessary home health service. HOME BOUND STATUS: I certify that my clinical findings support that this patient is homebound (i.e., Due to illness or injury, pt requires aid of supportive devices such as crutches, cane, wheelchairs, walkers, the use of special transportation or the assistance of another person to leave their place of residence. There is a normal inability to leave the home and doing so requires considerable and taxing effort. Other absences are for medical reasons / religious services and are infrequent or of short duration when for other reasons). If current dressing causes regression in wound condition, may D/C ordered dressing product/s and apply Normal Saline Moist Dressing daily until next Wound Healing Center / Other MD appointment. Notify Wound Healing Center of regression in wound condition at [phone]. Please direct any NON-WOUND related issues/requests for orders to patient's Primary Care Physician Wound #4 Left,Medial Foot: Continue Home  Health Visits - [facility] Nurse may visit PRN to address patient s wound care needs. FACE TO FACE ENCOUNTER: MEDICARE and MEDICAID PATIENTS: I certify  that this patient is under my care and that I had a face-to-face encounter that meets the physician face-to-face encounter requirements with this patient on this date. The encounter with the patient was in whole or in part for the following MEDICAL CONDITION: (primary reason for Home Healthcare) MEDICAL NECESSITY: I certify, that based on my findings, NURSING services are a medically necessary home health service. HOME BOUND STATUS: I certify that my clinical findings support that this patient is homebound (i.e., Due to KATTALEYA, ALIA. ([id number]) illness or injury, pt requires aid of supportive devices such as crutches, cane, wheelchairs, walkers, the use of special transportation or the assistance of another person to leave their place of residence. There is a normal inability to leave the home and doing so requires considerable and taxing effort. Other absences are for medical reasons / religious services and are infrequent or of short duration when for other reasons). If current dressing causes regression in wound condition, may D/C ordered dressing product/s and apply Normal Saline Moist Dressing daily until next Wound Healing Center / Other MD appointment. Notify Wound Healing Center of regression in wound condition at [phone]. Please direct any NON-WOUND related issues/requests for orders to patient's Primary Care Physician Wound #5 Left Achilles: Continue Home Health Visits - [facility] Nurse may visit PRN to address patient s wound care needs. FACE TO FACE ENCOUNTER: MEDICARE and MEDICAID PATIENTS: I certify that this patient is under my care and that I had a face-to-face encounter that meets the physician face-to-face encounter requirements with this patient on this date. The encounter with the patient was in whole  or in part for the following MEDICAL CONDITION: (primary reason for Home Healthcare) MEDICAL NECESSITY: I certify, that based on my findings, NURSING services are a medically necessary home health service. HOME BOUND STATUS: I certify that my clinical findings support that this patient is homebound (i.e., Due to illness or injury, pt requires aid of supportive devices such as crutches, cane, wheelchairs, walkers, the use of special transportation or the assistance of another person to leave their place of residence. There is a normal inability to leave the home and doing so requires considerable and taxing effort. Other absences are for medical reasons / religious services and are infrequent or of short duration when for other reasons). If current dressing causes regression in wound condition, may D/C ordered dressing product/s and apply Normal Saline Moist Dressing daily until next Wound Healing Center / Other MD appointment. Notify Wound Healing Center of regression in wound condition at [phone]. Please direct any NON-WOUND related issues/requests for orders to patient's Primary Care Physician Medications-please add to medication list.: Wound #2 Left Toe Great: Santyl Enzymatic Ointment Wound #3 Left,Lateral Foot: Santyl Enzymatic Ointment Wound #4 Left,Medial Foot: Santyl Enzymatic Ointment At this time I'm going to recommend that we continue with the Current wound care measures for the next week. We will subsequently see what Dr. Graciela Husbands thinks about her treatment options following his evaluation. I am encouraged by the fact that the wounds do appear to be doing somewhat better although she still has significant wound sites which are not really improving in size at this point unfortunately. Visually they do appear better. Please see above for specific wound care orders. We will see patient for re-evaluation in 1 week(s) here in the clinic. If anything worsens or changes patient will  contact our office for additional recommendations. Electronic Signature(s) Signed: 05/17/2017 5:30:29 PM By: Lenda Kelp PA-C Entered By: Larina Bras  III, Hoyt on 05/17/2017 13:33:59 [name] [name], [name] [name] ([id number]) -------------------------------------------------------------------------------- ROS/PFSH Details Patient Name: IDELLA, LAMONTAGNE. Date of Service: 05/17/2017 12:30 PM Medical Record Number: [id number] Patient Account Number: [id number] Date of Birth/Sex: 01/20/1930 (84 y.o. Female) Treating RN: Huel Coventry Primary Care Provider: Karlene Einstein Other Clinician: Referring Provider: Treating Provider/Extender: Linwood Dibbles, HOYT Weeks in Treatment: 2 Unable to Obtain Patient History due to oo Dementia Information Obtained From Patient Wound History Do you currently have one or more open woundso Yes How many open wounds do you currently haveo 4 Approximately how long have you had your woundso one month Has your wound(s) ever healed and then re-openedo No Have you had any lab work done in the past montho No Have you tested positive for an antibiotic resistant organism (MRSA, VRE)o No Have you tested positive for osteomyelitis (bone infection)o No Have you had any tests for circulation on your legso No Constitutional Symptoms (General Health) Complaints and Symptoms: No Complaints or Symptoms Eyes Medical History: Negative for: Cataracts; Glaucoma; Optic Neuritis Respiratory Complaints and Symptoms: No Complaints or Symptoms Medical History: Positive for: Chronic Obstructive Pulmonary Disease (COPD) Negative for: Aspiration; Asthma; Pneumothorax; Sleep Apnea; Tuberculosis Cardiovascular Complaints and Symptoms: No Complaints or Symptoms Medical History: Positive for: Arrhythmia - a-fib, sinus tach; Congestive Heart Failure; Coronary Artery Disease; Hypertension Negative for: Angina; Deep Vein Thrombosis; Hypotension; Myocardial Infarction; Peripheral Arterial Disease;  Peripheral Venous Disease; Phlebitis; Vasculitis Endocrine Medical History: Positive for: Type II Diabetes Negative for: Type I Diabetes [name] [name], [name] M. ([id number]) Treated with: Oral agents Blood sugar tested every day: Yes Tested : Genitourinary Medical History: Negative for: End Stage Renal Disease Immunological Medical History: Negative for: Lupus Erythematosus; Raynaudos Musculoskeletal Medical History: Positive for: Rheumatoid Arthritis Negative for: Gout; Osteoarthritis; Osteomyelitis Neurologic Medical History: Positive for: Dementia Negative for: Neuropathy; Quadriplegia; Paraplegia; Seizure Disorder Oncologic Medical History: Negative for: Received Chemotherapy; Received Radiation Psychiatric Complaints and Symptoms: No Complaints or Symptoms Medical History: Negative for: Anorexia/bulimia; Confinement Anxiety Immunizations Pneumococcal Vaccine: Received Pneumococcal Vaccination: No Implantable Devices Family and Social History Unknown if ever smoked; Marital Status - Widowed; Alcohol Use: Never; Drug Use: No History; Caffeine Use: Daily; Financial Concerns: No; Food, Clothing or Shelter Needs: No; Support System Lacking: No; Transportation Concerns: No; Advanced Directives: No; Patient does not want information on Advanced Directives; Do not resuscitate: No; Living Will: No; Medical Power of Attorney: Yes (Not Provided) Physician Affirmation I have reviewed and agree with the above information. Electronic Signature(s) Signed: 05/17/2017 5:15:14 PM By: Elliot Gurney, BSN, RN, CWS, Kim RN, BSN Signed: 05/17/2017 5:30:29 PM By: Lenda Kelp PA-C Entered By: Lenda Kelp on 05/17/2017 13:32:47 [name] [name], [name] [name] ([id number] [name] ([id number]) -------------------------------------------------------------------------------- SuperBill Details Patient Name: [name] [name]. Date of Service: 05/17/2017 Medical Record Number: [id number] Patient Account Number:  [id number] Date of Birth/Sex: [birth year] (84 y.o. 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 G30.1 Alzheimer's disease with late onset Z95.0 Presence of cardiac pacemaker Facility Procedures CPT4 Code: [id number] Description: 99214 - WOUND CARE VISIT-LEV 4 EST PT Modifier: Quantity: 1 Physician Procedures CPT4 Code: 4742595 Description: 99213 - WC PHYS LEVEL 3 - EST PT ICD-10 Diagnosis Description E11.621 Type 2 diabetes  mellitus with foot ulcer L97.523 Non-pressure chronic ulcer of other part of left foot with necr L97.323 Non-pressure chronic ulcer of left ankle with  necrosis of muscl G30.1 Alzheimer's disease with late onset Modifier: osis of muscle e Quantity: 1 Electronic Signature(s) Signed: 05/17/2017 5:30:29 PM By: Lenda Kelp PA-C Entered By: Lenda Kelp on 05/17/2017 13:34:16

## 2017-05-24 ENCOUNTER — Encounter: Payer: Medicare Other | Admitting: Physician Assistant

## 2017-05-24 ENCOUNTER — Other Ambulatory Visit
Admission: RE | Admit: 2017-05-24 | Discharge: 2017-05-24 | Disposition: A | Discharge status: Home / Self Care | Payer: Medicare Other | Source: Ambulatory Visit | Attending: Physician Assistant | Admitting: Physician Assistant

## 2017-05-24 DIAGNOSIS — B999 Unspecified infectious disease: Secondary | ICD-10-CM | POA: Insufficient documentation

## 2017-05-25 ENCOUNTER — Other Ambulatory Visit: Payer: Self-pay

## 2017-05-25 ENCOUNTER — Inpatient Hospital Stay
Admission: AD | Admit: 2017-05-25 | Discharge: 2017-05-31 | DRG: 299 | Disposition: A | Discharge status: Hospice | Payer: Medicare Other | Source: Ambulatory Visit | Attending: Internal Medicine | Admitting: Internal Medicine

## 2017-05-25 DIAGNOSIS — E43 Unspecified severe protein-calorie malnutrition: Secondary | ICD-10-CM | POA: Diagnosis present

## 2017-05-25 DIAGNOSIS — M24562 Contracture, left knee: Secondary | ICD-10-CM | POA: Diagnosis present

## 2017-05-25 DIAGNOSIS — R32 Unspecified urinary incontinence: Secondary | ICD-10-CM | POA: Diagnosis present

## 2017-05-25 DIAGNOSIS — L97529 Non-pressure chronic ulcer of other part of left foot with unspecified severity: Secondary | ICD-10-CM | POA: Diagnosis present

## 2017-05-25 DIAGNOSIS — K219 Gastro-esophageal reflux disease without esophagitis: Secondary | ICD-10-CM | POA: Diagnosis present

## 2017-05-25 DIAGNOSIS — G309 Alzheimer's disease, unspecified: Secondary | ICD-10-CM | POA: Diagnosis present

## 2017-05-25 DIAGNOSIS — Z681 Body mass index (BMI) 19 or less, adult: Secondary | ICD-10-CM

## 2017-05-25 DIAGNOSIS — I509 Heart failure, unspecified: Secondary | ICD-10-CM | POA: Diagnosis present

## 2017-05-25 DIAGNOSIS — Z950 Presence of cardiac pacemaker: Secondary | ICD-10-CM

## 2017-05-25 DIAGNOSIS — I110 Hypertensive heart disease with heart failure: Secondary | ICD-10-CM | POA: Diagnosis present

## 2017-05-25 DIAGNOSIS — Z993 Dependence on wheelchair: Secondary | ICD-10-CM

## 2017-05-25 DIAGNOSIS — Z66 Do not resuscitate: Secondary | ICD-10-CM | POA: Diagnosis present

## 2017-05-25 DIAGNOSIS — Z89422 Acquired absence of other left toe(s): Secondary | ICD-10-CM | POA: Diagnosis not present

## 2017-05-25 DIAGNOSIS — I739 Peripheral vascular disease, unspecified: Secondary | ICD-10-CM | POA: Diagnosis present

## 2017-05-25 DIAGNOSIS — E11621 Type 2 diabetes mellitus with foot ulcer: Secondary | ICD-10-CM | POA: Diagnosis present

## 2017-05-25 DIAGNOSIS — Z7189 Other specified counseling: Secondary | ICD-10-CM | POA: Diagnosis not present

## 2017-05-25 DIAGNOSIS — Z9109 Other allergy status, other than to drugs and biological substances: Secondary | ICD-10-CM

## 2017-05-25 DIAGNOSIS — J449 Chronic obstructive pulmonary disease, unspecified: Secondary | ICD-10-CM | POA: Diagnosis present

## 2017-05-25 DIAGNOSIS — E1151 Type 2 diabetes mellitus with diabetic peripheral angiopathy without gangrene: Secondary | ICD-10-CM | POA: Diagnosis present

## 2017-05-25 DIAGNOSIS — I482 Chronic atrial fibrillation: Secondary | ICD-10-CM | POA: Diagnosis present

## 2017-05-25 DIAGNOSIS — Z79899 Other long term (current) drug therapy: Secondary | ICD-10-CM | POA: Diagnosis not present

## 2017-05-25 DIAGNOSIS — M24561 Contracture, right knee: Secondary | ICD-10-CM | POA: Diagnosis present

## 2017-05-25 DIAGNOSIS — I2510 Atherosclerotic heart disease of native coronary artery without angina pectoris: Secondary | ICD-10-CM | POA: Diagnosis present

## 2017-05-25 DIAGNOSIS — Z515 Encounter for palliative care: Secondary | ICD-10-CM

## 2017-05-25 DIAGNOSIS — E119 Type 2 diabetes mellitus without complications: Secondary | ICD-10-CM | POA: Diagnosis not present

## 2017-05-25 DIAGNOSIS — I96 Gangrene, not elsewhere classified: Secondary | ICD-10-CM | POA: Diagnosis not present

## 2017-05-25 DIAGNOSIS — Z87891 Personal history of nicotine dependence: Secondary | ICD-10-CM | POA: Diagnosis not present

## 2017-05-25 DIAGNOSIS — I10 Essential (primary) hypertension: Secondary | ICD-10-CM | POA: Diagnosis not present

## 2017-05-25 DIAGNOSIS — F0280 Dementia in other diseases classified elsewhere without behavioral disturbance: Secondary | ICD-10-CM | POA: Diagnosis present

## 2017-05-25 DIAGNOSIS — I429 Cardiomyopathy, unspecified: Secondary | ICD-10-CM | POA: Diagnosis present

## 2017-05-25 DIAGNOSIS — I70248 Atherosclerosis of native arteries of left leg with ulceration of other part of lower left leg: Secondary | ICD-10-CM | POA: Diagnosis not present

## 2017-05-25 DIAGNOSIS — Z7901 Long term (current) use of anticoagulants: Secondary | ICD-10-CM

## 2017-05-25 DIAGNOSIS — L97929 Non-pressure chronic ulcer of unspecified part of left lower leg with unspecified severity: Secondary | ICD-10-CM | POA: Diagnosis not present

## 2017-05-25 DIAGNOSIS — Z7984 Long term (current) use of oral hypoglycemic drugs: Secondary | ICD-10-CM

## 2017-05-25 LAB — BASIC METABOLIC PANEL
ANION GAP: 13 (ref 5–15)
BUN: 10 mg/dL (ref 6–20)
CALCIUM: 9.5 mg/dL (ref 8.9–10.3)
CO2: 19 mmol/L — ABNORMAL LOW (ref 22–32)
Chloride: 109 mmol/L (ref 101–111)
Creatinine, Ser: 0.55 mg/dL (ref 0.44–1.00)
GFR calc Af Amer: >60 mL/min (ref >60)
GLUCOSE: 82 mg/dL (ref 65–99)
Potassium: 4.6 mmol/L (ref 3.5–5.1)
Sodium: 141 mmol/L (ref 135–145)

## 2017-05-25 LAB — CBC
HCT: 33.1 % — ABNORMAL LOW (ref 35.0–47.0)
Hemoglobin: 10.6 g/dL — ABNORMAL LOW (ref 12.0–16.0)
MCH: 29.5 pg (ref 26.0–34.0)
MCHC: 32.1 g/dL (ref 32.0–36.0)
MCV: 91.8 fL (ref 80.0–100.0)
PLATELETS: 517 10*3/uL — AB (ref 150–440)
RBC: 3.6 MIL/uL — ABNORMAL LOW (ref 3.80–5.20)
RDW: 16.3 % — AB (ref 11.5–14.5)
WBC: 5.8 10*3/uL (ref 3.6–11.0)

## 2017-05-25 LAB — GLUCOSE, CAPILLARY
Glucose-Capillary: 95 mg/dL (ref 65–99)
Glucose-Capillary: 119 mg/dL — ABNORMAL HIGH (ref 65–99)

## 2017-05-25 LAB — MRSA PCR SCREENING: MRSA by PCR: NEGATIVE

## 2017-05-25 MED ORDER — POLYETHYLENE GLYCOL 3350 17 G PO PACK: 17.0000 g | PACK | Freq: Every day | ORAL | Status: DC | PRN

## 2017-05-25 MED ORDER — VANCOMYCIN HCL IN DEXTROSE 750-5 MG/150ML-% IV SOLN
750.0000 mg | INTRAVENOUS | Status: DC
  Administered 2017-05-26 – 2017-05-28 (×3): 750 mg via INTRAVENOUS
  Filled 2017-05-25 (×4): qty 150

## 2017-05-25 MED ORDER — PIPERACILLIN-TAZOBACTAM 3.375 G IVPB
3.3750 g | Freq: Three times a day (TID) | INTRAVENOUS | Status: DC
  Administered 2017-05-25 – 2017-05-28 (×11): 3.375 g via INTRAVENOUS
  Filled 2017-05-25 (×11): qty 50

## 2017-05-25 MED ORDER — ACETAMINOPHEN 650 MG RE SUPP: 650.0000 mg | Freq: Four times a day (QID) | RECTAL | Status: DC | PRN

## 2017-05-25 MED ORDER — HEPARIN SODIUM (PORCINE) 5000 UNIT/ML IJ SOLN
5000.0000 [IU] | Freq: Three times a day (TID) | INTRAMUSCULAR | Status: DC
  Administered 2017-05-25: 5000 [IU] via SUBCUTANEOUS
  Filled 2017-05-25: qty 1

## 2017-05-25 MED ORDER — INSULIN ASPART 100 UNIT/ML ~~LOC~~ SOLN
0.0000 [IU] | Freq: Three times a day (TID) | SUBCUTANEOUS | Status: DC
  Administered 2017-05-26 (×2): 2 [IU] via SUBCUTANEOUS
  Administered 2017-05-27: 1 [IU] via SUBCUTANEOUS
  Administered 2017-05-27: 2 [IU] via SUBCUTANEOUS
  Administered 2017-05-27 – 2017-05-28 (×3): 1 [IU] via SUBCUTANEOUS
  Administered 2017-05-29: 3 [IU] via SUBCUTANEOUS
  Administered 2017-05-29: 1 [IU] via SUBCUTANEOUS
  Administered 2017-05-29: 2 [IU] via SUBCUTANEOUS
  Administered 2017-05-30 – 2017-05-31 (×5): 1 [IU] via SUBCUTANEOUS
  Filled 2017-05-25 (×15): qty 1

## 2017-05-25 MED ORDER — OXYCODONE-ACETAMINOPHEN 5-325 MG PO TABS
1.0000 | ORAL_TABLET | Freq: Four times a day (QID) | ORAL | Status: DC | PRN
  Administered 2017-05-25 – 2017-05-31 (×9): 1 via ORAL
  Filled 2017-05-25 (×9): qty 1

## 2017-05-25 MED ORDER — ENSURE ENLIVE PO LIQD
237.0000 mL | Freq: Three times a day (TID) | ORAL | Status: DC
  Administered 2017-05-25 – 2017-05-31 (×15): 237 mL via ORAL

## 2017-05-25 MED ORDER — MORPHINE SULFATE (PF) 2 MG/ML IV SOLN: 2.0000 mg | INTRAVENOUS | Status: DC | PRN

## 2017-05-25 MED ORDER — SENNA 8.6 MG PO TABS
1.0000 | ORAL_TABLET | Freq: Two times a day (BID) | ORAL | Status: DC
  Administered 2017-05-25 – 2017-05-30 (×9): 8.6 mg via ORAL
  Filled 2017-05-25 (×10): qty 1

## 2017-05-25 MED ORDER — ADULT MULTIVITAMIN W/MINERALS CH
1.0000 | ORAL_TABLET | Freq: Every day | ORAL | Status: DC
  Administered 2017-05-25 – 2017-05-29 (×5): 1 via ORAL
  Filled 2017-05-25 (×5): qty 1

## 2017-05-25 MED ORDER — ONDANSETRON HCL 4 MG PO TABS: 4.0000 mg | ORAL_TABLET | Freq: Four times a day (QID) | ORAL | Status: DC | PRN

## 2017-05-25 MED ORDER — ACETAMINOPHEN 325 MG PO TABS
650.0000 mg | ORAL_TABLET | Freq: Four times a day (QID) | ORAL | Status: DC | PRN
  Administered 2017-05-26 – 2017-05-27 (×3): 650 mg via ORAL
  Filled 2017-05-25 (×3): qty 2

## 2017-05-25 MED ORDER — VANCOMYCIN HCL IN DEXTROSE 750-5 MG/150ML-% IV SOLN
750.0000 mg | Freq: Once | INTRAVENOUS | Status: AC
  Administered 2017-05-25: 750 mg via INTRAVENOUS
  Filled 2017-05-25: qty 150

## 2017-05-25 MED ORDER — OCUVITE-LUTEIN PO CAPS
1.0000 | ORAL_CAPSULE | Freq: Every day | ORAL | Status: DC
  Administered 2017-05-26 – 2017-05-29 (×3): 1 via ORAL
  Filled 2017-05-25 (×6): qty 1

## 2017-05-25 MED ORDER — ONDANSETRON HCL 4 MG/2ML IJ SOLN: 4.0000 mg | Freq: Four times a day (QID) | INTRAMUSCULAR | Status: DC | PRN

## 2017-05-25 MED ORDER — SODIUM CHLORIDE 0.9 % IV SOLN
Freq: Once | INTRAVENOUS | Status: AC
  Administered 2017-05-25: 13:00:00 via INTRAVENOUS

## 2017-05-25 MED ORDER — VITAMIN C 500 MG PO TABS
250.0000 mg | ORAL_TABLET | Freq: Two times a day (BID) | ORAL | Status: DC
  Administered 2017-05-26 – 2017-05-30 (×8): 250 mg via ORAL
  Filled 2017-05-25 (×11): qty 0.5

## 2017-05-25 NOTE — Progress Notes (Signed)
[name] [name], [name] [name] ([id number]) Visit Report for 05/24/2017 Chief Complaint Document Details Patient Name: [name] [name], [name] [name]. Date of Service: 05/24/2017 1:30 PM Medical Record Number: [id number] Patient Account Number: [id number] Date of Birth/Sex: November 01, 1929 (84 y.o. Female) Treating RN: Ashok Cordia, Debi Primary Care Provider: Karlene Einstein Other Clinician: Referring Provider: Treating Provider/Extender: Linwood Dibbles, Domingue Coltrain Weeks in Treatment: 3 Information Obtained from: Patient Chief Complaint Left foot ulcers Electronic Signature(s) Signed: 05/25/2017 10:30:07 AM By: Lenda Kelp PA-C Entered By: Lenda Kelp on 05/24/2017 13:24:49 Clonch, [name] [name] ([id number]) -------------------------------------------------------------------------------- HPI Details Patient Name: [name] [name]. Date of Service: 05/24/2017 1:30 PM Medical Record Number: [id number] Patient Account Number: [id number] Date of Birth/Sex: 08-Aug-1929 (84 y.o. Female) Treating RN: Ashok Cordia, Debi Primary Care Provider: Karlene Einstein Other Clinician: Referring Provider: Treating Provider/Extender: Linwood Dibbles, Laddie Naeem Weeks in Treatment: 3 History of Present Illness HPI Description: 84 year old patient who has significant dementia had been seen recently in the ED for an infected toe and this was the left second toe on the plantar surface.past medical history significant for atrial fibrillation, Alzheimer's disease, carotid artery disease, cardiomyopathy, CHF, dementia, diabetes mellitus, hypertension, status post hysterectomy and pacemaker placement. x-ray done in the ER showed diffuse osteopenia with no definite evidence of osteomyelitis. the patient had strong dorsalis pedis pulse on Doppler, she was placed on Keflex and Bactroban ointment and was asked to see the wound center Readmission: 04/30/17 on evaluation today patient appears to be doing poorly in regard to her left foot. She has several ulcers noted on evaluation  today which are very degrees of depth although the posterior Achilles ulcer does show evidence of tendon involvement at this point. She does not appear to have any evidence of infection. No fevers, chills, nausea, or vomiting noted at this time. She has been seeing her podiatrist Dr. Orland Jarred who did perform the amputation on her left second toe which was back in October 2018 following her first evaluation and our clinic. The state is patient's legal guardian at this point. 05/10/17 on evaluation today I did have patientos left foot and left ankle x-rays for review. Left foot x-ray revealed osteopenia with sub optimal evaluation of the distal digits due to positioning no definite osseous abnormality. Left ankle x-ray revealed diffuse osteopenia with no definite osseous anomaly. There was no obvious osteomyelitis noted of either site per the reports. With that being said patient's wounds in general do not appear to have shown signs of significant improvement compared to my last evaluation roughly 2 weeks ago. She again has previously seen Dr. Orland Jarred though not specifically for everything that is going on at this time. In regard to the x-rays he was difficult to position her due to her severe contraction and therefore I'm also unsure whether or not Dr. Orland Jarred would even recommend MRI I do not believe she would be able to hold still for an MRI. Nonetheless at this point the Santyl does seem to be helping with loosening up some of the eschar on the medial aspect of the foot wound. The lateral aspect still is fairly firmly attached. The area on the lateral aspect of the first toe seems to be cleaning up a bit but still has slough noted. No fevers, chills, nausea, or vomiting noted at this time. 05/17/17 on evaluation today patient appears to be doing a little bit better concerning her wounds. They really are not measuring any smaller but the overall appearance does seem to be somewhat better. She has not  had the appointment  with the podiatrist as of yet although she will be seen Dr. Graciela Husbands soon. She continues to have discomfort at all wound sites unfortunately. She also is severely contracted. 05/24/17 on evaluation today patient appears to be doing about the same in general although her wounds maybe slightly smaller than previously noted. Unfortunately the medial wound on her foot shows evidence of odor as well as purulent Espejo discharge that makes me concerned somewhat for the possibility of pseudomonas. With that being said she does have her appointment with Dr. Alberteen Spindle tomorrow. Electronic Signature(s) Signed: 05/25/2017 10:30:07 AM By: Lenda Kelp PA-C Entered By: Lenda Kelp on 05/24/2017 14:15:41 Bonadonna, [name] [name] ([id number]) -------------------------------------------------------------------------------- Physical Exam Details Patient Name: [name] [name]. Date of Service: 05/24/2017 1:30 PM Medical Record Number: [id number] Patient Account Number: [id number] Date of Birth/Sex: 10-10-1929 (84 y.o. Female) Treating RN: Ashok Cordia, Debi Primary Care Provider: Karlene Einstein Other Clinician: Referring Provider: Treating Provider/Extender: Linwood Dibbles, Eryka Dolinger Weeks in Treatment: 3 Constitutional Chronically ill appearing but in no apparent acute distress. Respiratory normal breathing without difficulty. clear to auscultation bilaterally. Cardiovascular regular rate and rhythm with normal S1, S2. Psychiatric Patient is not able to cooperate in decision making regarding care. Patient is oriented to person only. pleasant and cooperative. Notes Patient's wound bed does show tendon exposure on the media wound unfortunately. This is new since last week once the slough and eschar was removed. She still continues to have discomfort it appears at this point. Electronic Signature(s) Signed: 05/25/2017 10:30:07 AM By: Lenda Kelp PA-C Entered By: Lenda Kelp on 05/24/2017  14:16:51 [name] [name], [name] [name] ([id number]) -------------------------------------------------------------------------------- Physician Orders Details Patient Name: [name] [name]. Date of Service: 05/24/2017 1:30 PM Medical Record Number: [id number] Patient Account Number: [id number] Date of Birth/Sex: 06-May-1930 (84 y.o. 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 o Cleanse wound with mild soap and water o May Shower, gently pat wound dry prior to applying new dressing. Wound #3 Left,Lateral Foot o Clean wound with Normal Saline. o Cleanse wound with mild soap and water o May Shower, gently pat wound dry prior to applying new dressing. Wound #4 Left,Medial Foot o Clean wound with Normal Saline. o Cleanse wound with mild soap and water o May Shower, gently pat wound dry prior to applying new dressing. Wound #5 Left Achilles o Clean wound with Normal Saline. o Cleanse wound with mild soap and water o May Shower, gently pat wound dry prior to applying new dressing. Anesthetic (add to Medication List) Wound #2 Left Toe Great o Topical Lidocaine 4% cream applied to wound bed prior to debridement (In Clinic Only). Wound #3 Left,Lateral Foot o Topical Lidocaine 4% cream applied to wound bed prior to debridement (In Clinic Only). Wound #4  Left,Medial Foot o Topical Lidocaine 4% cream applied to wound bed prior to debridement (In Clinic Only). Wound #5 Left Achilles o Topical Lidocaine 4% cream applied to wound bed prior to debridement (In Clinic Only). Primary Wound  Dressing YOLANDA, HUFFSTETLER ([id number]) Wound #2 Left Toe Great o Santyl Ointment Wound #3 Left,Lateral Foot o Santyl Ointment Wound #4 Left,Medial Foot o Santyl Ointment Wound #5 Left Achilles o Silvercel Non-Adherent Secondary Dressing Wound #2 Left Toe Great o ABD pad o Conform/Kerlix Wound #3 Left,Lateral Foot o ABD pad o Conform/Kerlix Wound #4 Left,Medial Foot o ABD pad o Conform/Kerlix Wound #5 Left Achilles o ABD pad o Conform/Kerlix Dressing Change Frequency Wound #2 Left Toe Great o Three times weekly Wound #3 Left,Lateral Foot o Three times weekly Wound #4 Left,Medial Foot o Three times weekly Wound #5 Left Achilles o Three times weekly Follow-up Appointments Wound #2 Left Toe Great o Return Appointment in 1 week. Wound #3 Left,Lateral Foot o Return Appointment in 1 week. Wound #4 Left,Medial Foot o Return Appointment in 1 week. Wound #5 Left Achilles o Return Appointment in 1 week. Home Health [name] [name], [name] [name] ([id number]) Wound #2 Left Toe Doretha Sou Continue [facility] Visits - [facility] o Home Health Nurse may visit PRN to address patientos wound care needs. o FACE TO FACE ENCOUNTER: MEDICARE and MEDICAID PATIENTS: I certify that this patient is under my care and that I had a face-to-face encounter that meets the physician face-to-face encounter requirements with this patient on this date. The encounter with the patient was in whole or in part for the following MEDICAL CONDITION: (primary reason for Home Healthcare) MEDICAL NECESSITY: I certify, that based on my findings, NURSING services are a medically necessary home health service. HOME BOUND STATUS: I certify that my clinical  findings support that this patient is homebound (i.e., Due to illness or injury, pt requires aid of supportive devices such as crutches, cane, wheelchairs, walkers, the use of special transportation or the assistance of another person to leave their place of residence. There is a normal inability to leave the home and doing so requires considerable and taxing effort. Other absences are for medical reasons / religious services and are infrequent or of short duration when for other reasons). o If current dressing causes regression in wound condition, may D/C ordered dressing product/s and apply Normal Saline Moist Dressing daily until next Wound Healing Center / Other MD appointment. Notify Wound Healing Center of regression in wound condition at [phone]. o Please direct any NON-WOUND related issues/requests for orders to patient's Primary Care Physician Wound #3 Left,Lateral Foot o Continue [facility] Visits - [facility] o Home Health Nurse may visit PRN to address patientos wound care needs. o FACE TO FACE ENCOUNTER: MEDICARE and MEDICAID PATIENTS: I certify that this patient is under my care and that I had a face-to-face encounter that meets the physician face-to-face encounter requirements with this patient on this date. The encounter with the patient was in whole or in part for the following MEDICAL CONDITION: (primary reason for Home Healthcare) MEDICAL NECESSITY: I certify, that based on my findings, NURSING services are a medically necessary home health service. HOME BOUND STATUS: I certify that my clinical findings support that this patient is homebound (i.e., Due to illness or injury, pt requires aid of supportive devices such as crutches, cane, wheelchairs, walkers, the use of special transportation or the assistance of another person to leave their place of residence. There is a normal inability to leave the home and doing so requires considerable and taxing effort. Other  absences are for medical reasons / religious services and are infrequent or of short duration when for other reasons). o If current dressing causes regression in wound  condition, may D/C ordered dressing product/s and apply Normal Saline Moist Dressing daily until next Wound Healing Center / Other MD appointment. Notify Wound Healing Center of regression in wound condition at [phone]. o Please direct any NON-WOUND related issues/requests for orders to patient's Primary Care Physician Wound #4 Left,Medial Foot o Continue [facility] Vis[facility] - [facility] o Home Health Nurse may visit PRN to address patientos wound care needs. o FACE TO FACE ENCOUNTER: MEDICARE and MEDICAID PATIENTS: I certify that this patient is under my care and that I had a face-to-face encounter that meets the physician face-to-face encounter requirements with this patient on this date. The encounter with the patient was in whole or in part for the following MEDICAL CONDITION: (primary reason for Home Healthcare) MEDICAL NECESSITY: I certify, that based on my findings, NURSING services are a medically necessary home health service. HOME BOUND STATUS: I certify that my clinical findings support that this patient is homebound (i.e., Due to illness or injury, pt requires aid of supportive devices such as crutches, cane, wheelchairs, walkers, the use of special transportation or the assistance of another person to leave their place of residence. There is a normal inability to leave the home and doing so requires considerable and taxing effort. Other absences are for medical reasons / religious services and are infrequent or of short duration when for other reasons). o If current dressing causes regression in wound condition, may D/C ordered dressing product/s and apply Normal Saline Moist Dressing daily until next Wound Healing Center / Other MD appointment. Notify Wound Healing Center of regression in wound condition  at [phone]. o Please direct any NON-WOUND related issues/requests for orders to patient's Primary Care Physician Wound #5 Left Achilles o Continue [facility] Vis[facility] - [facility] o Home Health Nurse may visit PRN to address patientos wound care needs. [name] [name], [name] [name] ([id number]) o FACE TO FACE ENCOUNTER: MEDICARE and MEDICAID PATIENTS: I certify that this patient is under my care and that I had a face-to-face encounter that meets the physician face-to-face encounter requirements with this patient on this date. The encounter with the patient was in whole or in part for the following MEDICAL CONDITION: (primary reason for Home Healthcare) MEDICAL NECESSITY: I certify, that based on my findings, NURSING services are a medically necessary home health service. HOME BOUND STATUS: I certify that my clinical findings support that this patient is homebound (i.e., Due to illness or injury, pt requires aid of supportive devices such as crutches, cane, wheelchairs, walkers, the use of special transportation or the assistance of another person to leave their place of residence. There is a normal inability to leave the home and doing so requires considerable and taxing effort. Other absences are for medical reasons / religious services and are infrequent or of short duration when for other reasons). o If current dressing causes regression in wound condition, may D/C ordered dressing product/s and apply Normal Saline Moist Dressing daily until next Wound Healing Center / Other MD appointment. Notify Wound Healing Center of regression in wound condition at [phone]. o Please direct any NON-WOUND related issues/requests for orders to patient's Primary Care Physician Medications-please add to medication list. Wound #2 Left Toe Great o Santyl Enzymatic Ointment Wound #3 Left,Lateral Foot o Santyl Enzymatic Ointment Wound #4 Left,Medial Foot o P.O. Antibiotics o Santyl Enzymatic  Ointment Patient Medications Allergies: No Known Allergies Notifications Medication Indication Start End lidocaine DOSE 1 - topical 4 % cream - 1 cream topical Cipro 05/24/2017 DOSE oral 250  mg tablet - tablet oral 2 times a day for 10 days Electronic Signature(s) Signed: 05/24/2017 2:19:09 PM By: Lenda Kelp PA-C Entered By: Lenda Kelp on 05/24/2017 14:19:07 [name] [name], [name] [name] ([id number]) -------------------------------------------------------------------------------- Prescription 05/24/2017 Patient Name: [name] [name]. Provider: Lenda Kelp PA-C Date of Birth: 08-25-1929 NPI#: [id number] Sex: F DEA#: [id number] Phone #: [phone] License #: Patient Address: [facility] [address] [place], [place] [address] [place], [place] [address] [phone] Allergies No Known Allergies Medication Medication: Route: Strength: Form: lidocaine 4 % topical cream topical 4% cream Class: TOPICAL LOCAL ANESTHETICS Dose: Frequency / Time: Indication: 1 1 cream topical Number of Refills: Number of Units: 0 Generic Substitution: Start Date: End Date: One Time Use: Substitution Permitted No Note to Pharmacy: Signature(s): Date(s): Electronic Signature(s) Signed: 05/25/2017 10:30:07 AM By: Lenda Kelp PA-C Entered By: Lenda Kelp on 05/24/2017 14:19:09 [name] [name], [name] [name] ([id number]) --------------------------------------------------------------------------------  Problem List Details Patient Name: [name] [name]. Date of Service: 05/24/2017 1:30 PM Medical Record Number: [id number] Patient Account Number: [id number] Date of Birth/Sex: 1929-12-15 (84 y.o. 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 Date of Service: 05/24/2017 1:30 PM Medical Record Number: [id number] Patient Account Number: [id number] Date of Birth/Sex: 1929/11/20 (84 y.o. Female) Treating RN: Ashok Cordia, Debi Primary Care Provider: Karlene Einstein Other Clinician: Referring Provider: Treating Provider/Extender: Linwood Dibbles, Deshanna Kama Weeks in Treatment: 3 Subjective Chief Complaint Information obtained from Patient Left foot ulcers History of Present Illness (HPI) 84 year old patient who has significant dementia had been seen recently in the ED for an infected toe and this was the left second toe on the plantar surface.past medical history significant for atrial fibrillation, Alzheimer's disease, carotid artery disease, cardiomyopathy, CHF, dementia, diabetes mellitus, hypertension, status post hysterectomy and pacemaker placement. x-ray done in the ER showed diffuse osteopenia with no definite evidence of osteomyelitis. the patient had strong dorsalis pedis pulse on Doppler, she was placed on Keflex and Bactroban ointment and was asked to see the wound center Readmission: 04/30/17 on evaluation today patient appears to be doing poorly in regard to her left foot. She has  several ulcers noted on evaluation today which are very degrees of depth although the posterior Achilles ulcer does show evidence of tendon involvement at this point. She does not appear to have any evidence of infection. No fevers, chills, nausea, or vomiting noted at this time. She has been seeing her podiatrist Dr. Orland Jarred who did perform the amputation on her left second toe which was back in October 2018 following her first evaluation and our clinic. The state is patient's legal guardian at this point. 05/10/17 on evaluation today I did have patient s left foot and left ankle x-rays for review. Left foot  x-ray revealed osteopenia with sub optimal evaluation of the distal digits due to positioning no definite osseous abnormality. Left ankle x-ray revealed diffuse osteopenia with no definite osseous anomaly. There was no obvious osteomyelitis noted of either site per the reports. With that being said patient's wounds in general do not appear to have shown signs of significant improvement compared to my last evaluation roughly 2 weeks ago. She again has previously seen Dr. Orland Jarred though not specifically for everything that is going on at this time. In regard to the x-rays he was difficult to position her due to her severe contraction and therefore I'm also unsure whether or not Dr. Orland Jarred would even recommend MRI I do not believe she would be able to hold still for an MRI. Nonetheless at this point the Santyl does seem to be helping with loosening up some of the eschar on the medial aspect of the foot wound. The lateral aspect still is fairly firmly attached. The area on the lateral aspect of the first toe seems to be cleaning up a bit but still has slough noted. No fevers, chills, nausea, or vomiting noted at this time. 05/17/17 on evaluation today patient appears to be doing a little bit better concerning her wounds. They really are not measuring any smaller but the overall appearance does seem  to be somewhat better. She has not had the appointment with the podiatrist as of yet although she will be seen Dr. Graciela Husbands soon. She continues to have discomfort at all wound sites unfortunately. She also is severely contracted. 05/24/17 on evaluation today patient appears to be doing about the same in general although her wounds maybe slightly smaller than previously noted. Unfortunately the medial wound on her foot shows evidence of odor as well as purulent Horiuchi discharge that makes me concerned somewhat for the possibility of pseudomonas. With that being said she does have her appointment with Dr. Alberteen Spindle tomorrow. [name] [name], [name] [name] ([id number]) Patient History Unable to Obtain Patient History due to Dementia. Information obtained from Patient. Social History Unknown if ever smoked, Marital Status - Widowed, Alcohol Use - Never, Drug Use - No History, Caffeine Use - Daily. Review of Systems (ROS) Constitutional Symptoms (General Health) Denies complaints or symptoms of Fever, Chills. Respiratory The patient has no complaints or symptoms. Cardiovascular The patient has no complaints or symptoms. Psychiatric The patient has no complaints or symptoms. Objective Constitutional Chronically ill appearing but in no apparent acute distress. Vitals Time Taken: 1:36 PM, Height: 62 in, Weight: 140 lbs, BMI: 25.6, Temperature: 97.5 F, Pulse: 80 bpm, Respiratory Rate: 16 breaths/min, Blood Pressure: 82/47 mmHg. General Notes: BP taken manually and it was 82/60, taken again manually by another staff member Kennon Rounds and it was 86/66. Leonard Schwartz, [place] made aware. Respiratory normal breathing without difficulty. clear to auscultation bilaterally. Cardiovascular regular rate and rhythm with normal S1, S2. Psychiatric Patient is not able to cooperate in decision making regarding care. Patient is oriented to person only. pleasant and cooperative. General Notes: Patient's wound bed does show tendon exposure on the  media wound unfortunately. This is new since last week once the slough and eschar was removed. She still continues to have discomfort it appears at this point. Integumentary (Hair, Skin) Wound #2 status is Open. Original cause of wound was Gradually Appeared. The wound is located on the Left Toe Great. The wound measures 0.8cm length x 0.9cm width x 0.2cm depth; 0.565cm^2 area and 0.113cm^3 volume. Wound #3 status is Open. Original cause of wound was  Gradually Appeared. The wound is located on the Left,Lateral Foot. The wound measures 2cm length x 5.4cm width x 0.1cm depth; 8.482cm^2 area and 0.848cm^3 volume. Wound #4 status is Open. Original cause of wound was Gradually Appeared. The wound is located on the Left,Medial Foot. [name] [name], [name] [name] ([id number]) The wound measures 6.5cm length x 3.9cm width x 0.6cm depth; 19.91cm^2 area and 11.946cm^3 volume. Wound #5 status is Open. Original cause of wound was Gradually Appeared. The wound is located on the Left Achilles. The wound measures 0.3cm length x 0.5cm width x 0.2cm depth; 0.118cm^2 area and 0.024cm^3 volume. 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 Cleanse wound with mild soap and water May Shower, gently pat wound dry prior to applying new dressing. Wound #3 Left,Lateral Foot: Clean wound with Normal Saline. Cleanse wound with mild soap and water May Shower, gently pat wound dry prior to applying new dressing. Wound #4 Left,Medial Foot: Clean wound with Normal Saline. Cleanse wound with mild soap and water May Shower, gently pat wound dry prior to applying new  dressing. Wound #5 Left Achilles: Clean wound with Normal Saline. Cleanse wound with mild soap and water May Shower, gently pat wound dry prior to applying new dressing. Anesthetic (add to Medication List): Wound #2 Left Toe Great: Topical Lidocaine 4% cream applied to wound bed prior to debridement (In Clinic Only). Wound #3 Left,Lateral Foot: Topical Lidocaine 4% cream applied to wound bed prior to debridement (In Clinic Only). Wound #4 Left,Medial Foot: Topical Lidocaine 4% cream applied to wound bed prior to debridement (In Clinic Only). Wound #5 Left Achilles: Topical Lidocaine 4% cream applied to wound bed prior to debridement (In Clinic Only). Primary Wound Dressing: Wound #2 Left Toe Great: Santyl Ointment Wound #3 Left,Lateral Foot: Santyl Ointment Wound #4 Left,Medial Foot: [name] [name], [name] [name] ([id number]) Santyl Ointment Wound #5 Left Achilles: Silvercel Non-Adherent Secondary Dressing: Wound #2 Left Toe Great: ABD pad Conform/Kerlix Wound #3 Left,Lateral Foot: ABD pad Conform/Kerlix Wound #4 Left,Medial Foot: ABD pad Conform/Kerlix Wound #5 Left Achilles: ABD pad Conform/Kerlix Dressing Change Frequency: Wound #2 Left Toe Great: Three times weekly Wound #3 Left,Lateral Foot: Three times weekly Wound #4 Left,Medial Foot: Three times weekly Wound #5 Left Achilles: Three times weekly Follow-up Appointments: Wound #2 Left Toe Great: Return Appointment in 1 week. Wound #3 Left,Lateral Foot: Return Appointment in 1 week. Wound #4 Left,Medial Foot: Return Appointment in 1 week. Wound #5 Left Achilles: Return Appointment in 1 week. Home Health: Wound #2 Left Toe Great: Continue Home Health Visits - [facility] Nurse may visit PRN to address patient s wound care needs. FACE TO FACE ENCOUNTER: MEDICARE and MEDICAID PATIENTS: I certify that this patient is under my care and that I had a face-to-face encounter that meets the physician face-to-face  encounter requirements with this patient on this date. The encounter with the patient was in whole or in part for the following MEDICAL CONDITION: (primary reason for Home Healthcare) MEDICAL NECESSITY: I certify, that based on my findings, NURSING services are a medically necessary home health service. HOME BOUND STATUS: I certify that my clinical findings support that this patient is homebound (i.e., Due  to illness or injury, pt requires aid of supportive devices such as crutches, cane, wheelchairs, walkers, the use of special transportation or the assistance of another person to leave their place of residence. There is a normal inability to leave the home and doing so requires considerable and taxing effort. Other absences are for medical reasons / religious services and are infrequent or of short duration when for other reasons). If current dressing causes regression in wound condition, may D/C ordered dressing product/s and apply Normal Saline Moist Dressing daily until next Wound Healing Center / Other MD appointment. Notify Wound Healing Center of regression in wound condition at [phone]. Please direct any NON-WOUND related issues/requests for orders to patient's Primary Care Physician Wound #3 Left,Lateral Foot: Continue Home Health Visits - [facility] Nurse may visit PRN to address patient s wound care needs. FACE TO FACE ENCOUNTER: MEDICARE and MEDICAID PATIENTS: I certify that this patient is under my care and that I had a face-to-face encounter that meets the physician face-to-face encounter requirements with this patient on this date. The encounter with the patient was in whole or in part for the following MEDICAL CONDITION: (primary reason for Home Healthcare) MEDICAL NECESSITY: I certify, that based on my findings, NURSING services are a medically necessary home health service. HOME BOUND STATUS: I certify that my clinical findings support that this patient is  homebound (i.e., Due to illness or injury, pt requires aid of supportive devices such as crutches, cane, wheelchairs, walkers, the use of special [name] [name], [name] M. ([id number]) transportation or the assistance of another person to leave their place of residence. There is a normal inability to leave the home and doing so requires considerable and taxing effort. Other absences are for medical reasons / religious services and are infrequent or of short duration when for other reasons). If current dressing causes regression in wound condition, may D/C ordered dressing product/s and apply Normal Saline Moist Dressing daily until next Wound Healing Center / Other MD appointment. Notify Wound Healing Center of regression in wound condition at [phone]. Please direct any NON-WOUND related issues/requests for orders to patient's Primary Care Physician Wound #4 Left,Medial Foot: Continue Home Health Visits - [facility] Nurse may visit PRN to address patient s wound care needs. FACE TO FACE ENCOUNTER: MEDICARE and MEDICAID PATIENTS: I certify that this patient is under my care and that I had a face-to-face encounter that meets the physician face-to-face encounter requirements with this patient on this date. The encounter with the patient was in whole or in part for the following MEDICAL CONDITION: (primary reason for Home Healthcare) MEDICAL NECESSITY: I certify, that based on my findings, NURSING services are a medically necessary home health service. HOME BOUND STATUS: I certify that my clinical findings support that this patient is homebound (i.e., Due to illness or injury, pt requires aid of supportive devices such as crutches, cane, wheelchairs, walkers, the use of special transportation or the assistance of another person to leave their place of residence. There is a normal inability to leave the home and doing so requires considerable and taxing effort. Other absences are for medical reasons  / religious services and are infrequent or of short duration when for other reasons). If current dressing causes regression in wound condition, may D/C ordered dressing product/s and apply Normal Saline Moist Dressing daily until next Wound Healing Center / Other MD appointment. Notify Wound Healing Center of regression in wound condition at [phone]. Please direct any NON-WOUND related issues/requests  for orders to patient's Primary Care Physician Wound #5 Left Achilles: Continue Home Health Visits - [facility] Nurse may visit PRN to address patient s wound care needs. FACE TO FACE ENCOUNTER: MEDICARE and MEDICAID PATIENTS: I certify that this patient is under my care and that I had a face-to-face encounter that meets the physician face-to-face encounter requirements with this patient on this date. The encounter with the patient was in whole or in part for the following MEDICAL CONDITION: (primary reason for Home Healthcare) MEDICAL NECESSITY: I certify, that based on my findings, NURSING services are a medically necessary home health service. HOME BOUND STATUS: I certify that my clinical findings support that this patient is homebound (i.e., Due to illness or injury, pt requires aid of supportive devices such as crutches, cane, wheelchairs, walkers, the use of special transportation or the assistance of another person to leave their place of residence. There is a normal inability to leave the home and doing so requires considerable and taxing effort. Other absences are for medical reasons / religious services and are infrequent or of short duration when for other reasons). If current dressing causes regression in wound condition, may D/C ordered dressing product/s and apply Normal Saline Moist Dressing daily until next Wound Healing Center / Other MD appointment. Notify Wound Healing Center of regression in wound condition at [phone]. Please direct any NON-WOUND related  issues/requests for orders to patient's Primary Care Physician Medications-please add to medication list.: Wound #2 Left Toe Great: Santyl Enzymatic Ointment Wound #3 Left,Lateral Foot: Santyl Enzymatic Ointment Wound #4 Left,Medial Foot: P.O. Antibiotics Santyl Enzymatic Ointment The following medication(s) was prescribed: lidocaine topical 4 % cream 1 1 cream topical was prescribed at facility Cipro oral 250 mg tablet tablet oral 2 times a day for 10 days starting 05/24/2017 I'm gonna recommend that we continue with the Current wound care measures although I am going to add Cipro to her regimen. We will see what Dr. Alberteen Spindle has to say tomorrow and then subsequently will see patient back for reevaluation in one week. [name] [name], [name] [name] ([id number]) I am going to recommend that patients caregiver contact her primary care provider due to the fact that the blood pressure is somewhat low today compared to last week's evaluation and this is both with machine and manually. I want to ensure they do not feel anything needs to be addressed in this regard. Please see above for specific wound care orders. We will see patient for re-evaluation in 1 week(s) here in the clinic. If anything worsens or changes patient will contact our office for additional recommendations. Electronic Signature(s) Signed: 05/25/2017 10:30:07 AM By: Lenda Kelp PA-C Entered By: Lenda Kelp on 05/24/2017 14:19:53 [place], [name] [name] ([id number]) -------------------------------------------------------------------------------- ROS/PFSH Details Patient Name: [name] [name]. Date of Service: 05/24/2017 1:30 PM Medical Record Number: [id number] Patient Account Number: [id number] Date of Birth/Sex: 23-Apr-1930 (84 y.o. Female) Treating RN: Ashok Cordia, Debi Primary Care Provider: Karlene Einstein Other Clinician: Referring Provider: Treating Provider/Extender: Linwood Dibbles, Lada Fulbright Weeks in Treatment: 3 Unable to Obtain Patient History due  to oo Dementia Information Obtained From Patient Wound History Do you currently have one or more open woundso Yes How many open wounds do you currently haveo 4 Approximately how long have you had your woundso one month Has your wound(s) ever healed and then re-openedo No Have you had any lab work done in the past montho No Have you tested positive for an antibiotic resistant organism (MRSA, VRE)o No Have you  tested positive for osteomyelitis (bone infection)o No Have you had any tests for circulation on your legso No Constitutional Symptoms (General Health) Complaints and Symptoms: Negative for: Fever; Chills Eyes Medical History: Negative for: Cataracts; Glaucoma; Optic Neuritis Respiratory Complaints and Symptoms: No Complaints or Symptoms Medical History: Positive for: Chronic Obstructive Pulmonary Disease (COPD) Negative for: Aspiration; Asthma; Pneumothorax; Sleep Apnea; Tuberculosis Cardiovascular Complaints and Symptoms: No Complaints or Symptoms Medical History: Positive for: Arrhythmia - a-fib, sinus tach; Congestive Heart Failure; Coronary Artery Disease; Hypertension Negative for: Angina; Deep Vein Thrombosis; Hypotension; Myocardial Infarction; Peripheral Arterial Disease; Peripheral Venous Disease; Phlebitis; Vasculitis Endocrine Medical History: Positive for: Type II Diabetes Negative for: Type I Diabetes Bou, Kyiah M. ([id number]) Treated with: Oral agents Blood sugar tested every day: Yes Tested : Genitourinary Medical History: Negative for: End Stage Renal Disease Immunological Medical History: Negative for: Lupus Erythematosus; Raynaudos Musculoskeletal Medical History: Positive for: Rheumatoid Arthritis Negative for: Gout; Osteoarthritis; Osteomyelitis Neurologic Medical History: Positive for: Dementia Negative for: Neuropathy; Quadriplegia; Paraplegia; Seizure Disorder Oncologic Medical History: Negative for: Received Chemotherapy; Received  Radiation Psychiatric Complaints and Symptoms: No Complaints or Symptoms Medical History: Negative for: Anorexia/bulimia; Confinement Anxiety Immunizations Pneumococcal Vaccine: Received Pneumococcal Vaccination: No Implantable Devices Family and Social History Unknown if ever smoked; Marital Status - Widowed; Alcohol Use: Never; Drug Use: No History; Caffeine Use: Daily; Financial Concerns: No; Food, Clothing or Shelter Needs: No; Support System Lacking: No; Transportation Concerns: No; Advanced Directives: No; Patient does not want information on Advanced Directives; Do not resuscitate: No; Living Will: No; Medical Power of Attorney: Yes (Not Provided) Physician Affirmation I have reviewed and agree with the above information. Electronic Signature(s) Signed: 05/24/2017 5:03:00 PM By: Alejandro Mulling Signed: 05/25/2017 10:30:07 AM By: Lenda Kelp PA-C Entered By: Lenda Kelp on 05/24/2017 14:16:05 Loder, [name] [name] ([id number] [name] ([id number]) -------------------------------------------------------------------------------- SuperBill Details Patient Name: [name] [name]. Date of Service: 05/24/2017 Medical Record Number: [id number] Patient Account Number: [id number] Date of Birth/Sex: 26-Nov-1929 (84 y.o. 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 G30.1 Alzheimer's disease with late onset Z95.0 Presence of cardiac pacemaker Facility Procedures CPT4 Code: [id number] Description: [id number] - WOUND CARE VISIT-LEV 5 EST PT Modifier: Quantity: 1 Physician Procedures CPT4 Code:  [id number] Description: 99213 - WC PHYS LEVEL 3 - EST PT ICD-10 Diagnosis Description E11.621 Type 2 diabetes mellitus with foot ulcer L97.523 Non-pressure chronic ulcer of other part of left foot with necr L97.323 Non-pressure chronic ulcer of left ankle with  necrosis of muscl G30.1 Alzheimer's disease with late onset Modifier: osis of muscle e Quantity: 1 Electronic Signature(s) Signed: 05/24/2017 4:28:49 PM By: Alejandro Mulling Signed: 05/25/2017 10:30:07 AM By: Lenda Kelp PA-C Entered By: Alejandro Mulling on 05/24/2017 16:28:49

## 2017-05-25 NOTE — Care Management (Signed)
Patient is direct admit from podiatry for non healing wound left foot..  Vascular consult is pending for possible BKA.  patient has had [facility] in the past.  Have [facility] message with [facility].

## 2017-05-25 NOTE — Progress Notes (Signed)
Initial Nutrition Assessment  DOCUMENTATION CODES:   Severe malnutrition in context of chronic illness  INTERVENTION:   Ocuvite daily for wound healing (provides zinc, vitamin A, vitamin C, Vitamin E, copper, and selenium)  Vitamin C 250mg  po BID  MVI daily  Ensure Enlive po TID, each supplement provides 350 kcal and 20 grams of protein  Magic cup TID with meals, each supplement provides 290 kcal and 9 grams of protein  Dysphagia 1 diet  NUTRITION DIAGNOSIS:   Severe Malnutrition related to chronic illness(COPD, CHF, DM, dementia) as evidenced by 25 percent weight loss in 2 months, severe fat depletion, severe muscle depletion.  GOAL:   Patient will meet greater than or equal to 90% of their needs  MONITOR:   PO intake, Supplement acceptance, Labs, Weight trends, Skin, I & O's  REASON FOR ASSESSMENT:   Consult Assessment of nutrition requirement/status  ASSESSMENT:   84 y.o. female with a known history of COPD, CHF, DM, vascular disease, and dementia admitted for L foot infection    Visited pt's room today; unable to speak with pt r/t dementia. Per chart, pt with 34lb(25%) weight loss in 2 months; this is severe weight loss. Pt noted to have severe edema at last admit; unsure if some weight loss r/t fluid changes, RD suspects pt with poor appetite and oral intake at baseline. Pt ate 30% of her lunch today. Pt with poor dentition on dysphagia 2 diet last admit now on dysphagia 1. RD will order Ensure and Magic cups to provide protein for wound healing. Recommend Ocuvite and vitamin C supplementation for wound healing.    Medications reviewed and include: insulin, senokot, zosyn, vancomycin  Labs reviewed: K 4.6 wnl, Mg 2.2 wnl Hgb 10.6(L), Hct 33.1(L)  Nutrition-Focused physical exam completed. Findings are severe fat depletions in arms and chest, moderate to severe muscle depletions over entire body, and no edema.   Diet Order:  DIET - DYS 1 Room service appropriate?  Yes; Fluid consistency: Thin Diet NPO time specified  EDUCATION NEEDS:   Not appropriate for education at this time  Skin:  Skin Assessment: (left foot infection, pressure wound L hip )  Last BM:  1/15- smear  Height:   Ht Readings from Last 1 Encounters:  05/25/17 5\' 3"  (1.6 m)    Weight:   Wt Readings from Last 1 Encounters:  05/25/17 101 lb 12.8 oz (46.2 kg)    Ideal Body Weight:  52.3 kg  BMI:  Body mass index is 18.03 kg/m.  Estimated Nutritional Needs:   Kcal:  1200-1400kcal/day   Protein:  67-78g/day   Fluid:  >1.2L/day   MS, RD, LDN Pager #[phone] After Hours Pager: [phone]

## 2017-05-25 NOTE — Progress Notes (Signed)
Per MD okay for nurse to change diet order.

## 2017-05-25 NOTE — Clinical Social Work Note (Signed)
Clinical Social Work Assessment  Patient Details  Name: [name] [name] MRN: [id number] Date of Birth: [birth year]  Date of referral:  05/25/17               Reason for consult:  Discharge Planning                Permission sought to share information with:    Permission granted to share information::     Name::        Agency::     Relationship::     Contact Information:     Housing/Transportation Living arrangements for the past 2 months:  Group Home Source of Information:  Guardian Patient Interpreter Needed:  None Criminal Activity/Legal Involvement Pertinent to Current Situation/Hospitalization:  No - Comment as needed Significant Relationships:  Merchandiser, retail Lives with:  Facility Resident Do you feel safe going back to the place where you live?  Yes Need for family participation in patient care:     Care giving concerns:  Patient resides at [facility].    Social Worker assessment / plan:  Patient has DSS Guardian: Lazaro Arms: [phone]. CSW spoke with Ms. Flack this afternoon and discussed patient's admission and the potential for a BKA. CSW asked if it was recommended for STR would she be in agreement and she stated she would be in agreement. Will need to see what treatment plan is decided and what recommendations will be made. Will continue to follow.  Employment status:  Retired Health and safety inspector:  Medicare PT Recommendations:    Information / Referral to community resources:     Patient/Family's Response to care:  Patient's guardian expressed appreciation for CSW assistance.  Patient/Family's Understanding of and Emotional Response to Diagnosis, Current Treatment, and Prognosis:  Ms. [name] [name] is involved with patient's treatment plan.  Emotional Assessment Appearance:    Attitude/Demeanor/Rapport:    Affect (typically observed):    Orientation:  Oriented to Self, Oriented to Place Alcohol / Substance use:  Not Applicable Psych involvement (Current and  /or in the community):  No (Comment)  Discharge Needs  Concerns to be addressed:    Readmission within the last 30 days:  No Current discharge risk:  None Barriers to Discharge:  No Barriers Identified   York Spaniel, LCSW 05/25/2017, 3:05 PM

## 2017-05-25 NOTE — H&P (Addendum)
[facility] [place] [facility]   PATIENT NAME: [name] [name]    MR#:  [id number]  DATE OF BIRTH:  May 27, 1929  DATE OF ADMISSION:  05/25/2017  PRIMARY CARE PHYSICIAN: Angela Cox, MD   REQUESTING/REFERRING PHYSICIAN: Dr. Alberteen Spindle.  CHIEF COMPLAINT:  No chief complaint on file.  Left foot infection HISTORY OF PRESENT ILLNESS:  [name] [name]  is a 84 y.o. female with a known history of multiple medical problems as below.  The patient was sent for direct admission for left foot infection by Dr. Alberteen Spindle.  The patient is a demented, unable to provide any information.  According to Dr. Alberteen Spindle, the patient has nonhealing left foot ulcer infection, need vascular surgery consult for possible BKA.  PAST MEDICAL HISTORY:   Past Medical History:  Diagnosis Date  . A-fib (HCC)   . Allergic rhinitis   . Alzheimer disease   . CAD (coronary artery disease)   . Cardiomyopathy (HCC)   . CHF (congestive heart failure) (HCC)   . COPD (chronic obstructive pulmonary disease) (HCC)   . Dementia   . Diabetes mellitus without complication (HCC)   . Diverticulosis   . Eczema   . Hyperlipemia   . Hypertension   . Urinary incontinence     PAST SURGICAL HISTORY:   Past Surgical History:  Procedure Laterality Date  . AMPUTATION TOE Left 02/25/2017   Procedure: AMPUTATION TOE-LEFT 2ND TOE;  Surgeon: Recardo Evangelist, DPM;  Location: [facility] ORS;  Service: Podiatry;  Laterality: Left;  . HYSTEROTOMY    . none    . PACEMAKER PLACEMENT      SOCIAL HISTORY:   Social History   Tobacco Use  . Smoking status: Former Games developer  . Smokeless tobacco: Former Engineer, water Use Topics  . Alcohol use: No    FAMILY HISTORY:   Family History  Problem Relation Age of Onset  . Hypertension Father     DRUG ALLERGIES:  No Known Allergies  REVIEW OF SYSTEMS:   Review of Systems  Unable to perform ROS: Dementia    MEDICATIONS AT HOME:   Prior to Admission medications     Medication Sig Start Date End Date Taking? Authorizing Provider  acetaminophen (TYLENOL) 500 MG tablet Take 1,000 mg by mouth 3 (three) times [name]. Take for arthritis pain.    [provider]  albuterol (PROVENTIL HFA;VENTOLIN HFA) 108 (90 Base) MCG/ACT inhaler Inhale 2 puffs into the lungs every 6 (six) hours as needed for wheezing or shortness of breath.    [provider]  apixaban (ELIQUIS) 2.5 MG TABS tablet Take 1 tablet (2.5 mg total) by mouth 2 (two) times [name]. 03/03/17   Ramonita Lab, MD  diclofenac sodium (VOLTAREN) 1 % GEL Apply 2 g topically 2 (two) times [name].    [provider]  fexofenadine (ALLEGRA) 180 MG tablet Take 180 mg by mouth [name].    [provider]  fluticasone (FLONASE) 50 MCG/ACT nasal spray Place 1 spray into both nostrils [name].    [provider]  glipiZIDE (GLUCOTROL) 5 MG tablet Take 5 mg by mouth [name].     [provider]  ipratropium (ATROVENT) 0.02 % nebulizer solution Take 0.5 mg by nebulization [name].    [provider]  metFORMIN (GLUCOPHAGE) 1000 MG tablet Take 1,000 mg by mouth 2 (two) times [name] with a meal.    [provider]  metoprolol tartrate (LOPRESSOR) 25 MG tablet Take 1 tablet (25 mg total) by mouth  2 (two) times [name]. 03/03/17   Ramonita Lab, MD  metoprolol tartrate (LOPRESSOR) 25 mg/10 mL SUSP Take 50 mg by mouth 2 (two) times [name].    [provider]  mirtazapine (REMERON) 7.5 MG tablet Take 7.5 mg by mouth at bedtime.    [provider]  montelukast (SINGULAIR) 10 MG tablet Take 10 mg by mouth every morning.     [provider]  mupirocin ointment (BACTROBAN) 2 % Apply to affected area 3 times [name] 02/12/17 02/12/18  Emily Filbert, MD  omeprazole (PRILOSEC) 20 MG capsule Take 20 mg by mouth 2 (two) times [name] before a meal. Take 30 minutes before breakfast.    [provider]  polyethylene glycol (MIRALAX / GLYCOLAX)  packet Take 17 g by mouth [name]. 03/04/17   Gouru, Deanna Artis, MD  potassium chloride 20 MEQ/15ML (10%) SOLN Take 20 mEq by mouth [name].    [provider]  traMADol (ULTRAM) 50 MG tablet Take 1 tablet (50 mg total) by mouth every 6 (six) hours as needed for moderate pain. 03/03/17   Ramonita Lab, MD      VITAL SIGNS:  Blood pressure (!) 117/97, pulse 79, temperature (!) 97.3 F (36.3 C), temperature source Oral, resp. rate 17, height 5\' 3"  (1.6 m), weight 101 lb 12.8 oz (46.2 kg), SpO2 100 %.  PHYSICAL EXAMINATION:  Physical Exam  GENERAL:  84 y.o.-year-old patient lying in the bed with no acute distress.  Severe malnutrition. EYES: Pupils equal, round, reactive to light and accommodation. No scleral icterus. Extraocular muscles intact.  HEENT: Head atraumatic, normocephalic. Oropharynx and nasopharynx clear.  NECK:  Supple, no jugular venous distention. No thyroid enlargement, no tenderness.  LUNGS: Normal breath sounds bilaterally, no wheezing, rales,rhonchi or crepitation. No use of accessory muscles of respiration.  CARDIOVASCULAR: S1, S2 normal. No murmurs, rubs, or gallops.  ABDOMEN: Soft, nontender, nondistended. Bowel sounds present. No organomegaly or mass.  EXTREMITIES: No pedal edema, cyanosis, or clubbing.  Bilateral lower extremity contracture, left foot in dressing. NEUROLOGIC: Unable to exam. PSYCHIATRIC: The patient is demented and does not follow command. SKIN: No obvious rash, lesion, or ulcer.   LABORATORY PANEL:   CBC Recent Labs  Lab 05/25/17 1300  WBC 5.8  HGB 10.6*  HCT 33.1*  PLT 517*   ------------------------------------------------------------------------------------------------------------------  Chemistries  Recent Labs  Lab 05/25/17 1300  NA 141  K 4.6  CL 109  CO2 19*  GLUCOSE 82  BUN 10  CREATININE 0.55  CALCIUM 9.5    ------------------------------------------------------------------------------------------------------------------  Cardiac Enzymes No results for input(s): TROPONINI in the last 168 hours. ------------------------------------------------------------------------------------------------------------------  RADIOLOGY:  No results found.    IMPRESSION AND PLAN:   Left foot ulcer infection and PVD. The patient is admitted to the medical floor. Start vancomycin and Zosyn pharmacy to dose. Follow-up vascular consult for BKA per Dr. 05/27/17.  Diabetes.  Start sliding scale and hold p.o. Medication. History of chronic A. fib.  Hold apixaban. CAD and PVD.  Hold apixaban for surgery. Hypertension.  Hold hypertension medication due to low side blood pressure. Severe malnutrition.  Dietitian consult. Dementia.  Aspiration and fall precaution.  All the records are reviewed and case discussed with ED provider. Management plans discussed with the patient, family and they are in agreement.  CODE STATUS:  DNR  TOTAL TIME TAKING CARE OF THIS PATIENT: 53 minutes.    Alberteen Spindle M.D on 05/25/2017 at 2:07 PM  Between 7am to 6pm - Pager - [phone]  After 6pm  go to [email or web address] - Social research officer, government  [facility] [place] [facility]  Office  [phone]  CC: Primary care physician; Angela Cox, MD   Note: This dictation was prepared with Dragon dictation along with smaller phrase technology. Any transcriptional errors that result from this process are unin

## 2017-05-25 NOTE — Progress Notes (Signed)
Pharmacy Antibiotic Note  [name] [name] is a 84 y.o. female admitted on 05/25/2017 with would infection left foot  Pharmacy has been consulted for Vancomycin and Zosyn dosing.  Patient withnonhealing left foot ulcer infection, need vascular surgery consult for possible BKA. Hx diabetes.  Plan: Will begin Vancomycin with stacked dosing. Vancomycin 750 IV every 24 hours.  Goal trough 15-20 mcg/mL. Zosyn 3.375g IV q8h (4 hour infusion).   Ke 0.036  T1/2 19.25  Vd 32.3   Scr 0.55 (used 0.80 in calculation) Will order Vancomycin trough prior to 5th dose on 1/19.   Height: 5\' 3"  (160 cm) Weight: 101 lb 12.8 oz (46.2 kg) IBW/kg (Calculated) : 52.4  Temp (24hrs), Avg:97.3 F (36.3 C), Min:97.3 F (36.3 C), Max:97.3 F (36.3 C)  Recent Labs  Lab 05/25/17 1300  WBC 5.8  CREATININE 0.55    Estimated Creatinine Clearance: 37.5 mL/min (by C-G formula based on SCr of 0.55 mg/dL).    No Known Allergies  Antimicrobials this admission: Zosyn 1/15 >>   Vanc  1/15 >>    Dose adjustments this admission:    Microbiology results:   BCx:     UCx:      Sputum:    1/15 MRSA PCR: neg  Thank you for allowing pharmacy to be a part of this patient's care.  Temima Kutsch A 05/25/2017 3:44 PM

## 2017-05-26 DIAGNOSIS — E43 Unspecified severe protein-calorie malnutrition: Secondary | ICD-10-CM

## 2017-05-26 LAB — CREATININE, SERUM
CREATININE: 0.5 mg/dL (ref 0.44–1.00)
GFR calc Af Amer: >60 mL/min (ref >60)
GFR calc non Af Amer: >60 mL/min (ref >60)

## 2017-05-26 LAB — GLUCOSE, CAPILLARY
Glucose-Capillary: 118 mg/dL — ABNORMAL HIGH (ref 65–99)
Glucose-Capillary: 167 mg/dL — ABNORMAL HIGH (ref 65–99)
Glucose-Capillary: 161 mg/dL — ABNORMAL HIGH (ref 65–99)
Glucose-Capillary: 159 mg/dL — ABNORMAL HIGH (ref 65–99)

## 2017-05-26 MED ORDER — PANTOPRAZOLE SODIUM 40 MG PO TBEC
40.0000 mg | DELAYED_RELEASE_TABLET | Freq: Every day | ORAL | Status: DC
  Administered 2017-05-26 – 2017-05-30 (×4): 40 mg via ORAL
  Filled 2017-05-26 (×4): qty 1

## 2017-05-26 MED ORDER — SODIUM CHLORIDE 0.9 % IV SOLN
INTRAVENOUS | Status: AC
  Administered 2017-05-26 (×3): via INTRAVENOUS

## 2017-05-26 MED ORDER — SODIUM CHLORIDE 0.9 % IV BOLUS (SEPSIS)
500.0000 mL | Freq: Once | INTRAVENOUS | Status: AC
  Administered 2017-05-26: 500 mL via INTRAVENOUS

## 2017-05-26 NOTE — Care Management (Signed)
vascular consult pending regarding possible amputation.  Patient from [facility].  RNCM following for discharge planning.

## 2017-05-26 NOTE — Progress Notes (Signed)
MD notified of pt low urine output. Orders for maintenance fluids since pt Is NPO. VSS Will continue to monitor.

## 2017-05-26 NOTE — Progress Notes (Addendum)
AMIRRA, HERLING ([id number]) Visit Report for 05/24/2017 Arrival Information Details Patient Name: [name] [name], [name] [name]. Date of Service: 05/24/2017 1:30 PM Medical Record Number: [id number] Patient Account Number: [id number] Date of Birth/Sex: 1929/07/26 (84 y.o. Female) Treating RN: Ashok Cordia, Debi Primary Care Masud Holub: Karlene Einstein Other Clinician: Referring [place] Delauder: Treating Rochanda Harpham/Extender: Linwood Dibbles, HOYT Weeks in Treatment: 3 Visit Information History Since Last Visit All ordered tests and consults were completed: No Patient Arrived: Wheel Chair Added or deleted any medications: No Arrival Time: 13:33 Any new allergies or adverse reactions: No Accompanied By: caregiver Had a fall or experienced change in No activities of daily living that may affect Transfer Assistance: Michiel Sites Lift risk of falls: Patient Identification Verified: Yes Signs or symptoms of abuse/neglect since last visito No Secondary Verification Process Completed: Yes Hospitalized since last visit: No Patient Requires Transmission-Based No Has Dressing in Place as Prescribed: Yes Precautions: Pain Present Now: No Patient Has Alerts: No Electronic Signature(s) Signed: 05/24/2017 5:03:00 PM By: Alejandro Mulling Entered By: Alejandro Mulling on 05/24/2017 13:36:19 Fake, [name] ([id number]) -------------------------------------------------------------------------------- Clinic Level of Care Assessment Details Patient Name: [name] [name]. Date of Service: 05/24/2017 1:30 PM Medical Record Number: [id number] Patient Account Number: [id number] Date of Birth/Sex: 04-16-1930 (84 y.o. Female) Treating RN: Ashok Cordia, Debi Primary Care Kristofer Schaffert: Karlene Einstein Other Clinician: Referring Zarek Relph: Treating Solmon Bohr/Extender: Linwood Dibbles, HOYT Weeks in Treatment: 3 Clinic Level of Care Assessment Items TOOL 4 Quantity Score X - Use when only an EandM is performed on FOLLOW-UP visit 1 0 ASSESSMENTS - Nursing  Assessment / Reassessment X - Reassessment of Co-morbidities (includes updates in patient status) 1 10 X- 1 5 Reassessment of Adherence to Treatment Plan ASSESSMENTS - Wound and Skin Assessment / Reassessment []  - Simple Wound Assessment / Reassessment - one wound 0 X- 4 5 Complex Wound Assessment / Reassessment - multiple wounds []  - 0 Dermatologic / Skin Assessment (not related to wound area) ASSESSMENTS - Focused Assessment []  - Circumferential Edema Measurements - multi extremities 0 []  - 0 Nutritional Assessment / Counseling / Intervention []  - 0 Lower Extremity Assessment (monofilament, tuning fork, pulses) []  - 0 Peripheral Arterial Disease Assessment (using hand held doppler) ASSESSMENTS - Ostomy and/or Continence Assessment and Care []  - Incontinence Assessment and Management 0 []  - 0 Ostomy Care Assessment and Management (repouching, etc.) PROCESS - Coordination of Care []  - Simple Patient / Family Education for ongoing care 0 X- 1 20 Complex (extensive) Patient / Family Education for ongoing care X- 1 10 Staff obtains Chiropractor, Records, Test Results / Process Orders X- 1 10 Staff telephones HHA, Nursing Homes / Clarify orders / etc []  - 0 Routine Transfer to another Facility (non-emergent condition) []  - 0 Routine Hospital Admission (non-emergent condition) []  - 0 New Admissions / Manufacturing engineer / Ordering NPWT, Apligraf, etc. []  - 0 Emergency Hospital Admission (emergent condition) X- 1 10 Simple Discharge Coordination Dorado, RAELENE TREW ([id number]) []  - 0 Complex (extensive) Discharge Coordination PROCESS - Special Needs []  - Pediatric / Minor Patient Management 0 []  - 0 Isolation Patient Management []  - 0 Hearing / Language / Visual special needs []  - 0 Assessment of Community assistance (transportation, D/C planning, etc.) []  - 0 Additional assistance / Altered mentation []  - 0 Support Surface(s) Assessment (bed, cushion, seat,  etc.) INTERVENTIONS - Wound Cleansing / Measurement []  - Simple Wound Cleansing - one wound 0 X- 4 5 Complex Wound Cleansing - multiple wounds X- 1 5 Wound Imaging (photographs - any number of wounds) []  -  0 Wound Tracing (instead of photographs) []  - 0 Simple Wound Measurement - one wound X- 4 5 Complex Wound Measurement - multiple wounds INTERVENTIONS - Wound Dressings []  - Small Wound Dressing one or multiple wounds 0 X- 1 15 Medium Wound Dressing one or multiple wounds []  - 0 Large Wound Dressing one or multiple wounds X- 1 5 Application of Medications - topical []  - 0 Application of Medications - injection INTERVENTIONS - Miscellaneous []  - External ear exam 0 X- 1 5 Specimen Collection (cultures, biopsies, blood, body fluids, etc.) X- 1 5 Specimen(s) / Culture(s) sent or taken to Lab for analysis X- 1 10 Patient Transfer (multiple staff / Michiel Sites Lift / Similar devices) []  - 0 Simple Staple / Suture removal (25 or less) []  - 0 Complex Staple / Suture removal (26 or more) []  - 0 Hypo / Hyperglycemic Management (close monitor of Blood Glucose) []  - 0 Ankle / Brachial Index (ABI) - do not check if billed separately X- 1 5 Vital Signs Offenberger, Tyonna M. ([id number]) Has the patient been seen at the hospital within the last three years: Yes Total Score: 175 Level Of Care: New/Established - Level 5 Electronic Signature(s) Signed: 05/24/2017 5:03:00 PM By: Alejandro Mulling Entered By: Alejandro Mulling on 05/24/2017 16:28:41 Yacoub, [name] ([id number]) -------------------------------------------------------------------------------- Complex / Palliative Patient Assessment Details Patient Name: [name] [name]. Date of Service: 05/24/2017 1:30 PM Medical Record Number: [id number] Patient Account Number: [id number] Date of Birth/Sex: [birth year] (84 y.o. Female) Treating RN: Huel Coventry Primary Care Kalea Perine: Karlene Einstein Other Clinician: Referring Carmela Piechowski: Treating  Dawnita Molner/Extender: Linwood Dibbles, HOYT Weeks in Treatment: 3 Palliative Management Criteria Patient has a living will that specifies no extraordinary measures and advanced wound treatment would expose the patient to those extraordinary interventions. Patient has a terminal condition (life expectancy of less than 6 months) and advanced wound care would negatively impact the patient's quality of life. not persueing amputation Complex Wound Management Criteria Care Approach Wound Care Plan: Palliative Wound Management Electronic Signature(s) Signed: 06/04/2017 11:21:01 AM By: Elliot Gurney, BSN, RN, CWS, Kim RN, BSN Signed: 06/05/2017 1:04:00 PM By: Lenda Kelp PA-C Entered By: Elliot Gurney, BSN, RN, CWS, Kim on 06/04/2017 11:21:01 Maulding, [name] ([id number]) -------------------------------------------------------------------------------- Encounter Discharge Information Details Patient Name: [name] [name]. Date of Service: 05/24/2017 1:30 PM Medical Record Number: [id number] Patient Account Number: [id number] Date of Birth/Sex: 1929-12-20 (84 y.o. Female) Treating RN: Ashok Cordia, Debi Primary Care Melquisedec Journey: Karlene Einstein Other Clinician: Referring Cadince Hilscher: Treating Marnisha Stampley/Extender: Linwood Dibbles, HOYT Weeks in Treatment: 3 Encounter Discharge Information Items Discharge Pain Level: 0 Discharge Condition: Stable Ambulatory Status: Wheelchair Discharge Destination: Nursing Home Transportation: Private Auto Accompanied By: caregiver Schedule Follow-up Appointment: Yes Medication Reconciliation completed and No provided to Patient/Care Lorraina Spring: Provided on Clinical Summary of Care: 05/24/2017 Form Type Recipient Paper Patient LG Electronic Signature(s) Signed: 05/26/2017 11:52:14 AM By: Gwenlyn Perking Entered By: Gwenlyn Perking on 05/24/2017 14:19:50 Claar, [name] ([id number]) -------------------------------------------------------------------------------- Lower Extremity Assessment  Details Patient Name: [name] [name]. Date of Service: 05/24/2017 1:30 PM Medical Record Number: [id number] Patient Account Number: [id number] Date of Birth/Sex: 1929/11/03 (84 y.o. Female) Treating RN: Ashok Cordia, Debi Primary Care Zebulon Gantt: Karlene Einstein Other Clinician: Referring Saraann Enneking: Treating Javen Hinderliter/Extender: Linwood Dibbles, HOYT Weeks in Treatment: 3 Vascular Assessment Pulses: Dorsalis Pedis Palpable: [Left:No] Doppler Audible: [Left:Yes] Posterior Tibial Extremity colors, hair growth, and conditions: Extremity Color: [Left:Hyperpigmented] Temperature of Extremity: [Left:Warm] Capillary Refill: [Left:> 3 seconds] Toe Nail Assessment Left: Right: Thick: Yes Discolored: Yes Deformed: Yes Improper Length and  Hygiene: Yes Electronic Signature(s) Signed: 05/24/2017 5:03:00 PM By: Alejandro Mulling Entered By: Alejandro Mulling on 05/24/2017 13:55:00 Hast, [name] ([id number]) -------------------------------------------------------------------------------- Multi Wound Chart Details Patient Name: [name] [name]. Date of Service: 05/24/2017 1:30 PM Medical Record Number: [id number] Patient Account Number: [id number] Date of Birth/Sex: 05/23/1929 (84 y.o. 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] [3:Arterial Insufficiency Ulcer] [4:Arterial Insufficiency Ulcer] Date Acquired: [2:04/02/2017] [3:02/08/2017]  [4:04/12/2017] Weeks of Treatment: [2:3] [3:3] [4:3] Wound Status: [2:Open] [3:Open] [4:Open] Pending Amputation on [2:Yes] [3:Yes] [4:Yes] Presentation: Measurements L x W x D [2:0.8x0.9x0.2] [3:2x5.4x0.1] [4:6.5x3.9x0.6] (cm) Area (cm) : [2:0.565] [3:8.482] [4:19.91] Volume (cm) : [2:0.113] [3:0.848] [4:11.946] % Reduction in Area: [2:-188.30%] [3:36.50%] [4:36.60%] % Reduction in Volume: [2:-91.50%] [3:36.50%] [4:-280.20%] Classification: [2:Full Thickness With Exposed Support Structures] [3:Unclassifiable] [4:Unclassifiable] Periwound Skin Texture: [2:No Abnormalities Noted] [3:No Abnormalities Noted] [4:No Abnormalities Noted] Periwound Skin Moisture: [2:No Abnormalities Noted] [3:No Abnormalities Noted] [4:No Abnormalities Noted] Periwound Skin Color: [2:No Abnormalities Noted No] [3:No Abnormalities Noted No] [4:No Abnormalities Noted No] Wound Number: 5 N/A N/A Photos: No Photos N/A N/A Wound Location: Left Achilles N/A N/A Wounding Event: Gradually Appeared N/A N/A Primary Etiology: Arterial Insufficiency Ulcer N/A N/A Date Acquired: 04/12/2017 N/A N/A Weeks of Treatment: 3 N/A N/A Wound Status: Open N/A N/A Pending Amputation on Yes N/A N/A Presentation: Measurements L x W x D 0.3x0.5x0.2 N/A N/A (cm) Area (cm) : 0.118 N/A N/A Volume (cm) : 0.024 N/A N/A Chaudhari, Edeline M. ([id number]) % Reduction in Area: 57.10% N/A N/A % Reduction in Volume: 82.50% N/A N/A Classification: Full Thickness With Exposed N/A N/A Support Structures Periwound Skin Texture: No Abnormalities Noted N/A N/A Periwound Skin Moisture: No Abnormalities Noted N/A N/A Periwound Skin Color: No Abnormalities Noted N/A N/A Tenderness on Palpation: No N/A N/A Treatment Notes Electronic Signature(s) Signed: 05/24/2017 5:03:00 PM By: Alejandro Mulling Entered By: Alejandro Mulling on 05/24/2017 13:58:10 Aina, [name]  ([id number]) -------------------------------------------------------------------------------- Multi-Disciplinary Care Plan Details Patient Name: [name] [name]. Date of Service: 05/24/2017 1:30 PM Medical Record Number: [id number] Patient Account Number: [id number] Date of Birth/Sex: 1930-02-18 (84 y.o. Female) Treating RN: Ashok Cordia, Debi Primary Care Zeriyah Wain: Karlene Einstein Other Clinician: Referring Tuwanna Krausz: Treating Kriss Perleberg/Extender: Skeet Simmer in Treatment: 3 Active Inactive Electronic Signature(s) Signed: 06/08/2017 1:12:43 PM By: Elliot Gurney, BSN, RN, CWS, Kim RN, BSN Signed: 06/29/17 4:41:38 PM By: Alejandro Mulling Previous Signature: 05/24/2017 5:03:00 PM Version By: Alejandro Mulling Entered By: Elliot Gurney BSN, RN, CWS, Kim on 06/08/2017 13:12:43 Kirkendall, [name] ([id number]) -------------------------------------------------------------------------------- Pain Assessment Details Patient Name: [name] [name]. Date of Service: 05/24/2017 1:30 PM Medical Record Number: [id number] Patient Account Number: [id number] Date of Birth/Sex: Sep 22, 1929 (84 y.o. Female) Treating RN: Ashok Cordia, Debi Primary Care Emery Dupuy: Karlene Einstein Other Clinician: Referring Charvi Gammage: Treating Jourdin Gens/Extender: Linwood Dibbles, HOYT Weeks in Treatment: 3 Active Problems Location of Pain Severity and Description of Pain Patient Has Paino No Site Locations Pain Management and Medication Current Pain Management: Electronic Signature(s) Signed: 05/24/2017 5:03:00 PM By: Alejandro Mulling Entered By: Alejandro Mulling on 05/24/2017 13:36:29 Boord, [name] ([id number]) -------------------------------------------------------------------------------- Patient/Caregiver Education Details Patient Name: [name] [name]. Date of  Service: 05/24/2017 1:30 PM Medical Record Number: [id number] Patient Account Number: [id number] Date of Birth/Gender: 07-31-1929 (84 y.o. Female) Treating RN: Ashok Cordia, Debi Primary Care  Physician: Karlene Einstein Other Clinician: Referring Physician: Treating Physician/Extender: Skeet Simmer in Treatment: 3 Education Assessment Education Provided To: Patient and Caregiver Education Topics Provided Wound/Skin Impairment: Handouts: Caring for Your Ulcer, Other: change dressing as ordered Methods: Demonstration, Explain/Verbal Responses: State content correctly Electronic Signature(s) Signed: 05/24/2017 5:03:00 PM By: Alejandro Mulling Entered By: Alejandro Mulling on 05/24/2017 13:59:01 Lenart, [name] ([id number]) -------------------------------------------------------------------------------- Wound Assessment Details Patient Name: [name] [name]. Date of Service: 05/24/2017 1:30 PM Medical Record Number: [id number] Patient Account Number: [id number] Date of Birth/Sex: July 27, 1929 (84 y.o. Female) Treating RN: Ashok Cordia, Debi Primary Care Latavion Halls: Karlene Einstein Other Clinician: Referring Hance Caspers: Treating Reagen Goates/Extender: Linwood Dibbles, HOYT Weeks in Treatment: 3 Wound Status Wound Number: 2 Primary Etiology: Arterial Insufficiency Ulcer Wound Location: Left Toe Great Wound Status: Open Wounding Event: Gradually Appeared Date Acquired: 04/02/2017 Weeks Of Treatment: 3 Clustered Wound: No Pending Amputation On Presentation Photos Photo Uploaded By: Alejandro Mulling on 05/25/2017 14:40:10 Wound Measurements Length: (cm) 0.8 Width: (cm) 0.9 Depth: (cm) 0.2 Area: (cm) 0.565 Volume: (cm) 0.113 % Reduction in Area: -188.3% % Reduction in Volume: -91.5% Wound Description Full Thickness With Exposed Support Classification: Structures Periwound Skin Texture Texture Color No Abnormalities Noted: No No Abnormalities Noted: No Moisture No Abnormalities Noted: No Electronic Signature(s) Signed: 05/24/2017 5:03:00 PM By: Alejandro Mulling Entered By: Alejandro Mulling on 05/24/2017 13:50:30 Kuipers, [name]  ([id number]) -------------------------------------------------------------------------------- Wound Assessment Details Patient Name: [name] [name]. Date of Service: 05/24/2017 1:30 PM Medical Record Number: [id number] Patient Account Number: [id number] Date of Birth/Sex: 1929-08-12 (84 y.o. Female) Treating RN: Ashok Cordia, Debi Primary Care Deontaye Civello: Karlene Einstein Other Clinician: Referring Khala Tarte: Treating Madiha Bambrick/Extender: Linwood Dibbles, HOYT Weeks in Treatment: 3 Wound Status Wound Number: 3 Primary Etiology: Arterial Insufficiency Ulcer Wound Location: Left, Lateral Foot Wound Status: Open Wounding Event: Gradually Appeared Date Acquired: 02/08/2017 Weeks Of Treatment: 3 Clustered Wound: No Pending Amputation On Presentation Photos Photo Uploaded By: Alejandro Mulling on 05/25/2017 14:40:39 Wound Measurements Length: (cm) 2 Width: (cm) 5.4 Depth: (cm) 0.1 Area: (cm) 8.482 Volume: (cm) 0.848 % Reduction in Area: 36.5% % Reduction in Volume: 36.5% Wound Description Classification: Unclassifiable Periwound Skin Texture Texture Color No Abnormalities Noted: No No Abnormalities Noted: No Moisture No Abnormalities Noted: No Electronic Signature(s) Signed: 05/24/2017 5:03:00 PM By: Alejandro Mulling Entered By: Alejandro Mulling on 05/24/2017 13:50:31 Esau, [name] ([id number]) -------------------------------------------------------------------------------- Wound Assessment Details Patient Name: [name] [name]. Date of Service: 05/24/2017 1:30 PM Medical Record Number: [id number] Patient Account Number: [id number] Date of Birth/Sex: Feb 19, 1930 (84 y.o. Female) Treating RN: Ashok Cordia, Debi Primary Care Myrl Lazarus: Karlene Einstein Other Clinician: Referring Maurice Fotheringham: Treating Deontae Robson/Extender: Linwood Dibbles, HOYT Weeks in Treatment: 3 Wound Status Wound Number: 4 Primary Etiology: Arterial Insufficiency Ulcer Wound Location: Left, Medial Foot Wound Status: Open Wounding  Event: Gradually Appeared Date Acquired: 04/12/2017 Weeks Of Treatment: 3 Clustered Wound: No Pending Amputation On Presentation Photos Photo Uploaded By: Alejandro Mulling on 05/25/2017 14:40:39 Wound Measurements Length: (cm) 6.5 Width: (cm) 3.9 Depth: (cm) 0.6 Area: (cm) 19.91 Volume: (cm) 11.946 % Reduction in Area: 36.6% % Reduction in Volume: -280.2% Wound Description Classification: Unclassifiable Periwound Skin Texture Texture Color No Abnormalities Noted: No No Abnormalities Noted: No Moisture No Abnormalities Noted: No Electronic Signature(s) Signed: 05/24/2017 5:03:00 PM By: Alejandro Mulling Entered By: Alejandro Mulling on 05/24/2017 13:51:42 Dahan, [name] ([id number]) -------------------------------------------------------------------------------- Wound Assessment  Details Patient Name: KIMBERLYN, QUIOCHO. Date of Service: 05/24/2017 1:30 PM Medical Record Number: [id number] Patient Account Number: [id number] Date of Birth/Sex: 01-Jul-1929 (84 y.o. Female) Treating RN: Ashok Cordia, Debi Primary Care Jaquawn Saffran: Karlene Einstein Other Clinician: Referring Shiro Ellerman: Treating Mirza Kidney/Extender: Linwood Dibbles, HOYT Weeks in Treatment: 3 Wound Status Wound Number: 5 Primary Etiology: Arterial Insufficiency Ulcer Wound Location: Left Achilles Wound Status: Open Wounding Event: Gradually Appeared Date Acquired: 04/12/2017 Weeks Of Treatment: 3 Clustered Wound: No Pending Amputation On Presentation Photos Photo Uploaded By: Alejandro Mulling on 05/25/2017 14:41:16 Wound Measurements Length: (cm) 0.3 Width: (cm) 0.5 Depth: (cm) 0.2 Area: (cm) 0.118 Volume: (cm) 0.024 % Reduction in Area: 57.1% % Reduction in Volume: 82.5% Wound Description Full Thickness With Exposed Support Classification: Structures Periwound Skin Texture Texture Color No Abnormalities Noted: No No Abnormalities Noted: No Moisture No Abnormalities Noted: No Electronic Signature(s) Signed:  05/24/2017 5:03:00 PM By: Alejandro Mulling Entered By: Alejandro Mulling on 05/24/2017 13:53:03 Cherian, [name] ([id number]) -------------------------------------------------------------------------------- Vitals Details Patient Name: [name] [name]. Date of Service: 05/24/2017 1:30 PM Medical Record Number: [id number] Patient Account Number: [id number] Date of Birth/Sex: 01/18/1930 (84 y.o. Female) Treating RN: Ashok Cordia, Debi Primary Care Cassundra Mckeever: Karlene Einstein Other Clinician: Izetta Dakin Referring Reshad Saab: Treating Kaidyn Javid/Extender: Linwood Dibbles, HOYT Weeks in Treatment: 3 Vital Signs Time Taken: 13:36 Temperature (F): 97.5 Height (in): 62 Pulse (bpm): 80 Weight (lbs): 140 Respiratory Rate (breaths/min): 16 Body Mass Index (BMI): 25.6 Blood Pressure (mmHg): 82/47 Reference Range: 80 - 120 mg / dl Notes BP taken manually and it was 82/60, taken again manually by another staff member Kennon Rounds and it was 86/66. Leonard Schwartz, [place] made aware. Electronic Signature(s) Signed: 05/24/2017 5:03:00 PM By: Alejandro Mulling Entered By: Alejandro Mulling on 05/24/2017 13:41:57

## 2017-05-26 NOTE — Progress Notes (Signed)
MD ordered for In and Out cath.

## 2017-05-26 NOTE — Progress Notes (Signed)
[facility] - [place] at [facility]   PATIENT NAME: [name] [name]    MR#:  [id number]  DATE OF BIRTH:  [birth year]  SUBJECTIVE:   Pt. here Due to left diabetic foot ulcer.  Patient is awaiting vascular surgery input regarding possible amputation. Patient herself has dementia and is confused.  REVIEW OF SYSTEMS:    Review of Systems  Unable to perform ROS: Dementia    Nutrition: Dysphagia II Tolerating Diet: Yes Tolerating PT: Bedbound  DRUG ALLERGIES:  No Known Allergies  VITALS:  Blood pressure 123/88, pulse 96, temperature 98.6 F (37 C), temperature source Axillary, resp. rate 17, height 5\' 3"  (1.6 m), weight 46.2 kg (101 lb 12.8 oz), SpO2 99 %.  PHYSICAL EXAMINATION:   Physical Exam  GENERAL:  84 year-old then demented patient lying in bed in a contracted Position in NAD.  EYES: Pupils equal, round, reactive to light and accommodation. No scleral icterus. Extraocular muscles intact.  HEENT: Head atraumatic, normocephalic. Oropharynx and nasopharynx clear.  NECK:  Supple, no jugular venous distention. No thyroid enlargement, no tenderness.  LUNGS: Normal breath sounds bilaterally, no wheezing, rales, rhonchi. No use of accessory muscles of respiration.  CARDIOVASCULAR: S1, S2 normal. No murmurs, rubs, or gallops.  ABDOMEN: Soft, nontender, nondistended. Bowel sounds present. No organomegaly or mass.  EXTREMITIES: No cyanosis, clubbing or edema b/l.    NEUROLOGIC: Cranial nerves II through XII are intact. No focal Motor or sensory deficits b/l.  Globally weak PSYCHIATRIC: The patient is alert and oriented x 1.  SKIN: No obvious rash, lesion, Left diabetic foot ulcer   LABORATORY PANEL:   CBC Recent Labs  Lab 05/25/17 1300  WBC 5.8  HGB 10.6*  HCT 33.1*  PLT 517*   ------------------------------------------------------------------------------------------------------------------  Chemistries  Recent Labs  Lab 05/25/17 1300 05/26/17 0403   NA 141  --   K 4.6  --   CL 109  --   CO2 19*  --   GLUCOSE 82  --   BUN 10  --   CREATININE 0.55 0.50  CALCIUM 9.5  --    ------------------------------------------------------------------------------------------------------------------  Cardiac Enzymes No results for input(s): TROPONINI in the last 168 hours. ------------------------------------------------------------------------------------------------------------------  RADIOLOGY:  No results found.   ASSESSMENT AND PLAN:   84 year old female with past medical history of dementia, hypertension, hyperlipidemia, diabetes, COPD, CHF who presented to the hospital due to infected left diabetic foot ulcer.  1. Infected left diabetic foot ulcer-patient was admitted directly from the podiatrist's office. -Continue IV vancomycin, Zosyn. Await vascular surgery input this patient may benefit from a below the knee amputation. -Currently afebrile and hemodynamically stable.  2. Diabetes type 2 without complication-continue sliding scale insulin. - BS stable.   3. Hx of chronic atrial fibrillation-rate controlled. Hold Eliquis as patient may need surgery.  4. HTN - anti-HTN on hold as pt. Is normotensive  5. COPD - no acute exacerbation.  - PRN duonebs.   6. GERD - cont. Protonix.   All the records are reviewed and case discussed with Care Management/Social Worker. Management plans discussed with the patient, family and they are in agreement.  CODE STATUS: DNR  DVT Prophylaxis: Ted's & SCD's.   TOTAL TIME TAKING CARE OF THIS PATIENT: 30 minutes.   POSSIBLE D/C IN 1-2 DAYS, DEPENDING ON CLINICAL CONDITION.   84 M.D on 05/26/2017 at 1:39 PM  Between 7am to 6pm - Pager - [phone]  After 6pm go to [email or web address] - 10-08-1994  Hospitalists  Office  [phone]  CC: Primary care physician; Angela Cox, MD

## 2017-05-26 NOTE — Consult Note (Addendum)
Full note to follow. In short, debilitated contracted patient with left foot infection Only option for treatment will be AKA on the left or palliative care No family in room. Patient does not provide history If amputation desired by family, will plan to schedule later in the week          [facility] Vascular Consult Note  MRN : [id number]  [name] [name] is a 84 y.o. (1929/06/12) female who presents with chief complaint of No chief complaint on file. Marland Kitchen  History of Present Illness: I am asked by Dr. Imogene Burn to see the patient regarding nonhealing lower extremity ulceration and infection.  The patient has profound dementia and cannot provide any history.  This is obtained from the previous medical record.  She was seen about 2-3 months ago and it was discussed that the only option for treatment of her infection would be a major amputation.  She has profound flexion contractures and cannot bend her knees at all.  The ulcerations predominantly on the left foot.  Admitted with low-grade fever and drainage from the wound.  Current Facility-Administered Medications  Medication Dose Route Frequency Provider Last Rate Last Dose  . acetaminophen (TYLENOL) tablet 650 mg  650 mg Oral Q6H PRN Enedina Finner, MD   650 mg at 05/27/17 0910   Or  . acetaminophen (TYLENOL) suppository 650 mg  650 mg Rectal Q6H PRN Enedina Finner, MD      . feeding supplement (ENSURE ENLIVE) (ENSURE ENLIVE) liquid 237 mL  237 mL Oral TID BM Shaune Pollack, MD   237 mL at 05/27/17 0912  . insulin aspart (novoLOG) injection 0-9 Units  0-9 Units Subcutaneous TID WC Enedina Finner, MD   1 Units at 05/27/17 0810  . morphine 2 MG/ML injection 2 mg  2 mg Intravenous Q4H PRN Shaune Pollack, MD      . multivitamin with minerals tablet 1 tablet  1 tablet Oral Daily Shaune Pollack, MD   1 tablet at 05/26/17 1727  . multivitamin-lutein (OCUVITE-LUTEIN) capsule 1 capsule  1 capsule Oral Daily Shaune Pollack, MD   1 capsule at 05/26/17  1511  . ondansetron (ZOFRAN) tablet 4 mg  4 mg Oral Q6H PRN Enedina Finner, MD       Or  . ondansetron [facility]) injection 4 mg  4 mg Intravenous Q6H PRN Enedina Finner, MD      . oxyCODONE-acetaminophen (PERCOCET/ROXICET) 5-325 MG per tablet 1 tablet  1 tablet Oral Q6H PRN Shaune Pollack, MD   1 tablet at 05/25/17 2244  . pantoprazole (PROTONIX) EC tablet 40 mg  40 mg Oral Daily Houston Siren, MD   40 mg at 05/27/17 0910  . piperacillin-tazobactam (ZOSYN) IVPB 3.375 g  3.375 g Intravenous Hazle Quant, MD 12.5 mL/hr at 05/27/17 0811 3.375 g at 05/27/17 0811  . polyethylene glycol (MIRALAX / GLYCOLAX) packet 17 g  17 g Oral Daily PRN Enedina Finner, MD      . senna (SENOKOT) tablet 8.6 mg  1 tablet Oral BID Enedina Finner, MD   8.6 mg at 05/26/17 2250  . vancomycin (VANCOCIN) IVPB 750 mg/150 ml premix  750 mg Intravenous Q24H Enedina Finner, MD 150 mL/hr at 05/27/17 0811 750 mg at 05/27/17 0811  . vitamin C (ASCORBIC ACID) tablet 250 mg  250 mg Oral BID Shaune Pollack, MD   250 mg at 05/27/17 0518    Past Medical History:  Diagnosis Date  . A-fib (HCC)   . Allergic rhinitis   .  Alzheimer disease   . CAD (coronary artery disease)   . Cardiomyopathy (HCC)   . CHF (congestive heart failure) (HCC)   . COPD (chronic obstructive pulmonary disease) (HCC)   . Dementia   . Diabetes mellitus without complication (HCC)   . Diverticulosis   . Eczema   . Hyperlipemia   . Hypertension   . Urinary incontinence     Past Surgical History:  Procedure Laterality Date  . AMPUTATION TOE Left 02/25/2017   Procedure: AMPUTATION TOE-LEFT 2ND TOE;  Surgeon: Recardo Evangelist, DPM;  Location: [facility] ORS;  Service: Podiatry;  Laterality: Left;  . HYSTEROTOMY    . none    . PACEMAKER PLACEMENT      Social History Social History   Tobacco Use  . Smoking status: Former Games developer  . Smokeless tobacco: Former Engineer, water Use Topics  . Alcohol use: No  . Drug use: No    Family History Family History  Problem  Relation Age of Onset  . Hypertension Father   No reported history of bleeding disorders, clotting disorders, or aneurysms. Patient unable to provide  No Known Allergies   REVIEW OF SYSTEMS (Negative unless checked) Patient unable to provide review of systems given her profound dementia and ability to provide any history.  No family in the room at the time that I saw her  Physical Examination  Vitals:   05/26/17 0740 05/26/17 1508 05/26/17 2003 05/27/17 0539  BP: 123/88 118/65 (!) 160/88 131/68  Pulse: 96 (!) 111 (!) 106 (!) 101  Resp: 17  18   Temp: 98.6 F (37 C) 98.4 F (36.9 C) 98.1 F (36.7 C) 98.6 F (37 C)  TempSrc: Axillary  Oral Oral  SpO2: 99% 98% 100% 98%  Weight:      Height:       Body mass index is 18.03 kg/m. Gen: Cachectic and contractured African-American female not in apparent distress Head: [place]/AT, + temporalis wasting. Ear/Nose/Throat: Hearing grossly intact, nares w/o erythema or drainage, oropharynx w/o Erythema/Exudate Eyes: Sclera non-icteric, conjunctiva clear Neck: Trachea midline.  No JVD.  Pulmonary:  Good air movement, respirations not labored, equal bilaterally.  Cardiac: Irregularly irregular Vascular:  Vessel Right Left  Radial Palpable Palpable                          PT  not palpable  not palpable  DP  not palpable  not palpable    Musculoskeletal: Profound flexion contractures of both lower extremities.  Knees are completely bent.  Patient is basically in the fetal position. Neurologic: Rigidity and contractures as above.  Not awake or alert.  Responds somewhat to pain Psychiatric: Judgment and insight are extremely poor.  Patient unable to provide history Dermatologic: Open wound on the left foot with some purulent drainage       CBC Lab Results  Component Value Date   WBC 5.8 05/25/2017   HGB 10.6 (L) 05/25/2017   HCT 33.1 (L) 05/25/2017   MCV 91.8 05/25/2017   PLT 517 (H) 05/25/2017    BMET    Component  Value Date/Time   NA 141 05/27/2017 0541   K 3.7 05/27/2017 0541   CL 109 05/27/2017 0541   CO2 23 05/27/2017 0541   GLUCOSE 146 (H) 05/27/2017 0541   BUN 8 05/27/2017 0541   CREATININE 0.48 05/27/2017 0541   CALCIUM 8.9 05/27/2017 0541   GFRNONAA >60 05/27/2017 0541   GFRAA >60 05/27/2017 0541  Estimated Creatinine Clearance: 37.5 mL/min (by C-G formula based on SCr of 0.48 mg/dL).  COAG No results found for: INR, PROTIME  Radiology Dg Ankle Complete Left  Result Date: 05/06/2017 CLINICAL DATA:  Wounds EXAM: LEFT ANKLE COMPLETE - 3+ VIEW COMPARISON:  02/19/2017 FINDINGS: Osteopenia.  No fracture.  No soft tissue gas.  No bone destruction. IMPRESSION: Diffuse osteopenia.  No definite acute osseous abnormality. Electronically Signed   By: Jasmine Pang M.D.   On: 05/06/2017 19:44   Dg Foot Complete Left  Result Date: 05/06/2017 CLINICAL DATA:  Osteomyelitis of the foot EXAM: LEFT FOOT - COMPLETE 3+ VIEW COMPARISON:  02/19/2017 FINDINGS: Diffuse osteopenia. No gross fracture. Limited evaluation of the distal digits due to flexion and extension deformities. No soft tissue gas. IMPRESSION: Osteopenia with suboptimal evaluation of the distal digits due to positioning. No definite acute osseous abnormality Electronically Signed   By: Jasmine Pang M.D.   On: 05/06/2017 19:43      Assessment/Plan 1.  Left foot ulceration with infection.  Patient is not a candidate for revascularization.  At this point, really her only options for treatment of the wound would be palliative care with hospice versus left above-knee amputation.  At some point, it is highly likely that she would develop similar ulcerations on the right foot from pressure necrosis and require above-knee amputation in the future on the right side as well.  Below-knee amputation is not an option with her flexion contractures of the knees.  This is a very difficult and unfortunate situation. 2.  Alzheimer's dementia.  Appears to be  quite profound.  Consideration for hospice and palliative care should be given 3.  Diabetes.  Stable on outpatient medications and blood glucose control important in reducing the progression of atherosclerotic disease. Also, involved in wound healing. On appropriate medications. 4.  HTN. Stable on outpatient medications and blood pressure control important in reducing the progression of atherosclerotic disease. On appropriate oral medications.    Festus Barren, MD  05/27/2017 9:14 AM    This note was created with Dragon medical transcription system.  Any error is purely unintentional

## 2017-05-26 NOTE — Progress Notes (Signed)
MD aware of low urine output. Ordered to give 1/2L bolus. If still no urine, give another 1/2L bolus.

## 2017-05-27 ENCOUNTER — Telehealth (INDEPENDENT_AMBULATORY_CARE_PROVIDER_SITE_OTHER): Payer: Self-pay

## 2017-05-27 DIAGNOSIS — I70248 Atherosclerosis of native arteries of left leg with ulceration of other part of lower left leg: Secondary | ICD-10-CM

## 2017-05-27 DIAGNOSIS — I10 Essential (primary) hypertension: Secondary | ICD-10-CM

## 2017-05-27 DIAGNOSIS — E119 Type 2 diabetes mellitus without complications: Secondary | ICD-10-CM

## 2017-05-27 DIAGNOSIS — I96 Gangrene, not elsewhere classified: Secondary | ICD-10-CM

## 2017-05-27 LAB — GLUCOSE, CAPILLARY
GLUCOSE-CAPILLARY: 125 mg/dL — AB (ref 65–99)
Glucose-Capillary: 135 mg/dL — ABNORMAL HIGH (ref 65–99)
Glucose-Capillary: 151 mg/dL — ABNORMAL HIGH (ref 65–99)
Glucose-Capillary: 95 mg/dL (ref 65–99)

## 2017-05-27 LAB — BASIC METABOLIC PANEL
ANION GAP: 9 (ref 5–15)
BUN: 8 mg/dL (ref 6–20)
CALCIUM: 8.9 mg/dL (ref 8.9–10.3)
CO2: 23 mmol/L (ref 22–32)
CREATININE: 0.48 mg/dL (ref 0.44–1.00)
Chloride: 109 mmol/L (ref 101–111)
GFR calc Af Amer: >60 mL/min (ref >60)
GFR calc non Af Amer: >60 mL/min (ref >60)
GLUCOSE: 146 mg/dL — AB (ref 65–99)
Potassium: 3.7 mmol/L (ref 3.5–5.1)
Sodium: 141 mmol/L (ref 135–145)

## 2017-05-27 LAB — AEROBIC CULTURE W GRAM STAIN (SUPERFICIAL SPECIMEN): Gram Stain: NONE SEEN

## 2017-05-27 LAB — AEROBIC CULTURE  (SUPERFICIAL SPECIMEN)

## 2017-05-27 MED ORDER — HEPARIN SODIUM (PORCINE) 5000 UNIT/ML IJ SOLN
5000.0000 [IU] | Freq: Three times a day (TID) | INTRAMUSCULAR | Status: DC
  Administered 2017-05-27 – 2017-05-31 (×13): 5000 [IU] via SUBCUTANEOUS
  Filled 2017-05-27 (×13): qty 1

## 2017-05-27 NOTE — Telephone Encounter (Signed)
Social worker called stating that she will need a formal piece of documentation with your recommendation for this patient to move forward with amputation.   She will be looking forward to a call back tomorrow with this information.  Joyce Gross Flack/Social Worker-guardianship of patient

## 2017-05-27 NOTE — Progress Notes (Signed)
[facility] - [place] at [facility]   PATIENT NAME: [name] [name]    MR#:  [id number]  DATE OF BIRTH:  1929/11/18  SUBJECTIVE:   No acute events overnight, discussed with vascular surgery and they spoke to the patient's legal guardian who wanted to discuss with palliative care about goals of care. Palliative care consult has been placed. Plan was for either possibly consider AKA or possible hospice given her advanced dementia and poor prognosis.  REVIEW OF SYSTEMS:    Review of Systems  Unable to perform ROS: Dementia    Nutrition: Dysphagia II Tolerating Diet: Yes Tolerating PT: Bedbound  DRUG ALLERGIES:  No Known Allergies  VITALS:  Blood pressure 125/72, pulse (!) 103, temperature 98 F (36.7 C), temperature source Oral, resp. rate 19, height 5\' 3"  (1.6 m), weight 46.2 kg (101 lb 12.8 oz), SpO2 98 %.  PHYSICAL EXAMINATION:   Physical Exam  GENERAL:  84 y.o.-year-old then demented patient lying in bed in a contracted Position in NAD.  EYES: Pupils equal, round, reactive to light and accommodation. No scleral icterus. Extraocular muscles intact.  HEENT: Head atraumatic, normocephalic. Oropharynx and nasopharynx clear.  NECK:  Supple, no jugular venous distention. No thyroid enlargement, no tenderness.  LUNGS: Normal breath sounds bilaterally, no wheezing, rales, rhonchi. No use of accessory muscles of respiration.  CARDIOVASCULAR: S1, S2 normal. No murmurs, rubs, or gallops.  ABDOMEN: Soft, nontender, nondistended. Bowel sounds present. No organomegaly or mass.  EXTREMITIES: No cyanosis, clubbing or edema b/l.    NEUROLOGIC: Cranial nerves II through XII are intact. No focal Motor or sensory deficits b/l.  Globally weak PSYCHIATRIC: The patient is alert and oriented x 1.  SKIN: No obvious rash, lesion, Left diabetic foot ulcer   LABORATORY PANEL:   CBC Recent Labs  Lab 05/25/17 1300  WBC 5.8  HGB 10.6*  HCT 33.1*  PLT 517*    ------------------------------------------------------------------------------------------------------------------  Chemistries  Recent Labs  Lab 05/27/17 0541  NA 141  K 3.7  CL 109  CO2 23  GLUCOSE 146*  BUN 8  CREATININE 0.48  CALCIUM 8.9   ------------------------------------------------------------------------------------------------------------------  Cardiac Enzymes No results for input(s): TROPONINI in the last 168 hours. ------------------------------------------------------------------------------------------------------------------  RADIOLOGY:  No results found.   ASSESSMENT AND PLAN:   84 year old female with past medical history of dementia, hypertension, hyperlipidemia, diabetes, COPD, CHF who presented to the hospital due to infected left diabetic foot ulcer.  1. Infected left diabetic foot ulcer-patient was admitted directly from the podiatrist's office. -Continue IV vancomycin, Zosyn.  -Seen by vascular surgery and didn't recommend AKA but given her advanced dementia she is not the best candidate for it. They recommended getting a palliative care consult to discuss goals of care with the patient's legal guardian. -Currently afebrile and hemodynamically stable.  2. Diabetes type 2 without complication-continue sliding scale insulin. - BS stable.   3. Hx of chronic atrial fibrillation-rate controlled. Hold Eliquis for now if pt's legal guardian is agreeable to surgery.   4. HTN - anti-HTN on hold as pt. Is normotensive  5. COPD - no acute exacerbation.  - PRN duonebs.   6. GERD - cont. Protonix.   Pt's prognosis is poor given her advanced dementia.  Palliative Care consult obtained to discuss goals of care regarding possible need for above-the-knee amputation.   All the records are reviewed and case discussed with Care Management/Social Worker. Management plans discussed with the patient, family and they are in agreement.  CODE STATUS:  DNR  DVT Prophylaxis: Ted's & SCD's.   TOTAL TIME TAKING CARE OF THIS PATIENT: 25 minutes.   POSSIBLE D/C IN 1-2 DAYS, DEPENDING ON CLINICAL CONDITION.   Houston Siren M.D on 05/27/2017 at 2:35 PM  Between 7am to 6pm - Pager - [phone]  After 6pm go to [email or web address] - Social research officer, government  [facility] [place] [facility]  Office  [phone]  CC: Primary care physician; Angela Cox, MD

## 2017-05-28 DIAGNOSIS — L97929 Non-pressure chronic ulcer of unspecified part of left lower leg with unspecified severity: Secondary | ICD-10-CM

## 2017-05-28 DIAGNOSIS — Z515 Encounter for palliative care: Secondary | ICD-10-CM

## 2017-05-28 DIAGNOSIS — Z7189 Other specified counseling: Secondary | ICD-10-CM

## 2017-05-28 LAB — GLUCOSE, CAPILLARY
GLUCOSE-CAPILLARY: 101 mg/dL — AB (ref 65–99)
Glucose-Capillary: 126 mg/dL — ABNORMAL HIGH (ref 65–99)
Glucose-Capillary: 131 mg/dL — ABNORMAL HIGH (ref 65–99)
Glucose-Capillary: 104 mg/dL — ABNORMAL HIGH (ref 65–99)

## 2017-05-28 MED ORDER — MORPHINE SULFATE (CONCENTRATE) 10 MG/0.5ML PO SOLN: 5.0000 mg | ORAL | Status: DC | PRN

## 2017-05-28 NOTE — Progress Notes (Signed)
Pharmacy Antibiotic Note  [name] [name] is a 84 y.o. female admitted on 05/25/2017 with would infection left foot  Pharmacy has been consulted for Vancomycin and Zosyn dosing.  Patient withnonhealing left foot ulcer infection, need vascular surgery consult for possible BKA. Hx diabetes. Per vascular, patient will need AKA vs palliative care.   Plan: Continue Zosyn 3.375 g EI q 8 hours.   Continue vancomycin 750 mg iv q 24 hours.  Will check Vancomycin trough prior to 5th dose on 1/19.   Height: 5\' 3"  (160 cm) Weight: 101 lb 12.8 oz (46.2 kg) IBW/kg (Calculated) : 52.4  Temp (24hrs), Avg:98.4 F (36.9 C), Min:98 F (36.7 C), Max:98.7 F (37.1 C)  Recent Labs  Lab 05/25/17 1300 05/26/17 0403 05/27/17 0541  WBC 5.8  --   --   CREATININE 0.55 0.50 0.48    Estimated Creatinine Clearance: 37.5 mL/min (by C-G formula based on SCr of 0.48 mg/dL).    No Known Allergies  Antimicrobials this admission: Zosyn 1/15 >>   Vanc  1/15 >>    Dose adjustments this admission:    Microbiology results: 1/15 MRSA PCR: neg  Thank you for allowing pharmacy to be a part of this patient's care.  2/15 D 05/28/2017 12:22 PM

## 2017-05-28 NOTE — Clinical Social Work Note (Signed)
DSS Guardian Lazaro Arms has discussed patient with her supervisor and they are going to make patient comfort measures and have her return tomorrow to [name]. Joyce Gross has spoken to [place] [facility]: [phone] and Leanne Chang will take patient back with [facility] [place] tomorrow. CSW has notified Dr. Cherlynn Kaiser and he will pass along to his colleague, Dr. Allena Katz. CSW has asked that a script for roxanol be written to assist with pain management for patient. Patient will need to transport via EMS.  York Spaniel MSW,LCSW [phone]

## 2017-05-28 NOTE — Progress Notes (Signed)
[facility] - [place] at [facility]   PATIENT NAME: [name] [name]    MR#:  [id number]  DATE OF BIRTH:  [birth year]  SUBJECTIVE:   No acute events overnight, palliative care discussed with the legal guardian about goals of care and they're leaning more towards comfort rather than aggressive intervention with AKA.   REVIEW OF SYSTEMS:    Review of Systems  Unable to perform ROS: Dementia    Nutrition: Dysphagia II Tolerating Diet: Yes Tolerating PT: Bedbound  DRUG ALLERGIES:  No Known Allergies  VITALS:  Blood pressure 111/64, pulse (!) 58, temperature 97.7 F (36.5 C), temperature source Oral, resp. rate 16, height 5\' 3"  (1.6 m), weight 46.2 kg (101 lb 12.8 oz), SpO2 98 %.  PHYSICAL EXAMINATION:   Physical Exam  GENERAL:  84 y.o.-year-old then demented patient lying in bed in a contracted Position in NAD.  EYES: Pupils equal, round, reactive to light and accommodation. No scleral icterus. Extraocular muscles intact.  HEENT: Head atraumatic, normocephalic. Oropharynx and nasopharynx clear.  NECK:  Supple, no jugular venous distention. No thyroid enlargement, no tenderness.  LUNGS: Normal breath sounds bilaterally, no wheezing, rales, rhonchi. No use of accessory muscles of respiration.  CARDIOVASCULAR: S1, S2 normal. No murmurs, rubs, or gallops.  ABDOMEN: Soft, nontender, nondistended. Bowel sounds present. No organomegaly or mass.  EXTREMITIES: No cyanosis, clubbing or edema b/l.    NEUROLOGIC: Cranial nerves II through XII are intact. No focal Motor or sensory deficits b/l.  Globally weak PSYCHIATRIC: The patient is alert and oriented x 1.  SKIN: No obvious rash, lesion, Left diabetic foot ulcer   LABORATORY PANEL:   CBC Recent Labs  Lab 05/25/17 1300  WBC 5.8  HGB 10.6*  HCT 33.1*  PLT 517*   ------------------------------------------------------------------------------------------------------------------  Chemistries  Recent Labs  Lab  05/27/17 0541  NA 141  K 3.7  CL 109  CO2 23  GLUCOSE 146*  BUN 8  CREATININE 0.48  CALCIUM 8.9   ------------------------------------------------------------------------------------------------------------------  Cardiac Enzymes No results for input(s): TROPONINI in the last 168 hours. ------------------------------------------------------------------------------------------------------------------  RADIOLOGY:  No results found.   ASSESSMENT AND PLAN:   84 year old female with past medical history of dementia, hypertension, hyperlipidemia, diabetes, COPD, CHF who presented to the hospital due to infected left diabetic foot ulcer.  1. Infected left diabetic foot ulcer-patient was admitted directly from the podiatrist's office. -Continue IV vancomycin, Zosyn.  -Given advanced dementia a palliative care consult was obtained to discuss goals of care. They discussed with the patient's legal guardian who does not want to pursue aggressive care and is needing more towards comfort. -As per vascular surgery did not recommend AKA given her advanced dementia and poor healing process and she would likely develop vascular disease on her right leg too. Continue supportive care until palliative care gives 85 a final decision.  2. Diabetes type 2 without complication-continue sliding scale insulin. - BS stable.   3. Hx of chronic atrial fibrillation-rate controlled. Cont. To Hold Eliquis for now.    4. HTN - anti-HTN on hold as pt. Is normotensive  5. COPD - no acute exacerbation.  - PRN duonebs.   6. GERD - cont. Protonix.   Pt's prognosis is poor given her advanced dementia..   All the records are reviewed and case discussed with Care Management/Social Worker. Management plans discussed with the patient, family and they are in agreement.  CODE STATUS: DNR  DVT Prophylaxis: Ted's & SCD's.   TOTAL TIME TAKING CARE OF THIS  PATIENT: 25 minutes.   POSSIBLE D/C IN 1-2 DAYS,  DEPENDING ON CLINICAL CONDITION.   Houston Siren M.D on 05/28/2017 at 1:37 PM  Between 7am to 6pm - Pager - [phone]  After 6pm go to [email or web address] - Social research officer, government  [facility] [place] [facility]  Office  [phone]  CC: Primary care physician; Angela Cox, MD

## 2017-05-28 NOTE — Progress Notes (Signed)
New referral for [facility] received from CSW [facility] following a Palliative Medicine consult. Patient has a DSS guardian. Patient information faxed to referral. Planned discharge tomorrow. Signed DNR to accompany patient.  Dayna Barker RN, BSN, [facility] [place], hospital Liaison [phone]

## 2017-05-28 NOTE — Clinical Social Work Note (Signed)
CSW gave new referral to Clydie Braun with [facility]. Clydie Braun stated that our weekend CSW would need to call the hospice intake to see if they were able to obtain an order for hospice from [facility]. If an order was obtained then [facility] could open patient this weekend. If not, then hospice could not open patient and CSW would ask that patient not discharge until hospice is able to follow and monitor patient. Clydie Braun with hospice is hopeful that an order can be obtained from [facility] [employer]. Joyce Gross at DSS states she will make herself available on her cell phone this weekend: [phone] so that the hospital and hospice would be able to reach her. Joyce Gross has stated that she will update Ardyth Gal regarding the above as well.  York Spaniel MSW,LcSW [phone]

## 2017-05-28 NOTE — Consult Note (Signed)
Consultation Note Date: 05/28/2017   Patient Name: [name] [name]  DOB: [birth year]  MRN: [id number]  Age / Sex: 84 y.o., female  PCP: Merlene Laughter, MD Referring Physician: Henreitta Leber, MD  Reason for Consultation: Establishing goals of care  HPI/Patient Profile: 84 y.o. female  with past medical history of dementia, atrial fibrillation, CAD, CHF, HTN, COPD, DM, diverticulosis, urinary incontinence admitted on 05/25/2017 from [facility] with nonhealing left diabetic foot ulcer as direct admit for vascular surgery evaluation. Vascular has recommended left AKA vs hospice care. BKA not an option d/t knee contractures. Palliative care requested for [place] conversation with patient's legal guardian.   Clinical Assessment and Goals of Care: I met today at [name] [name]'s bedside as NT was feeding her. She has no complaints. Able to answer simple questions and offer greetings but seems completely unaware of where she is and what is going on. NT says that she can get her to eat her magic cup but the rest of the food varies to a couple bites to maybe half her tray. Mr. Platner denies any pain/discomfort at this time. She has been wheelchair bound for ~3 years now.   I spoke with legal guardian, Edman Circle, via phone today. Ms. Everrett Coombe has been working with Ms. Kerschner for some time and has seen her progress with dementia. Ms. Buley has no family except a grandson who's contact info is unknown and has not seen her in a long time. We discussed at length aggressive care with AKA vs comfort care and hospice options. We discussed concerns with surgery with her ability to tolerate surgery, ability to heal surgical wound, and impact of surgery on her dementia and QOL overall. Vascular surgery also is concerned that she would continue to develop similar wounds issues on right foot as well leading [place] into this same position.   We  also discussed more about comfort path and what this care would look like. We discussed hospice involvement and that she would like need hospice assistance at nursing facility (would likely need more care than her current facility could provide) with low threshold to transition to hospice facility for comfort. Most likely would have increasing pain concerns and become septic (at this point she would likely become more lethargic). Could consider any role of po antibiotics with attempts to manage or control infection risk???  Primary Decision Maker LEGAL GUARDIAN DSS Marga Melnick     SUMMARY OF RECOMMENDATIONS   - Recommend consideration of no surgery and hospice comfort care - Spoke with CSW (and left message/update for Zigmund Daniel) that she would not be able to be placed at SNF with hospice so would be better to return to current facility with hospice care and low threshold to transition to hospice facility   Code Status/Advance Care Planning:  DNR   Symptom Management:   Pain: Continue Perocet 5-325 mg 1 tablet every 6 hours prn. Added roxanol 5 mg po every 2 hours prn for severe pain not relieved by Percocet.  Palliative Prophylaxis:   Aspiration, Bowel Regimen, Delirium Protocol, Frequent Pain Assessment, Oral Care, Palliative Wound Care and Turn Reposition  Additional Recommendations (Limitations, Scope, Preferences):  Full Comfort Care  Psycho-social/Spiritual:   Desire for further Chaplaincy support:yes  Additional Recommendations: Caregiving  Support/Resources and Education on Hospice  Prognosis:   < 6 weeks most likely  Discharge Planning: Return to ALF/Memory Care with hospice in place     Primary Diagnoses: Present on Admission: . Peripheral vascular disease ([place])   I have reviewed the medical record, interviewed the patient and family, and examined the patient. The following aspects are pertinent.  Past Medical History:  Diagnosis Date  . A-fib ([place])   . Allergic  rhinitis   . Alzheimer disease   . CAD (coronary artery disease)   . Cardiomyopathy ([place])   . CHF (congestive heart failure) ([place])   . COPD (chronic obstructive pulmonary disease) ([place])   . Dementia   . Diabetes mellitus without complication ([place])   . Diverticulosis   . Eczema   . Hyperlipemia   . Hypertension   . Urinary incontinence    Social History   Socioeconomic History  . Marital status: Widowed    Spouse name: Not on file  . Number of children: Not on file  . Years of education: Not on file  . Highest education level: Not on file  Social Needs  . Financial resource strain: Not on file  . Food insecurity - worry: Not on file  . Food insecurity - inability: Not on file  . Transportation needs - medical: Not on file  . Transportation needs - non-medical: Not on file  Occupational History  . Not on file  Tobacco Use  . Smoking status: Former Research scientist (life sciences)  . Smokeless tobacco: Former Network engineer and Sexual Activity  . Alcohol use: No  . Drug use: No  . Sexual activity: Not on file  Other Topics Concern  . Not on file  Social History Narrative  . Not on file   Family History  Problem Relation Age of Onset  . Hypertension Father    Scheduled Meds: . feeding supplement (ENSURE ENLIVE)  237 mL Oral TID BM  . heparin injection (subcutaneous)  5,000 Units Subcutaneous Q8H  . insulin aspart  0-9 Units Subcutaneous TID WC  . multivitamin with minerals  1 tablet Oral Daily  . multivitamin-lutein  1 capsule Oral Daily  . pantoprazole  40 mg Oral Daily  . senna  1 tablet Oral BID  . vitamin C  250 mg Oral BID   Continuous Infusions: . piperacillin-tazobactam (ZOSYN)  IV 3.375 g (05/28/17 0818)  . Vancomycin 750 mg (05/28/17 0818)   PRN Meds:.acetaminophen **OR** acetaminophen, morphine injection, ondansetron **OR** ondansetron (ZOFRAN) IV, oxyCODONE-acetaminophen, polyethylene glycol No Known Allergies Review of Systems  Unable to perform ROS: Dementia     Physical Exam  Constitutional: She appears well-developed. She appears ill.  HENT:  Head: Normocephalic and atraumatic.  Cardiovascular: Normal rate and regular rhythm.  Pulmonary/Chest: Effort normal. No accessory muscle usage. No tachypnea. No respiratory distress.  Abdominal: Soft. Normal appearance.  Neurological: She is alert. She is disoriented.  Pleasant, calm  Nursing note and vitals reviewed.   Vital Signs: BP 105/78 (BP Location: Left Arm)   Pulse 97   Temp 98.4 F (36.9 C) (Oral)   Resp 18   Ht 5' 3"  (1.6 m)   Wt 46.2 kg (101 lb 12.8 oz)   SpO2 99%   BMI 18.03 kg/m  Pain Assessment: Faces POSS *See Group Information*: 1-Acceptable,Awake and alert Pain Score: Asleep   SpO2: SpO2: 99 % O2 Device:SpO2: 99 % O2 Flow Rate: .   IO: Intake/output summary:   Intake/Output Summary (Last 24 hours) at 05/28/2017 0939 Last data filed at 05/28/2017 0659 Gross per 24 hour  Intake 1382 ml  Output 405 ml  Net 977 ml    LBM: Last BM Date: 05/27/17 Baseline Weight: Weight: 46.2 kg (101 lb 12.8 oz) Most recent weight: Weight: 46.2 kg (101 lb 12.8 oz)     Palliative Assessment/Data: 30%    Time Total: 70 min  Greater than 50%  of this time was spent counseling and coordinating care related to the above assessment and plan.  Signed by: Vinie Sill, NP Palliative Medicine Team Pager # [phone] (M-F 8a-5p) Team Phone # [phone] (Nights/Weekends)

## 2017-05-29 LAB — GLUCOSE, CAPILLARY
GLUCOSE-CAPILLARY: 146 mg/dL — AB (ref 65–99)
GLUCOSE-CAPILLARY: 203 mg/dL — AB (ref 65–99)
Glucose-Capillary: 162 mg/dL — ABNORMAL HIGH (ref 65–99)
Glucose-Capillary: 113 mg/dL — ABNORMAL HIGH (ref 65–99)

## 2017-05-29 LAB — CREATININE, SERUM
CREATININE: 0.59 mg/dL (ref 0.44–1.00)
GFR calc Af Amer: >60 mL/min (ref >60)
GFR calc non Af Amer: >60 mL/min (ref >60)

## 2017-05-29 LAB — VANCOMYCIN, TROUGH: VANCOMYCIN TR: 9 ug/mL — AB (ref 15–20)

## 2017-05-29 NOTE — Clinical Social Work Note (Addendum)
CSW has placed call to [facility] to inquire about the status of the hospice referral for home hospice. CSW is waiting for call back from on call representative.   CSW spoke with [facility]. The patient must have legal guardian sign consents, and the guardian will attempt to access consent forms via email as she is currently out of state. The DSS worker Lazaro Arms is aware via communication with Inetta Fermo at [facility]. CSW will update as information is available.  Argentina Ponder, MSW, Theresia Majors [phone]

## 2017-05-29 NOTE — Progress Notes (Signed)
[facility] - [place] at [facility]   PATIENT NAME: [name] [name]    MR#:  [id number]  DATE OF BIRTH:  04-28-1930  SUBJECTIVE:   No acute events overnight, palliative care discussed with the legal guardian about goals of care and they're leaning more towards comfort rather than aggressive intervention with AKA.   REVIEW OF SYSTEMS:    Review of Systems  Unable to perform ROS: Dementia    Nutrition: Dysphagia II Tolerating Diet: Yes Tolerating PT: Bedbound  DRUG ALLERGIES:  No Known Allergies  VITALS:  Blood pressure 126/84, pulse 63, temperature 97.6 F (36.4 C), temperature source Oral, resp. rate (!) 22, height 5\' 3"  (1.6 m), weight 46.2 kg (101 lb 12.8 oz), SpO2 99 %.  PHYSICAL EXAMINATION:   Physical Exam  GENERAL:  84 y.o.-year-old then demented patient lying in bed in a contracted Position in NAD.  EYES: Pupils equal, round, reactive to light and accommodation. No scleral icterus. Extraocular muscles intact.  HEENT: Head atraumatic, normocephalic. Oropharynx and nasopharynx clear.  NECK:  Supple, no jugular venous distention. No thyroid enlargement, no tenderness.  LUNGS: Normal breath sounds bilaterally, no wheezing, rales, rhonchi. No use of accessory muscles of respiration.  CARDIOVASCULAR: S1, S2 normal. No murmurs, rubs, or gallops.  ABDOMEN: Soft, nontender, nondistended. Bowel sounds present. No organomegaly or mass.  EXTREMITIES: No cyanosis, clubbing or edema b/l.    NEUROLOGIC: Cranial nerves II through XII are intact. No focal Motor or sensory deficits b/l.  Globally weak PSYCHIATRIC: The patient is alert and oriented x 1.  SKIN: No obvious rash, lesion, Left diabetic foot ulcer   LABORATORY PANEL:   CBC Recent Labs  Lab 05/25/17 1300  WBC 5.8  HGB 10.6*  HCT 33.1*  PLT 517*   ------------------------------------------------------------------------------------------------------------------  Chemistries  Recent Labs  Lab  05/27/17 0541 05/29/17 0747  NA 141  --   K 3.7  --   CL 109  --   CO2 23  --   GLUCOSE 146*  --   BUN 8  --   CREATININE 0.48 0.59  CALCIUM 8.9  --    ------------------------------------------------------------------------------------------------------------------  Cardiac Enzymes No results for input(s): TROPONINI in the last 168 hours. ------------------------------------------------------------------------------------------------------------------  RADIOLOGY:  No results found.   ASSESSMENT AND PLAN:   84 year old female with past medical history of dementia, hypertension, hyperlipidemia, diabetes, COPD, CHF who presented to the hospital due to infected left diabetic foot ulcer.  1. Infected left diabetic foot ulcer-patient was admitted directly from the podiatrist's office. -recieved IV vancomycin, Zosyn--now d/ced since she is comfort care only -Given advanced dementia a palliative care consult was obtained to discuss goals of care. They discussed with the patient's legal guardian who does not want to pursue aggressive care and is now comfort care. -As per vascular surgery did not recommend AKA given her advanced dementia and poor healing process and she would likely develop vascular disease on her right leg too.   2. Diabetes type 2 without complication-continue sliding scale insulin. - BS stable.   3. Hx of chronic atrial fibrillation-rate controlled. Cont. To Hold Eliquis for now.    4. HTN - anti-HTN on hold as pt. Is normotensive  5. COPD - no acute exacerbation.  - PRN duonebs.   6. GERD - cont. Protonix.   Pt's prognosis is poor given her advanced dementia  [facility] orders are in place thru [facility] making house call for [facility] to open her case.  All the records are reviewed and case discussed with Care Management/Social Worker. Management plans discussed with the patient, family and they are in agreement.  CODE  STATUS: DNR  DVT Prophylaxis: Ted's & SCD's.   TOTAL TIME TAKING CARE OF THIS PATIENT: 25 minutes.   POSSIBLE D/C pending hospice approval thru PCP    Enedina Finner M.D on 05/29/2017 at 8:33 AM  Between 7am to 6pm - Pager - [phone]  After 6pm go to [email or web address] - Social research officer, government  [facility] [place] [facility]  Office  [phone]  CC: Primary care physician; Angela Cox, MD

## 2017-05-30 LAB — CBC
HCT: 32.5 % — ABNORMAL LOW (ref 35.0–47.0)
HEMOGLOBIN: 10.5 g/dL — AB (ref 12.0–16.0)
MCH: 29.3 pg (ref 26.0–34.0)
MCHC: 32.2 g/dL (ref 32.0–36.0)
MCV: 90.8 fL (ref 80.0–100.0)
PLATELETS: 393 10*3/uL (ref 150–440)
RBC: 3.57 MIL/uL — ABNORMAL LOW (ref 3.80–5.20)
RDW: 16.4 % — ABNORMAL HIGH (ref 11.5–14.5)
WBC: 6.5 10*3/uL (ref 3.6–11.0)

## 2017-05-30 LAB — GLUCOSE, CAPILLARY
GLUCOSE-CAPILLARY: 136 mg/dL — AB (ref 65–99)
GLUCOSE-CAPILLARY: 132 mg/dL — AB (ref 65–99)
GLUCOSE-CAPILLARY: 233 mg/dL — AB (ref 65–99)
Glucose-Capillary: 142 mg/dL — ABNORMAL HIGH (ref 65–99)
Glucose-Capillary: 138 mg/dL — ABNORMAL HIGH (ref 65–99)

## 2017-05-30 NOTE — Progress Notes (Signed)
[facility] - [place] at [facility]   PATIENT NAME: Adamarie Izzo    MR#:  [id number]  DATE OF BIRTH:  July 27, 1929  SUBJECTIVE:   No acute events overnight, palliative care discussed with the legal guardian about goals of care and they're leaning more towards comfort rather than aggressive intervention with AKA.   REVIEW OF SYSTEMS:    Review of Systems  Unable to perform ROS: Dementia    Nutrition: Dysphagia II Tolerating Diet: Yes Tolerating PT: Bedbound  DRUG ALLERGIES:  No Known Allergies  VITALS:  Blood pressure 110/73, pulse 95, temperature 98.2 F (36.8 C), temperature source Oral, resp. rate 20, height 5\' 3"  (1.6 m), weight 46.2 kg (101 lb 12.8 oz), SpO2 98 %.  PHYSICAL EXAMINATION:   Physical Exam  GENERAL:  84 y.o.-year-old then demented patient lying in bed in a contracted Position in NAD.  EYES: Pupils equal, round, reactive to light and accommodation. No scleral icterus. Extraocular muscles intact.  HEENT: Head atraumatic, normocephalic. Oropharynx and nasopharynx clear.  NECK:  Supple, no jugular venous distention. No thyroid enlargement, no tenderness.  LUNGS: Normal breath sounds bilaterally, no wheezing, rales, rhonchi. No use of accessory muscles of respiration.  CARDIOVASCULAR: S1, S2 normal. No murmurs, rubs, or gallops.  ABDOMEN: Soft, nontender, nondistended. Bowel sounds present. No organomegaly or mass.  EXTREMITIES: No cyanosis, clubbing or edema b/l.    NEUROLOGIC: Cranial nerves II through XII are intact. No focal Motor or sensory deficits b/l.  Globally weak PSYCHIATRIC: The patient is alert and oriented x 1.  SKIN: No obvious rash, lesion, Left diabetic foot ulcer   LABORATORY PANEL:   CBC Recent Labs  Lab 05/30/17 0424  WBC 6.5  HGB 10.5*  HCT 32.5*  PLT 393   ------------------------------------------------------------------------------------------------------------------  Chemistries  Recent Labs  Lab  05/27/17 0541 05/29/17 0747  NA 141  --   K 3.7  --   CL 109  --   CO2 23  --   GLUCOSE 146*  --   BUN 8  --   CREATININE 0.48 0.59  CALCIUM 8.9  --    ------------------------------------------------------------------------------------------------------------------  Cardiac Enzymes No results for input(s): TROPONINI in the last 168 hours. ------------------------------------------------------------------------------------------------------------------  RADIOLOGY:  No results found.   ASSESSMENT AND PLAN:   84 year old female with past medical history of dementia, hypertension, hyperlipidemia, diabetes, COPD, CHF who presented to the hospital due to infected left diabetic foot ulcer.  1. Infected left diabetic foot ulcer-patient was admitted directly from the podiatrist's office. -recieved IV vancomycin, Zosyn--now d/ced since she is comfort care only -Given advanced dementia a palliative care consult was obtained to discuss goals of care. They discussed with the patient's legal guardian who does not want to pursue aggressive care and is now comfort care. -As per vascular surgery did not recommend AKA given her advanced dementia and poor healing process and she would likely develop vascular disease on her right leg too.   2. Diabetes type 2 without complication-continue sliding scale insulin. - BS stable.   3. Hx of chronic atrial fibrillation-rate controlled. Cont. To Hold Eliquis for now.    4. HTN - anti-HTN on hold as pt. Is normotensive  5. COPD - no acute exacerbation.  - PRN duonebs.   6. GERD - cont. Protonix.   Pt's prognosis is poor given her advanced dementia  [facility] if [facility] orders are in place thru [facility] making house call for [facility] to open her case.  All the records are reviewed and case discussed with Care Management/Social Worker. Management plans discussed with the patient, family and they are in agreement.  CODE  STATUS: DNR  DVT Prophylaxis: Ted's & SCD's.   TOTAL TIME TAKING CARE OF THIS PATIENT: 25 minutes.   POSSIBLE D/C pending hospice approval thru PCP    Enedina Finner M.D on 05/30/2017 at 1:24 PM  Between 7am to 6pm - Pager - [phone]  After 6pm go to [email or web address] - Social research officer, government  [facility] [place] [facility]  Office  [phone]  CC: Primary care physician; Angela Cox, MD

## 2017-05-30 NOTE — Plan of Care (Signed)
Patient will be going back to [facility] with hospice.  [name] [name]

## 2017-05-31 ENCOUNTER — Ambulatory Visit: Payer: Medicare Other | Admitting: Physician Assistant

## 2017-05-31 LAB — GLUCOSE, CAPILLARY
GLUCOSE-CAPILLARY: 133 mg/dL — AB (ref 65–99)
GLUCOSE-CAPILLARY: 110 mg/dL — AB (ref 65–99)
Glucose-Capillary: 146 mg/dL — ABNORMAL HIGH (ref 65–99)

## 2017-05-31 MED ORDER — MORPHINE SULFATE (CONCENTRATE) 10 MG/0.5ML PO SOLN: 5.0000 mg | ORAL | 0 refills | Status: AC | PRN

## 2017-05-31 MED ORDER — ENSURE ENLIVE PO LIQD: 237.0000 mL | Freq: Three times a day (TID) | ORAL | 12 refills | Status: AC

## 2017-05-31 NOTE — Progress Notes (Signed)
MD ordered patient to be discharged back to [facility] with Hospice care.  I called and spoke to [place], Hospice RN, to let her know that the patient was leaving.  IV was removed with catheter intact.  VSS. Patient left via stretcher escorted by EMS.

## 2017-05-31 NOTE — NC FL2 (Signed)
[place] MEDICAID FL2 LEVEL OF CARE SCREENING TOOL     IDENTIFICATION  Patient Name: [name] [name] Birthdate: 07-06-1929 Sex: female Admission Date (Current Location): 05/25/2017  [facility] and [place] Number:  Chiropodist and Address:  [facility], [address] [place], [place] [address]      Provider Number: [id number]  Attending Physician Name and Address:  Enedina Finner, MD  Relative Name and Phone Number:       Current Level of Care: Hospital Recommended Level of Care: [facility] Prior Approval Number:    Date Approved/Denied:   PASRR Number:    Discharge Plan: Domiciliary (Rest home)    Current Diagnoses: Patient Active Problem List   Diagnosis Date Noted  . Nonhealing ulcer of left lower extremity (HCC)   . Goals of care, counseling/discussion   . Encounter for hospice care discussion   . Palliative care encounter   . Protein-calorie malnutrition, severe 05/26/2017  . Peripheral vascular disease (HCC) 05/25/2017  . Pressure injury of skin 02/20/2017  . Gangrene of toe of left foot (HCC) 02/19/2017  . Sinus tachycardia 12/09/2015    Orientation RESPIRATION BLADDER Height & Weight     Self  Normal Incontinent Weight: 101 lb 12.8 oz (46.2 kg) Height:  5\' 3"  (160 cm)  BEHAVIORAL SYMPTOMS/MOOD NEUROLOGICAL BOWEL NUTRITION STATUS  (none) (none) Incontinent Diet(dysphagia 2)  AMBULATORY STATUS COMMUNICATION OF NEEDS Skin   Total Care Verbally PU Stage and Appropriate Care                       Personal Care Assistance Level of Assistance  Total care       Total Care Assistance: Maximum assistance   Functional Limitations Info  (none reported)          SPECIAL CARE FACTORS FREQUENCY                       Contractures Contractures Info: Present    Additional Factors Info  Code Status Code Status Info: dnr             DISCHARGE MEDICATIONS:   Allergies as of 05/31/2017   No Known  Allergies             Medication List     STOP taking these medications   albuterol 108 (90 Base) MCG/ACT inhaler Commonly known as:  PROVENTIL HFA;VENTOLIN HFA   apixaban 2.5 MG Tabs tablet Commonly known as:  ELIQUIS   ciprofloxacin 250 MG tablet Commonly known as:  CIPRO   diclofenac sodium 1 % Gel Commonly known as:  VOLTAREN   digoxin 0.125 MG tablet Commonly known as:  LANOXIN   fexofenadine 180 MG tablet Commonly known as:  ALLEGRA   fluticasone 50 MCG/ACT nasal spray Commonly known as:  FLONASE   glipiZIDE 5 MG tablet Commonly known as:  GLUCOTROL   metFORMIN 1000 MG tablet Commonly known as:  GLUCOPHAGE   metoprolol tartrate 25 MG tablet Commonly known as:  LOPRESSOR   metoprolol tartrate 25 mg/10 mL Susp Commonly known as:  LOPRESSOR   montelukast 10 MG tablet Commonly known as:  SINGULAIR   mupirocin ointment 2 % Commonly known as:  BACTROBAN   omeprazole 20 MG capsule Commonly known as:  PRILOSEC   potassium chloride 20 MEQ/15ML (10%) Soln     TAKE these medications   ACETAMINOPHEN EXTRA STRENGTH 167 MG/5ML Liqd Generic drug:  Acetaminophen Take 30 mLs by  mouth 3 (three) times daily.   feeding supplement (ENSURE ENLIVE) Liqd Take 237 mLs by mouth 3 (three) times daily between meals.   ipratropium 0.02 % nebulizer solution Commonly known as:  ATROVENT Take 0.5 mg by nebulization daily.   mirtazapine 7.5 MG tablet Commonly known as:  REMERON Take 7.5 mg by mouth at bedtime.   morphine CONCENTRATE 10 MG/0.5ML Soln concentrated solution Take 0.25 mLs (5 mg total) by mouth every 2 (two) hours as needed for severe pain or shortness of breath.   polyethylene glycol packet Commonly known as:  MIRALAX / GLYCOLAX Take 17 g by mouth daily.   SANTYL EX Apply topically.   traMADol 50 MG tablet Commonly known as:  ULTRAM Take 1 tablet (50 mg total) by mouth every 6 (six) hours as needed for moderate pain.      Additional Information [facility] at [facility] [place]

## 2017-05-31 NOTE — Discharge Summary (Signed)
[facility] [place] at [facility]   PATIENT NAME: [name] [name]    MR#:  [id number]  DATE OF BIRTH:  [birth year]  DATE OF ADMISSION:  05/25/2017 ADMITTING PHYSICIAN: Shaune Pollack, MD  DATE OF DISCHARGE:05/31/2017  PRIMARY CARE PHYSICIAN: Angela Cox, MD    ADMISSION DIAGNOSIS:  exposed tendons mulitple ulcers lt foot  DISCHARGE DIAGNOSIS:  Severe Peripheral vascular disease Infected Diabetic Foot ulcer SECONDARY DIAGNOSIS:   Past Medical History:  Diagnosis Date  . A-fib (HCC)   . Allergic rhinitis   . Alzheimer disease   . CAD (coronary artery disease)   . Cardiomyopathy (HCC)   . CHF (congestive heart failure) (HCC)   . COPD (chronic obstructive pulmonary disease) (HCC)   . Dementia   . Diabetes mellitus without complication (HCC)   . Diverticulosis   . Eczema   . Hyperlipemia   . Hypertension   . Urinary incontinence     HOSPITAL COURSE:   84 year old female with past medical history of dementia, hypertension, hyperlipidemia, diabetes, COPD, CHF who presented to the hospital due to infected left diabetic foot ulcer.  1. Infected left diabetic foot ulcer-patient was admitted directly from the podiatrist's office. -recieved IV vancomycin, Zosyn--now d/ced since she is comfort care only -Given advanced dementia a palliative care consult was obtained to discuss goals of care. They discussed with the patient's legal guardian who does not want to pursue aggressive care and is now comfort care. -As per vascular surgery did not recommend AKA given her advanced dementia and poor healing process and she would likely develop vascular disease on her right leg too.   2. Diabetes type 2 without complication-continue sliding scale insulin. - BS stable.   3. Hx of chronic atrial fibrillation-rate controlled. Cont. To Hold Eliquis for now.    4. HTN - anti-HTN on hold as pt. Is normotensive  5. COPD - no acute exacerbation.  - PRN duonebs.    6. GERD - cont. Protonix.   Pt's prognosis is poor given her advanced dementia  Pt will d/c to [facility] since hospice orders are in place thru Doctors making house call for [place] [facility] to open her case.  Poor prognosis.   CONSULTS OBTAINED:  Treatment Team:  Annice Needy, MD  DRUG ALLERGIES:  No Known Allergies  DISCHARGE MEDICATIONS:   Allergies as of 05/31/2017   No Known Allergies     Medication List    STOP taking these medications   albuterol 108 (90 Base) MCG/ACT inhaler Commonly known as:  PROVENTIL HFA;VENTOLIN HFA   apixaban 2.5 MG Tabs tablet Commonly known as:  ELIQUIS   ciprofloxacin 250 MG tablet Commonly known as:  CIPRO   diclofenac sodium 1 % Gel Commonly known as:  VOLTAREN   digoxin 0.125 MG tablet Commonly known as:  LANOXIN   fexofenadine 180 MG tablet Commonly known as:  ALLEGRA   fluticasone 50 MCG/ACT nasal spray Commonly known as:  FLONASE   glipiZIDE 5 MG tablet Commonly known as:  GLUCOTROL   metFORMIN 1000 MG tablet Commonly known as:  GLUCOPHAGE   metoprolol tartrate 25 MG tablet Commonly known as:  LOPRESSOR   metoprolol tartrate 25 mg/10 mL Susp Commonly known as:  LOPRESSOR   montelukast 10 MG tablet Commonly known as:  SINGULAIR   mupirocin ointment 2 % Commonly known as:  BACTROBAN   omeprazole 20 MG capsule Commonly known as:  PRILOSEC   potassium chloride 20 MEQ/15ML (10%) Soln  TAKE these medications   ACETAMINOPHEN EXTRA STRENGTH 167 MG/5ML Liqd Generic drug:  Acetaminophen Take 30 mLs by mouth 3 (three) times daily.   feeding supplement (ENSURE ENLIVE) Liqd Take 237 mLs by mouth 3 (three) times daily between meals.   ipratropium 0.02 % nebulizer solution Commonly known as:  ATROVENT Take 0.5 mg by nebulization daily.   mirtazapine 7.5 MG tablet Commonly known as:  REMERON Take 7.5 mg by mouth at bedtime.   morphine CONCENTRATE 10 MG/0.5ML Soln concentrated solution Take 0.25  mLs (5 mg total) by mouth every 2 (two) hours as needed for severe pain or shortness of breath.   polyethylene glycol packet Commonly known as:  MIRALAX / GLYCOLAX Take 17 g by mouth daily.   SANTYL EX Apply topically.   traMADol 50 MG tablet Commonly known as:  ULTRAM Take 1 tablet (50 mg total) by mouth every 6 (six) hours as needed for moderate pain.       If you experience worsening of your admission symptoms, develop shortness of breath, life threatening emergency, suicidal or homicidal thoughts you must seek medical attention immediately by calling 911 or calling your MD immediately  if symptoms less severe.  You Must read complete instructions/literature along with all the possible adverse reactions/side effects for all the Medicines you take and that have been prescribed to you. Take any new Medicines after you have completely understood and accept all the possible adverse reactions/side effects.   Please note  You were cared for by a hospitalist during your hospital stay. If you have any questions about your discharge medications or the care you received while you were in the hospital after you are discharged, you can call the unit and asked to speak with the hospitalist on call if the hospitalist that took care of you is not available. Once you are discharged, your primary care physician will handle any further medical issues. Please note that NO REFILLS for any discharge medications will be authorized once you are discharged, as it is imperative that you return to your primary care physician (or establish a relationship with a primary care physician if you do not have one) for your aftercare needs so that they can reassess your need for medications and monitor your lab values. Today   SUBJECTIVE   Non verbal  VITAL SIGNS:  Blood pressure 123/62, pulse 73, temperature 97.7 F (36.5 C), temperature source Oral, resp. rate 18, height 5\' 3"  (1.6 m), weight 46.2 kg (101 lb 12.8  oz), SpO2 96 %.  I/O:    Intake/Output Summary (Last 24 hours) at 05/31/2017 0930 Last data filed at 05/31/2017 0500 Gross per 24 hour  Intake 817 ml  Output 200 ml  Net 617 ml    PHYSICAL EXAMINATION:   GENERAL:  84 y.o.-year-old then demented patient lying in bed in a contracted Position in NAD.  EYES: Pupils equal, round, reactive to light and accommodation. No scleral icterus. Extraocular muscles intact.  HEENT: Head atraumatic, normocephalic. Oropharynx and nasopharynx clear.  NECK:  Supple, no jugular venous distention. No thyroid enlargement, no tenderness.  LUNGS: Normal breath sounds bilaterally, no wheezing, rales, rhonchi. No use of accessory muscles of respiration.  CARDIOVASCULAR: S1, S2 normal. No murmurs, rubs, or gallops.  ABDOMEN: Soft, nontender, nondistended. Bowel sounds present. No organomegaly or mass.  EXTREMITIES: No cyanosis, clubbing or edema b/l.    NEUROLOGIC: Cranial nerves II through XII are intact. No focal Motor or sensory deficits b/l.  Globally weak PSYCHIATRIC: The patient  is alert and oriented x 1.  SKIN: No obvious rash, lesion, Left diabetic foot ulcer   DATA REVIEW:   CBC  Recent Labs  Lab 05/30/17 0424  WBC 6.5  HGB 10.5*  HCT 32.5*  PLT 393    Chemistries  Recent Labs  Lab 05/27/17 0541 05/29/17 0747  NA 141  --   K 3.7  --   CL 109  --   CO2 23  --   GLUCOSE 146*  --   BUN 8  --   CREATININE 0.48 0.59  CALCIUM 8.9  --     Microbiology Results   Recent Results (from the past 240 hour(s))  Aerobic Culture (superficial specimen)     Status: None   Collection Time: 05/24/17  2:00 PM  Result Value Ref Range Status   Specimen Description   Final    Foot, Left Performed at [facility], [address], [place], [place] [address]    Special Requests   Final    NONE Performed at [facility], [address] [place], [place] [address]    Gram Stain   Final    NO WBC SEEN ABUNDANT GRAM POSITIVE  COCCI FEW GRAM NEGATIVE RODS Performed at [facility], [address] [place], [place] [address]    Culture ABUNDANT PSEUDOMONAS AERUGINOSA  Final   Report Status 05/27/2017 FINAL  Final   Organism ID, Bacteria PSEUDOMONAS AERUGINOSA  Final      Susceptibility   Pseudomonas aeruginosa - MIC*    CEFTAZIDIME 4 SENSITIVE Sensitive     CIPROFLOXACIN <=0.25 SENSITIVE Sensitive     GENTAMICIN 2 SENSITIVE Sensitive     IMIPENEM 1 SENSITIVE Sensitive     PIP/TAZO 8 SENSITIVE Sensitive     CEFEPIME 4 SENSITIVE Sensitive     * ABUNDANT PSEUDOMONAS AERUGINOSA  MRSA PCR Screening     Status: None   Collection Time: 05/25/17 12:11 PM  Result Value Ref Range Status   MRSA by PCR NEGATIVE NEGATIVE Final    Comment:        The GeneXpert MRSA Assay (FDA approved for NASAL specimens only), is one component of a comprehensive MRSA colonization surveillance program. It is not intended to diagnose MRSA infection nor to guide or monitor treatment for MRSA infections. Performed at [facility], [address], [place], [place] [address]     RADIOLOGY:  No results found.   Management plans discussed with the patient, family and they are in agreement.  CODE STATUS:     Code Status Orders  (From admission, onward)        Start     Ordered   05/25/17 1236  Do not attempt resuscitation (DNR)  Continuous    Question Answer Comment  In the event of cardiac or respiratory ARREST Do not call a "code blue"   In the event of cardiac or respiratory ARREST Do not perform Intubation, CPR, defibrillation or ACLS   In the event of cardiac or respiratory ARREST Use medication by any route, position, wound care, and other measures to relive pain and suffering. May use oxygen, suction and manual treatment of airway obstruction as needed for comfort.   Comments per records      05/25/17 1235    Code Status History    Date Active Date Inactive Code Status Order ID Comments User  Context   02/19/2017 18:41 03/03/2017 21:59 DNR [id number]  Milagros Loll, MD ED   12/10/2015 08:33 12/11/2015  18:33 DNR [id number]  Delfino Lovett, MD Inpatient   12/09/2015 15:00 12/09/2015 17:18 Full Code [id number]  Auburn Bilberry, MD ED      TOTAL TIME TAKING CARE OF THIS PATIENT: 40 minutes.    Enedina Finner M.D on 05/31/2017 at 9:30 AM  Between 7am to 6pm - Pager - [phone] After 6pm go to [email or web address] - password [email or web address]  [facility]  Office  [phone]  CC: Primary care physician; Angela Cox, MD

## 2017-05-31 NOTE — Clinical Social Work Note (Signed)
Patient discharging today to return to [facility]. CSW received call from Debra at [facility] that they received the paperwork needed yesterday from the DSS guardian. CSW contacted [place] at [facility] and she is aware patient is coming today. Discharge information sent. York Spaniel MSW,LCSW [phone]

## 2017-07-09 DEATH — deceased

## 2019-09-16 IMAGING — CT CT HEAD W/O CM
3 series · 15 of 47 positions shown, 18 images · non-contrast
Comparison: 09/25/2013, 04/09/2013 and earlier.

CLINICAL DATA: 87-year-old who fell out of her wheelchair at the
[HOSPITAL] earlier today. Current history of dementia. Initial
encounter.

EXAM:
CT HEAD WITHOUT CONTRAST
TECHNIQUE: Contiguous axial images were obtained from the base of the skull
through the vertex without intravenous contrast.

[Series 3: head wo · axial · 0.44mm/px · z∈[+569,+694]mm · 9 of 31 slices shown, 12 images]
[im 3/31  brain]
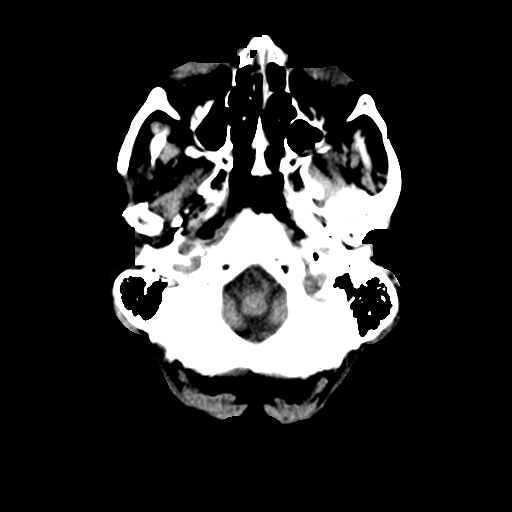
[im 3/31  bone]
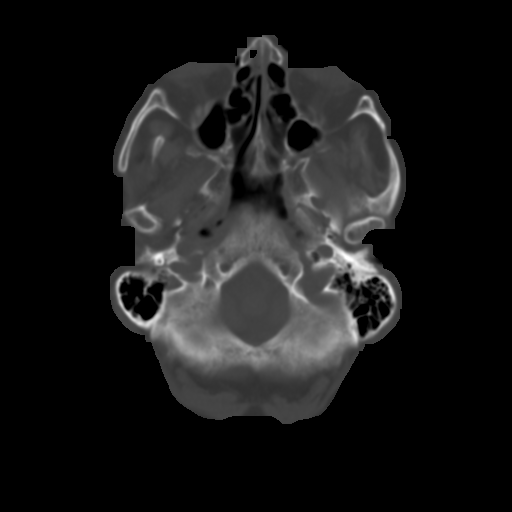
[im 6/31  brain]
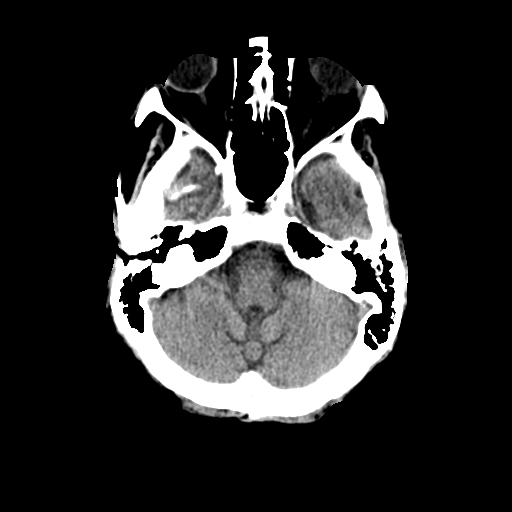
[im 9/31  brain]
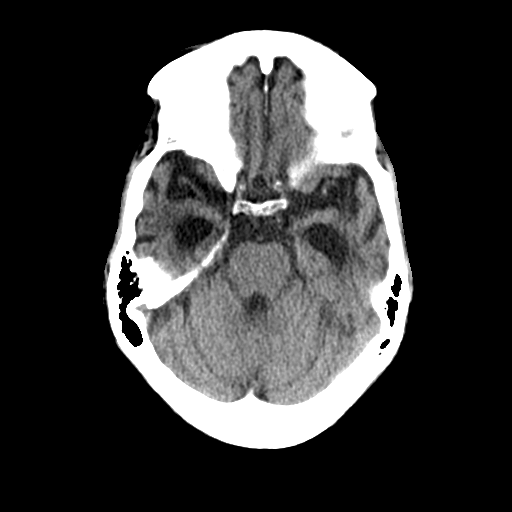
[im 12/31  brain]
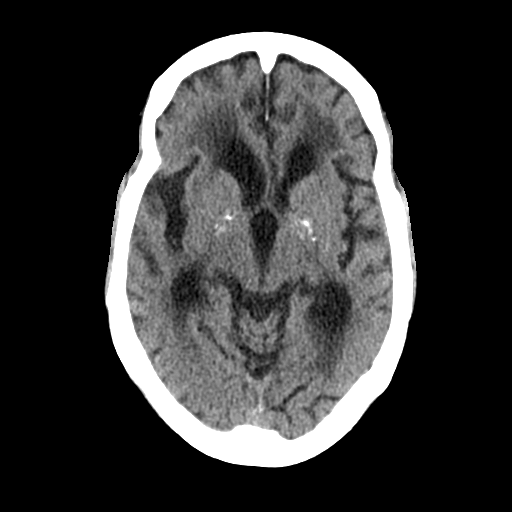
[im 16/31  brain]
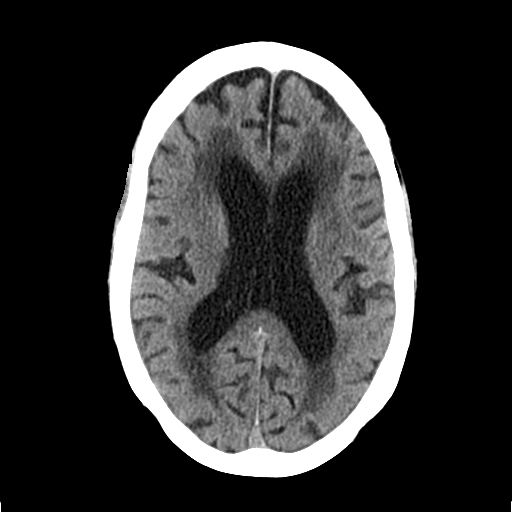
[im 16/31  bone]
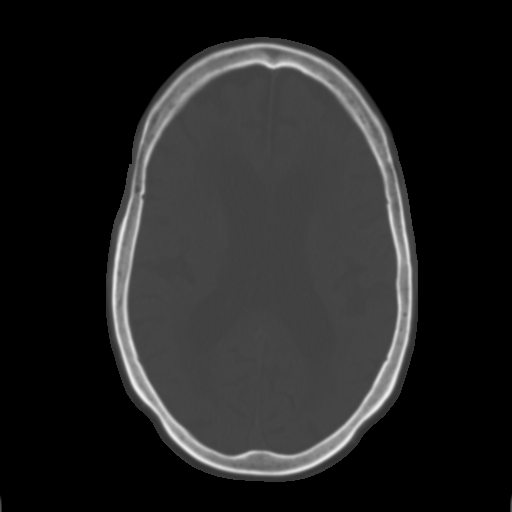
[im 19/31  brain]
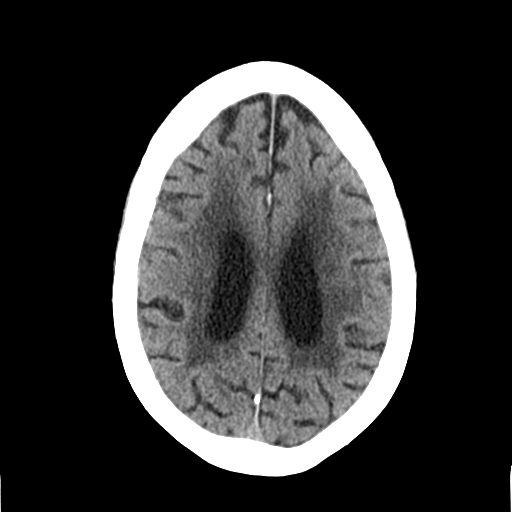
[im 22/31  brain]
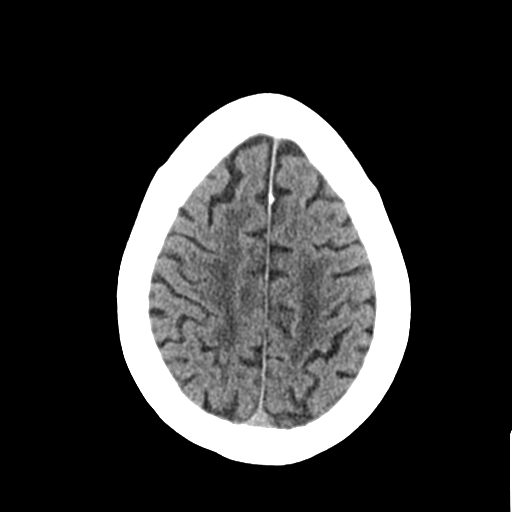
[im 25/31  brain]
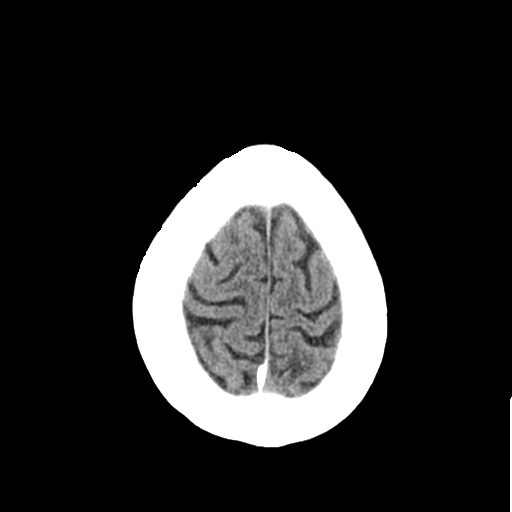
[im 28/31  brain]
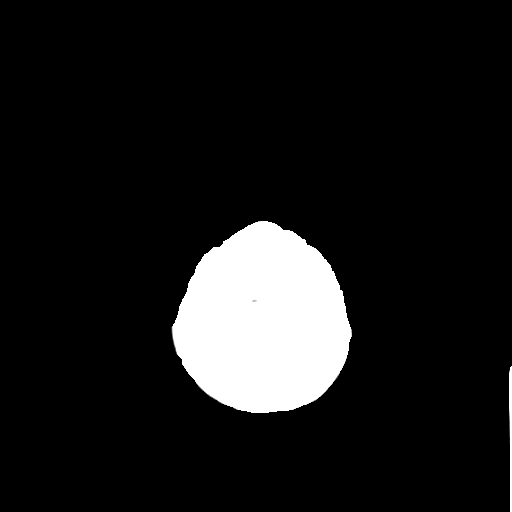
[im 28/31  bone]
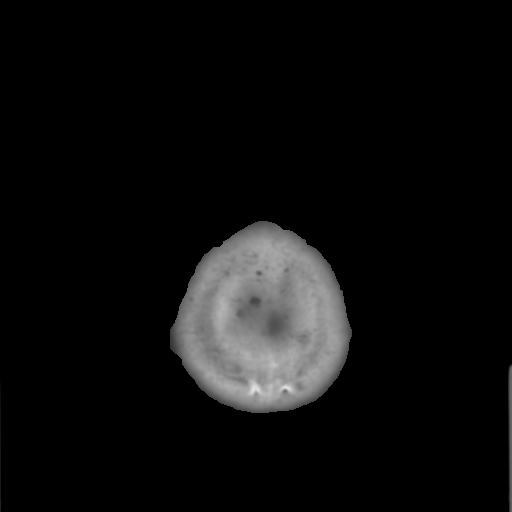

[Series 4: coronal soft tissue · coronal · 0.28mm/px · 3 of 67 slices shown]
[im 23/67  brain]
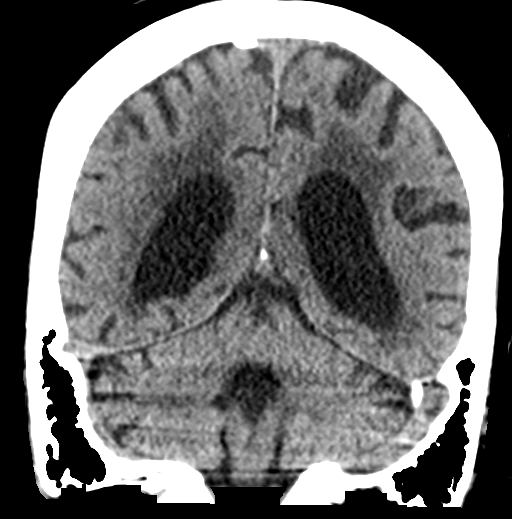
[im 30/67  brain]
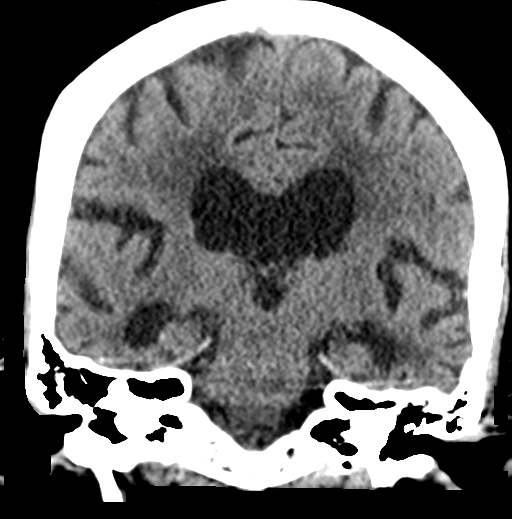
[im 37/67  brain]
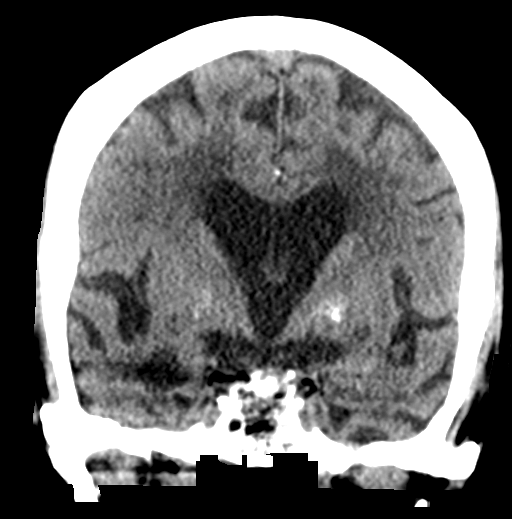

[Series 5: sagittal soft tissue · sagittal · 0.29mm/px · 3 of 49 slices shown]
[im 17/49  brain]
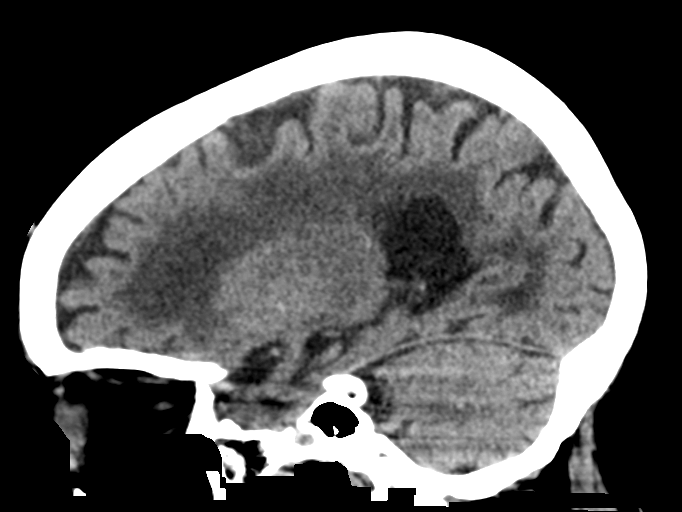
[im 25/49  brain]
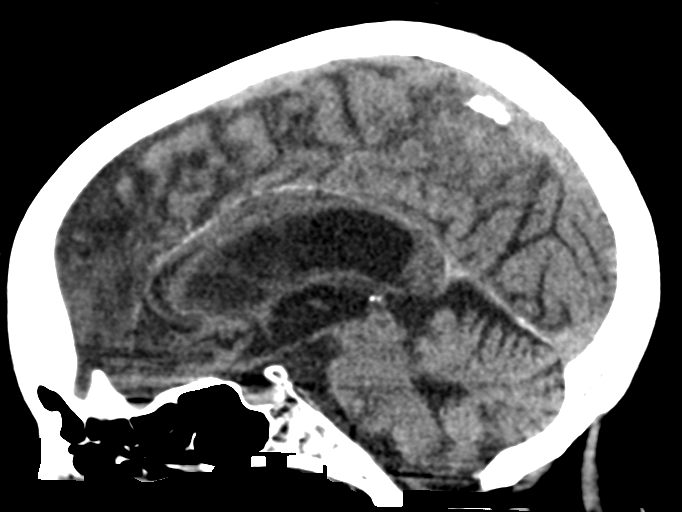
[im 33/49  brain]
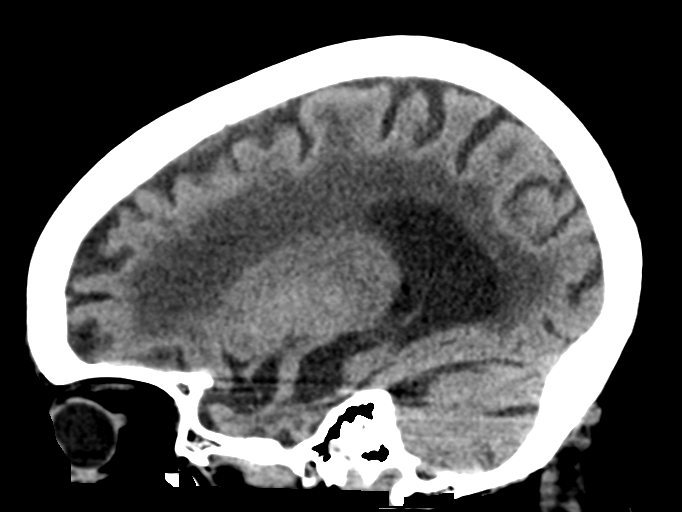

[15 of 47 positions shown; findings below may reference images not displayed]

FINDINGS: Brain: Severe cortical and deep atrophy and moderate cerebellar
atrophy, unchanged. Severe changes of small vessel disease of the
white matter diffusely, unchanged. Physiologic calcifications in the
basal ganglia bilaterally, unchanged. 5 mm calcified meningioma
involving the left frontal region, unchanged. No mass lesion. No
midline shift. No acute hemorrhage or hematoma. No extra-axial fluid
collections. No evidence of acute infarction.

Vascular: Moderate bilateral carotid siphon atherosclerosis. No
hyperdense vessel.

Skull: No skull fracture or other focal osseous abnormality
involving the skull.

Sinuses/Orbits: Mucous retention cyst or polyp in the anterior right
maxillary sinus with a small air-fluid level. Remaining visualized
paranasal sinuses, bilateral mastoid air cells and bilateral middle
ear cavities well-aerated.

Calcification involving the lens of the right eye, likely indicating
a cataract. Remote fracture involving the medial wall of the left
orbit is again noted

Other: None.
IMPRESSION: 1. No acute intracranial abnormality.
2. Stable severe generalized atrophy and severe chronic
microvascular ischemic changes of the white matter.
3. Stable incidental 5 mm calcified meningioma involving the left
frontal region.
